# Patient Record
Sex: Male | Born: 1980 | Race: Black or African American | Hispanic: No | Marital: Single | Smoking: Current every day smoker
Health system: Southern US, Community
[De-identification: ages and names within clinical notes are randomized; demographics above are authoritative.]

## PROBLEM LIST (undated history)

## (undated) DIAGNOSIS — R569 Unspecified convulsions: Secondary | ICD-10-CM

## (undated) DIAGNOSIS — F819 Developmental disorder of scholastic skills, unspecified: Secondary | ICD-10-CM

## (undated) DIAGNOSIS — J302 Other seasonal allergic rhinitis: Secondary | ICD-10-CM

## (undated) HISTORY — PX: HAND SURGERY: SHX662

## (undated) HISTORY — DX: Other seasonal allergic rhinitis: J30.2

## (undated) HISTORY — PX: VASECTOMY: SHX75

## (undated) HISTORY — DX: Developmental disorder of scholastic skills, unspecified: F81.9

---

## 2006-01-06 ENCOUNTER — Emergency Department: Payer: Self-pay | Admitting: General Practice

## 2008-06-19 ENCOUNTER — Emergency Department: Payer: Self-pay | Admitting: Emergency Medicine

## 2008-07-09 ENCOUNTER — Emergency Department: Payer: Self-pay | Admitting: Emergency Medicine

## 2008-11-30 ENCOUNTER — Emergency Department: Payer: Self-pay | Admitting: Emergency Medicine

## 2012-01-09 ENCOUNTER — Emergency Department: Payer: Self-pay | Admitting: Emergency Medicine

## 2012-01-11 ENCOUNTER — Emergency Department: Payer: Self-pay | Admitting: Emergency Medicine

## 2012-01-14 ENCOUNTER — Emergency Department: Payer: Self-pay | Admitting: *Deleted

## 2012-08-24 ENCOUNTER — Emergency Department: Payer: Self-pay | Admitting: Internal Medicine

## 2012-08-24 LAB — COMPREHENSIVE METABOLIC PANEL
Anion Gap: 6 — ABNORMAL LOW (ref 7–16)
Calcium, Total: 9.2 mg/dL (ref 8.5–10.1)
Chloride: 108 mmol/L — ABNORMAL HIGH (ref 98–107)
Creatinine: 0.95 mg/dL (ref 0.60–1.30)
EGFR (African American): >60
EGFR (Non-African Amer.): >60
Potassium: 3.8 mmol/L (ref 3.5–5.1)
SGOT(AST): 27 U/L (ref 15–37)
Sodium: 142 mmol/L (ref 136–145)

## 2012-08-24 LAB — DRUG SCREEN, URINE
Amphetamines, Ur Screen: NEGATIVE (ref ?–1000)
Cocaine Metabolite,Ur ~~LOC~~: NEGATIVE (ref ?–300)
MDMA (Ecstasy)Ur Screen: NEGATIVE (ref ?–500)
Methadone, Ur Screen: NEGATIVE (ref ?–300)
Opiate, Ur Screen: NEGATIVE (ref ?–300)
Tricyclic, Ur Screen: NEGATIVE (ref ?–1000)

## 2012-08-24 LAB — ETHANOL
Ethanol %: <0.003 % (ref 0.000–0.080)
Ethanol: <3 mg/dL

## 2012-08-24 LAB — CBC
HCT: 43.9 % (ref 40.0–52.0)
HGB: 15.4 g/dL (ref 13.0–18.0)
MCH: 29.4 pg (ref 26.0–34.0)
MCHC: 35 g/dL (ref 32.0–36.0)
MCV: 84 fL (ref 80–100)
RDW: 12.3 % (ref 11.5–14.5)
WBC: 5.8 10*3/uL (ref 3.8–10.6)

## 2012-08-24 LAB — TROPONIN I: Troponin-I: <0.02 ng/mL

## 2013-09-11 ENCOUNTER — Ambulatory Visit: Payer: Self-pay | Admitting: Student

## 2013-09-11 ENCOUNTER — Emergency Department: Payer: Self-pay | Admitting: Emergency Medicine

## 2013-09-11 LAB — CBC
MCH: 29.5 pg (ref 26.0–34.0)
MCHC: 34.6 g/dL (ref 32.0–36.0)
MCV: 85 fL (ref 80–100)
Platelet: 250 10*3/uL (ref 150–440)
RBC: 4.69 10*6/uL (ref 4.40–5.90)
RDW: 12.5 % (ref 11.5–14.5)
WBC: 7.8 10*3/uL (ref 3.8–10.6)

## 2013-09-11 LAB — BASIC METABOLIC PANEL
Anion Gap: 4 — ABNORMAL LOW (ref 7–16)
Calcium, Total: 8.3 mg/dL — ABNORMAL LOW (ref 8.5–10.1)
Chloride: 111 mmol/L — ABNORMAL HIGH (ref 98–107)
Co2: 29 mmol/L (ref 21–32)
Creatinine: 1.24 mg/dL (ref 0.60–1.30)
EGFR (African American): >60
Osmolality: 286 (ref 275–301)
Potassium: 3.7 mmol/L (ref 3.5–5.1)
Sodium: 144 mmol/L (ref 136–145)

## 2013-10-10 ENCOUNTER — Emergency Department: Payer: Self-pay | Admitting: Emergency Medicine

## 2013-10-10 LAB — COMPREHENSIVE METABOLIC PANEL
AST: 22 U/L (ref 15–37)
Albumin: 4.3 g/dL (ref 3.4–5.0)
Alkaline Phosphatase: 104 U/L
Anion Gap: 5 — ABNORMAL LOW (ref 7–16)
BUN: 10 mg/dL (ref 7–18)
Bilirubin,Total: 0.4 mg/dL (ref 0.2–1.0)
Calcium, Total: 8.8 mg/dL (ref 8.5–10.1)
Chloride: 108 mmol/L — ABNORMAL HIGH (ref 98–107)
Co2: 26 mmol/L (ref 21–32)
Creatinine: 0.96 mg/dL (ref 0.60–1.30)
EGFR (Non-African Amer.): >60
Glucose: 82 mg/dL (ref 65–99)
OSMOLALITY: 276 (ref 275–301)
Potassium: 3.9 mmol/L (ref 3.5–5.1)
SGPT (ALT): 16 U/L (ref 12–78)
SODIUM: 139 mmol/L (ref 136–145)
Total Protein: 7.7 g/dL (ref 6.4–8.2)

## 2013-10-10 LAB — LIPASE, BLOOD: LIPASE: 122 U/L (ref 73–393)

## 2013-10-10 LAB — CBC
HCT: 40.2 % (ref 40.0–52.0)
HGB: 13.8 g/dL (ref 13.0–18.0)
MCH: 29.3 pg (ref 26.0–34.0)
MCHC: 34.4 g/dL (ref 32.0–36.0)
MCV: 85 fL (ref 80–100)
PLATELETS: 267 10*3/uL (ref 150–440)
RBC: 4.72 10*6/uL (ref 4.40–5.90)
RDW: 12.4 % (ref 11.5–14.5)
WBC: 3.5 10*3/uL — ABNORMAL LOW (ref 3.8–10.6)

## 2013-10-10 LAB — DRUG SCREEN, URINE

## 2013-10-10 LAB — TROPONIN I: Troponin-I: <0.02 ng/mL

## 2013-10-10 LAB — PHENYTOIN LEVEL, TOTAL: Dilantin: 2.6 ug/mL — ABNORMAL LOW (ref 10.0–20.0)

## 2013-12-31 ENCOUNTER — Emergency Department: Payer: Self-pay | Admitting: Internal Medicine

## 2013-12-31 LAB — CBC
HCT: 39.5 % — AB (ref 40.0–52.0)
HGB: 13.5 g/dL (ref 13.0–18.0)
MCH: 28.8 pg (ref 26.0–34.0)
MCHC: 34.1 g/dL (ref 32.0–36.0)
MCV: 85 fL (ref 80–100)
Platelet: 241 10*3/uL (ref 150–440)
RBC: 4.67 10*6/uL (ref 4.40–5.90)
RDW: 12.8 % (ref 11.5–14.5)
WBC: 5.4 10*3/uL (ref 3.8–10.6)

## 2013-12-31 LAB — COMPREHENSIVE METABOLIC PANEL
ALBUMIN: 4 g/dL (ref 3.4–5.0)
ALT: 18 U/L (ref 12–78)
Alkaline Phosphatase: 60 U/L
Anion Gap: 4 — ABNORMAL LOW (ref 7–16)
BUN: 9 mg/dL (ref 7–18)
Bilirubin,Total: 0.4 mg/dL (ref 0.2–1.0)
CHLORIDE: 107 mmol/L (ref 98–107)
CREATININE: 0.95 mg/dL (ref 0.60–1.30)
Calcium, Total: 8.6 mg/dL (ref 8.5–10.1)
Co2: 27 mmol/L (ref 21–32)
EGFR (African American): >60
EGFR (Non-African Amer.): >60
Glucose: 76 mg/dL (ref 65–99)
OSMOLALITY: 273 (ref 275–301)
Potassium: 3.8 mmol/L (ref 3.5–5.1)
SGOT(AST): 29 U/L (ref 15–37)
SODIUM: 138 mmol/L (ref 136–145)
Total Protein: 7.3 g/dL (ref 6.4–8.2)

## 2013-12-31 LAB — PHENYTOIN LEVEL, TOTAL: Dilantin: 0.6 ug/mL — ABNORMAL LOW (ref 10.0–20.0)

## 2013-12-31 LAB — ETHANOL
Ethanol %: <0.003 % (ref 0.000–0.080)
Ethanol: <3 mg/dL

## 2014-01-31 ENCOUNTER — Emergency Department: Payer: Self-pay | Admitting: Emergency Medicine

## 2014-01-31 LAB — COMPREHENSIVE METABOLIC PANEL
ALBUMIN: 4.3 g/dL (ref 3.4–5.0)
ALK PHOS: 74 U/L
Anion Gap: 3 — ABNORMAL LOW (ref 7–16)
BUN: 10 mg/dL (ref 7–18)
Bilirubin,Total: 0.2 mg/dL (ref 0.2–1.0)
CO2: 30 mmol/L (ref 21–32)
CREATININE: 1.16 mg/dL (ref 0.60–1.30)
Calcium, Total: 8.8 mg/dL (ref 8.5–10.1)
Chloride: 105 mmol/L (ref 98–107)
EGFR (African American): >60
EGFR (Non-African Amer.): >60
Glucose: 73 mg/dL (ref 65–99)
OSMOLALITY: 273 (ref 275–301)
Potassium: 3.8 mmol/L (ref 3.5–5.1)
SGOT(AST): 24 U/L (ref 15–37)
SGPT (ALT): 16 U/L (ref 12–78)
Sodium: 138 mmol/L (ref 136–145)
TOTAL PROTEIN: 7.5 g/dL (ref 6.4–8.2)

## 2014-01-31 LAB — CBC WITH DIFFERENTIAL/PLATELET
BASOS ABS: 0 10*3/uL (ref 0.0–0.1)
Basophil %: 1.1 %
Eosinophil #: 0.2 10*3/uL (ref 0.0–0.7)
Eosinophil %: 5.5 %
HCT: 39.3 % — ABNORMAL LOW (ref 40.0–52.0)
HGB: 13.2 g/dL (ref 13.0–18.0)
LYMPHS ABS: 1.2 10*3/uL (ref 1.0–3.6)
LYMPHS PCT: 35 %
MCH: 28.4 pg (ref 26.0–34.0)
MCHC: 33.5 g/dL (ref 32.0–36.0)
MCV: 85 fL (ref 80–100)
Monocyte #: 0.4 x10 3/mm (ref 0.2–1.0)
Monocyte %: 10.2 %
NEUTROS PCT: 48.2 %
Neutrophil #: 1.7 10*3/uL (ref 1.4–6.5)
PLATELETS: 215 10*3/uL (ref 150–440)
RBC: 4.64 10*6/uL (ref 4.40–5.90)
RDW: 12.8 % (ref 11.5–14.5)
WBC: 3.5 10*3/uL — ABNORMAL LOW (ref 3.8–10.6)

## 2014-01-31 LAB — PHENYTOIN LEVEL, TOTAL: Dilantin: <0.4 ug/mL — ABNORMAL LOW (ref 10.0–20.0)

## 2014-02-21 ENCOUNTER — Emergency Department: Payer: Self-pay | Admitting: Emergency Medicine

## 2014-02-21 LAB — COMPREHENSIVE METABOLIC PANEL
ALBUMIN: 3.6 g/dL (ref 3.4–5.0)
ANION GAP: 7 (ref 7–16)
Alkaline Phosphatase: 59 U/L
BUN: 10 mg/dL (ref 7–18)
Bilirubin,Total: 0.5 mg/dL (ref 0.2–1.0)
CALCIUM: 8.6 mg/dL (ref 8.5–10.1)
Chloride: 107 mmol/L (ref 98–107)
Co2: 25 mmol/L (ref 21–32)
Creatinine: 0.9 mg/dL (ref 0.60–1.30)
EGFR (African American): >60
EGFR (Non-African Amer.): >60
Glucose: 101 mg/dL — ABNORMAL HIGH (ref 65–99)
Osmolality: 277 (ref 275–301)
POTASSIUM: 3.5 mmol/L (ref 3.5–5.1)
SGOT(AST): 21 U/L (ref 15–37)
SGPT (ALT): 16 U/L (ref 12–78)
Sodium: 139 mmol/L (ref 136–145)
TOTAL PROTEIN: 6.6 g/dL (ref 6.4–8.2)

## 2014-02-21 LAB — CBC
HCT: 37.3 % — ABNORMAL LOW (ref 40.0–52.0)
HGB: 12.5 g/dL — ABNORMAL LOW (ref 13.0–18.0)
MCH: 28.6 pg (ref 26.0–34.0)
MCHC: 33.5 g/dL (ref 32.0–36.0)
MCV: 85 fL (ref 80–100)
Platelet: 216 10*3/uL (ref 150–440)
RBC: 4.37 10*6/uL — ABNORMAL LOW (ref 4.40–5.90)
RDW: 12.8 % (ref 11.5–14.5)
WBC: 2.6 10*3/uL — ABNORMAL LOW (ref 3.8–10.6)

## 2014-02-21 LAB — PHENYTOIN LEVEL, TOTAL: Dilantin: 0.9 ug/mL — ABNORMAL LOW (ref 10.0–20.0)

## 2014-10-14 ENCOUNTER — Emergency Department (HOSPITAL_COMMUNITY)
Admission: EM | Admit: 2014-10-14 | Discharge: 2014-10-14 | Disposition: A | Discharge status: Home / Self Care | Payer: Self-pay | Attending: Emergency Medicine | Admitting: Emergency Medicine

## 2014-10-14 ENCOUNTER — Encounter (HOSPITAL_COMMUNITY): Payer: Self-pay | Admitting: *Deleted

## 2014-10-14 DIAGNOSIS — R569 Unspecified convulsions: Secondary | ICD-10-CM | POA: Insufficient documentation

## 2014-10-14 DIAGNOSIS — Z87891 Personal history of nicotine dependence: Secondary | ICD-10-CM | POA: Insufficient documentation

## 2014-10-14 HISTORY — DX: Unspecified convulsions: R56.9

## 2014-10-14 LAB — BASIC METABOLIC PANEL
ANION GAP: 6 (ref 5–15)
BUN: 9 mg/dL (ref 6–23)
CO2: 28 mmol/L (ref 19–32)
CREATININE: 1.1 mg/dL (ref 0.50–1.35)
Calcium: 9.4 mg/dL (ref 8.4–10.5)
Chloride: 104 mmol/L (ref 96–112)
GFR calc Af Amer: >90 mL/min (ref >90)
GFR calc non Af Amer: 87 mL/min — ABNORMAL LOW (ref >90)
GLUCOSE: 92 mg/dL (ref 70–99)
Potassium: 3.9 mmol/L (ref 3.5–5.1)
Sodium: 138 mmol/L (ref 135–145)

## 2014-10-14 LAB — CBC WITH DIFFERENTIAL/PLATELET
Basophils Absolute: 0 10*3/uL (ref 0.0–0.1)
Basophils Relative: 1 % (ref 0–1)
Eosinophils Absolute: 0.2 10*3/uL (ref 0.0–0.7)
Eosinophils Relative: 4 % (ref 0–5)
HCT: 36.6 % — ABNORMAL LOW (ref 39.0–52.0)
Hemoglobin: 12.5 g/dL — ABNORMAL LOW (ref 13.0–17.0)
Lymphocytes Relative: 41 % (ref 12–46)
Lymphs Abs: 1.7 10*3/uL (ref 0.7–4.0)
MCH: 28.3 pg (ref 26.0–34.0)
MCHC: 34.2 g/dL (ref 30.0–36.0)
MCV: 83 fL (ref 78.0–100.0)
Monocytes Absolute: 0.4 10*3/uL (ref 0.1–1.0)
Monocytes Relative: 10 % (ref 3–12)
Neutro Abs: 1.8 10*3/uL (ref 1.7–7.7)
Neutrophils Relative %: 44 % (ref 43–77)
Platelets: 236 10*3/uL (ref 150–400)
RBC: 4.41 MIL/uL (ref 4.22–5.81)
RDW: 11.7 % (ref 11.5–15.5)
WBC: 4 10*3/uL (ref 4.0–10.5)

## 2014-10-14 MED ORDER — SODIUM CHLORIDE 0.9 % IV BOLUS (SEPSIS)
1000.0000 mL | Freq: Once | INTRAVENOUS | Status: AC
  Administered 2014-10-14: 1000 mL via INTRAVENOUS

## 2014-10-14 MED ORDER — PHENYTOIN SODIUM EXTENDED 100 MG PO CAPS: 100.0000 mg | ORAL_CAPSULE | Freq: Three times a day (TID) | ORAL | Status: DC

## 2014-10-14 MED ORDER — SODIUM CHLORIDE 0.9 % IV SOLN
1000.0000 mg | Freq: Once | INTRAVENOUS | Status: AC
  Administered 2014-10-14: 1000 mg via INTRAVENOUS
  Filled 2014-10-14: qty 20

## 2014-10-14 NOTE — ED Provider Notes (Signed)
CSN: [id number]     Arrival date & time 10/14/14  1559 History   First MD Initiated Contact with Patient 10/14/14 1603     Chief Complaint  Patient presents with  . Seizures     HPI  Patient presents after a witnessed seizure. Patient states that he has been doing generally well.  He does acknowledge occasional black stool, though none today. Today, the patient drank 3 monster energy drinks, one cup of coffee. Subsequently, the patient had a typical prodrome, then a 10 minute seizure. Patient had a slightly more prolonged postictal phase, but otherwise, the seizure was characteristically typical for him, according to the patient's fianc. Currently the patient describes mild tenderness, without confusion, disorientation, tongue pain, weakness anywhere. He acknowledges a seizure history, states that he has been unable to obtain medication for at least 6 months. He denies other medical problems.   Past Medical History  Diagnosis Date  . Seizures    Past Surgical History  Procedure Laterality Date  . Vasectomy Bilateral    History reviewed. No pertinent family history. History  Substance Use Topics  . Smoking status: Former Smoker    Quit date: 07/18/2014  . Smokeless tobacco: Not on file  . Alcohol Use: Yes     Comment: socially    Review of Systems  Constitutional:       Per HPI, otherwise negative  HENT:       Per HPI, otherwise negative  Respiratory:       Per HPI, otherwise negative  Cardiovascular:       Per HPI, otherwise negative  Gastrointestinal: Negative for vomiting.  Endocrine:       Negative aside from HPI  Genitourinary:       Neg aside from HPI   Musculoskeletal:       Per HPI, otherwise negative  Skin: Negative.   Neurological: Positive for seizures. Negative for syncope.      Allergies  Review of patient's allergies indicates no known allergies.  Home Medications   Prior to Admission medications   Not on File   BP 131/94 mmHg  Pulse  90  Resp 20  SpO2 98% Physical Exam  Constitutional: He is oriented to person, place, and time. He appears well-developed. No distress.  HENT:  Head: Normocephalic and atraumatic.  Eyes: Conjunctivae and EOM are normal.  Pulmonary/Chest: Effort normal. No stridor. No respiratory distress.  Abdominal: He exhibits no distension.  Musculoskeletal: He exhibits no edema.  Neurological: He is alert and oriented to person, place, and time. No cranial nerve deficit. He exhibits normal muscle tone. Coordination normal.  Skin: Skin is warm and dry.  Psychiatric: He has a normal mood and affect.  Nursing note and vitals reviewed.   ED Course  Procedures (including critical care time) Labs Review Labs Reviewed  BASIC METABOLIC PANEL - Abnormal; Notable for the following:    GFR calc non Af Amer 87 (*)    All other components within normal limits  CBC WITH DIFFERENTIAL/PLATELET - Abnormal; Notable for the following:    Hemoglobin 12.5 (*)    HCT 36.6 (*)    All other components within normal limits      EKG Interpretation   Date/Time:  Thursday October 14 2014 16:02:27 EST Ventricular Rate:  69 PR Interval:  157 QRS Duration: 109 QT Interval:  387 QTC Calculation: 415 R Axis:   87 Text Interpretation:  Sinus rhythm Anterior infarct, possibly acute ST  elevation, consider inferior injury Lateral leads  are also involved  Baseline wander in lead(s) V3 V4 Sinus rhythm Artifact Abnormal ekg  Confirmed by Gerhard Munch  MD [phone]) on 10/14/2014 4:08:32 PM     On repeat exam the patient is in no distress.  Labs reviewed.  Mild anemia - PCP and GI f/u provided MDM  Patient presents after a witnessed seizure.  Patient has a history of seizures, has not taken any medication recently. With the patient's acknowledgment of recent black stool, labs were performed in addition to fluid resuscitation, loading of Dilantin. Patient was provided  Prescriptions, follow-up to ensure that he can  obtain his medication, and primary care resources.   Gerhard Munch, MD 10/14/14 1810

## 2014-10-14 NOTE — Discharge Instructions (Signed)
As discussed, your evaluation today has been largely reassuring.  But, it is important that you monitor your condition carefully, and do not hesitate to return to the ED if you develop new, or concerning changes in your condition.  Please take all medication as directed.  With your physician, please discuss both your seizures, and your borderline anemia.  Otherwise, please follow-up with your physicians (primary care and gastroenterology) for appropriate ongoing care.   Seizure, Adult A seizure is abnormal electrical activity in the brain. Seizures usually last from 30 seconds to 2 minutes. There are various types of seizures. Before a seizure, you may have a warning sensation (aura) that a seizure is about to occur. An aura may include the following symptoms:   Fear or anxiety.  Nausea.  Feeling like the room is spinning (vertigo).  Vision changes, such as seeing flashing lights or spots. Common symptoms during a seizure include:  A change in attention or behavior (altered mental status).  Convulsions with rhythmic jerking movements.  Drooling.  Rapid eye movements.  Grunting.  Loss of bladder and bowel control.  Bitter taste in the mouth.  Tongue biting. After a seizure, you may feel confused and sleepy. You may also have an injury resulting from convulsions during the seizure. HOME CARE INSTRUCTIONS   If you are given medicines, take them exactly as prescribed by your health care provider.  Keep all follow-up appointments as directed by your health care provider.  Do not swim or drive or engage in risky activity during which a seizure could cause further injury to you or others until your health care provider says it is OK.  Get adequate rest.  Teach friends and family what to do if you have a seizure. They should:  Lay you on the ground to prevent a fall.  Put a cushion under your head.  Loosen any tight clothing around your neck.  Turn you on your side. If  vomiting occurs, this helps keep your airway clear.  Stay with you until you recover.  Know whether or not you need emergency care. SEEK IMMEDIATE MEDICAL CARE IF:  The seizure lasts longer than 5 minutes.  The seizure is severe or you do not wake up immediately after the seizure.  You have an altered mental status after the seizure.  You are having more frequent or worsening seizures. Someone should drive you to the emergency department or call local emergency services (911 in [place] MAKE SURE YOU:  Understand these instructions.  Will watch your condition.  Will get help right away if you are not doing well or get worse. Document Released: 08/31/2000 Document Revised: 06/24/2013 Document Reviewed: 04/15/2013 [facility] Patient Information 2015 [place]. This information is not intended to replace advice given to you by your health care provider. Make sure you discuss any questions you have with your health care provider.
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
## 2014-10-14 NOTE — Progress Notes (Signed)
  CARE MANAGEMENT ED NOTE 10/14/2014  Patient:  Idelle CrouchRICHMOND,Jatorian C   Account Number:  [id number]  Date Initiated:  10/14/2014  Documentation initiated by:  Radford PaxFERRERO,Haillee Johann  Subjective/Objective Assessment:   Patient presents to Ed post seizure.     Subjective/Objective Assessment Detail:   Patient with pmhx of seizures     Action/Plan:   Action/Plan Detail:   Anticipated DC Date:  10/14/2014     Status Recommendation to Physician:   Result of Recommendation:    Other ED Services  Consult Working Plan    DC Planning Services  Other  PCP issues    Choice offered to / List presented to:            Status of service:  Completed, signed off  ED Comments:   ED Comments Detail:  EDCM spoke to patient at bedside.  Patient reports he does not have a pcp, insurnace or a neurologist.  Patient reports he hasn't taken his dilantin in six months, "Because I haven't had the funds."  Patient has just started a job, and will be receiving insurance in 90 days. Patient's fiance' at bedside, Marcelle Smilingatasha [phone]. Patient nor his fiance' are able to tell [facility] exact dosage of last Dilantin prescription.  However, patient's fiance' reports he last [facility] [place].  EDCM received patient's permission to call [facility].  EDCM [facility] [place] [facility] at [facility] who confirms patient [facility] [place] for Dilantin 100mg  3 capsules at bedtime.  Last prescription was filled December 30, 2013.  Discussed patient with Dr. Jeraldine LootsLockwood and EDRN.  Patient [facility]able for [facility] to [facility] in attempts to make him an appointment.  EDCM also provided patient with pamphlet to [facility], list of discounted pharmacies and websites needymeds.org and Good [email or web address].[email or web address] for medication assistance.  Patient and patient's fiance' thankful for services.  No further needs at this time.

## 2014-10-14 NOTE — ED Notes (Signed)
Bed: WA10 Expected date:  Expected time:  Means of arrival:  Comments: EMS 

## 2014-10-14 NOTE — Progress Notes (Signed)
10/14/2014 A. Daryle Amis RNCM 1829pm EDCM provided patient with discount coupon [facility]  Patient agreeable to copay.  No further EDCM needs at this time.

## 2014-10-15 LAB — CBG MONITORING, ED: GLUCOSE-CAPILLARY: 90 mg/dL (ref 70–99)

## 2014-11-25 ENCOUNTER — Emergency Department: Payer: Self-pay | Admitting: Emergency Medicine

## 2014-11-27 ENCOUNTER — Emergency Department (HOSPITAL_COMMUNITY)
Admission: EM | Admit: 2014-11-27 | Discharge: 2014-11-27 | Disposition: A | Discharge status: Home / Self Care | Payer: Self-pay | Attending: Emergency Medicine | Admitting: Emergency Medicine

## 2014-11-27 ENCOUNTER — Encounter (HOSPITAL_COMMUNITY): Payer: Self-pay | Admitting: *Deleted

## 2014-11-27 DIAGNOSIS — R569 Unspecified convulsions: Secondary | ICD-10-CM

## 2014-11-27 DIAGNOSIS — G43909 Migraine, unspecified, not intractable, without status migrainosus: Secondary | ICD-10-CM | POA: Insufficient documentation

## 2014-11-27 DIAGNOSIS — Z87891 Personal history of nicotine dependence: Secondary | ICD-10-CM | POA: Insufficient documentation

## 2014-11-27 DIAGNOSIS — Z79899 Other long term (current) drug therapy: Secondary | ICD-10-CM | POA: Insufficient documentation

## 2014-11-27 LAB — BASIC METABOLIC PANEL
Anion gap: 7 (ref 5–15)
BUN: 11 mg/dL (ref 6–23)
CO2: 27 mmol/L (ref 19–32)
Calcium: 8.8 mg/dL (ref 8.4–10.5)
Chloride: 105 mmol/L (ref 96–112)
Creatinine, Ser: 1.14 mg/dL (ref 0.50–1.35)
GFR calc non Af Amer: 84 mL/min — ABNORMAL LOW (ref >90)
GLUCOSE: 90 mg/dL (ref 70–99)
Potassium: 4.4 mmol/L (ref 3.5–5.1)
Sodium: 139 mmol/L (ref 135–145)

## 2014-11-27 LAB — CBC
HCT: 37.7 % — ABNORMAL LOW (ref 39.0–52.0)
HEMOGLOBIN: 13.1 g/dL (ref 13.0–17.0)
MCH: 28.9 pg (ref 26.0–34.0)
MCHC: 34.7 g/dL (ref 30.0–36.0)
MCV: 83.2 fL (ref 78.0–100.0)
Platelets: 203 10*3/uL (ref 150–400)
RBC: 4.53 MIL/uL (ref 4.22–5.81)
RDW: 11.9 % (ref 11.5–15.5)
WBC: 3.5 10*3/uL — ABNORMAL LOW (ref 4.0–10.5)

## 2014-11-27 LAB — PHENYTOIN LEVEL, TOTAL: Phenytoin Lvl: 19.5 ug/mL (ref 10.0–20.0)

## 2014-11-27 NOTE — ED Provider Notes (Signed)
CSN: [id number]     Arrival date & time 11/27/14  1126 History   First MD Initiated Contact with Patient 11/27/14 1128     Chief Complaint  Patient presents with  . Seizures     (Consider location/radiation/quality/duration/timing/severity/associated sxs/prior Treatment) Patient is a 34 y.o. male presenting with seizures. The history is provided by the patient.  Seizures Seizure activity on arrival: no   Seizure type:  Grand mal Preceding symptoms: aura and dizziness   Initial focality:  None Episode characteristics: no apnea, no focal shaking, no generalized shaking and responsive   Postictal symptoms: no confusion   Severity:  Mild Timing:  Once Context: not alcohol withdrawal, not cerebral palsy, not fever and not previous head injury     Past Medical History  Diagnosis Date  . Seizures    Past Surgical History  Procedure Laterality Date  . Vasectomy Bilateral    No family history on file. History  Substance Use Topics  . Smoking status: Former Smoker    Quit date: 07/18/2014  . Smokeless tobacco: Not on file  . Alcohol Use: Yes     Comment: socially    Review of Systems  Constitutional: Negative for fever.  Respiratory: Negative for cough and shortness of breath.   Cardiovascular: Negative for chest pain and leg swelling.  Gastrointestinal: Negative for vomiting and abdominal pain.  Neurological: Positive for seizures.  All other systems reviewed and are negative.     Allergies  Review of patient's allergies indicates no known allergies.  Home Medications   Prior to Admission medications   Medication Sig Start Date End Date Taking? Authorizing Provider  phenytoin (DILANTIN) 100 MG ER capsule Take 1 capsule (100 mg total) by mouth 3 (three) times daily. 10/14/14  Yes Gerhard Munch, MD   BP 132/97 mmHg  Pulse 72  Temp(Src) 97.8 F (36.6 C) (Oral)  Resp 16  SpO2 100% Physical Exam  Constitutional: He is oriented to person, place, and time. He  appears well-developed and well-nourished. No distress.  HENT:  Head: Normocephalic and atraumatic.  Mouth/Throat: No oropharyngeal exudate.  Eyes: EOM are normal. Pupils are equal, round, and reactive to light.  Neck: Normal range of motion. Neck supple.  Cardiovascular: Normal rate and regular rhythm.  Exam reveals no friction rub.   No murmur heard. Pulmonary/Chest: Effort normal and breath sounds normal. No respiratory distress. He has no wheezes. He has no rales.  Abdominal: He exhibits no distension. There is no tenderness. There is no rebound.  Musculoskeletal: Normal range of motion. He exhibits no edema.  Neurological: He is alert and oriented to person, place, and time. A sensory deficit (altered light touch on R leg) is present. No cranial nerve deficit. He exhibits normal muscle tone. Coordination and gait normal. GCS eye subscore is 4. GCS verbal subscore is 5. GCS motor subscore is 6.  Skin: He is not diaphoretic.  Nursing note and vitals reviewed.   ED Course  Procedures (including critical care time) Labs Review Labs Reviewed  CBC  BASIC METABOLIC PANEL  PHENYTOIN LEVEL, TOTAL    Imaging Review No results found.   EKG Interpretation   Date/Time:  Saturday November 27 2014 11:52:47 EST Ventricular Rate:  65 PR Interval:  150 QRS Duration: 106 QT Interval:  398 QTC Calculation: 414 R Axis:   78 Text Interpretation:  Sinus rhythm ST elev, probable normal early repol  pattern Similar to prior Confirmed by [facility]  MD, Shrihan Putt ([id number]) on  11/27/2014 11:59:56 AM  MDM   Final diagnoses:  Seizure    76M here with seizures. Hx of chronic seizures. On/off dilantin intermittently. Just restarted dilantin 2 days ago after having 2 seizures. Had another 2 grand mal seizures today. No fevers, vomiting.  Seizure frequency prior to going back on dilantin was about once every 3 months. AFVSS here. Dilantin level therapeutic. Other labs ok. Given Neuro f/u. Stable for  discharge.    Elwin MochaBlair Glenda Kunst, MD 11/27/14 [phone]

## 2014-11-27 NOTE — ED Notes (Signed)
Bed: WA10 Expected date: 11/27/14 Expected time: 11:26 AM Means of arrival: Ambulance Comments: Seizure

## 2014-11-27 NOTE — ED Notes (Signed)
Per EMS pt with hx of seizures, had two prior to arrival. EMS upon their arrival pt was A+O x 4. Pt was ambulatory to department.

## 2014-11-27 NOTE — Discharge Instructions (Signed)
Nonepileptic Seizures °Nonepileptic seizures are seizures that are not caused by abnormal electrical signals in your brain. These seizures often seem like epileptic seizures, but they are not caused by epilepsy.  °There are two types of nonepileptic seizures: °· A physiologic nonepileptic seizure results from a disruption in your brain. °· A psychogenic seizure results from emotional stress. These seizures are sometimes called pseudoseizures. °CAUSES  °Causes of physiologic nonepileptic seizures include:  °· Sudden drop in blood pressure. °· Low blood sugar. °· Low levels of salt (sodium) in your blood. °· Low levels of calcium in your blood. °· Migraine. °· Heart rhythm problems. °· Sleep disorders. °· Drug and alcohol abuse. °Common causes of psychogenic nonepileptic seizures include: °· Stress. °· Emotional trauma. °· Sexual or physical abuse. °· Major life events, such as divorce or the death of a loved one. °· Mental health disorders, including panic attack and hyperactivity disorder. °SIGNS AND SYMPTOMS °A nonepileptic seizure can look like an epileptic seizure, including uncontrollable shaking (convulsions), or changes in attention, behavior, or the ability to remain awake and alert. However, there are some differences. Nonepileptic seizures usually: °· Do not cause physical injuries. °· Start slowly. °· Include crying or shrieking. °· Last longer than 2 minutes. °· Have a short recovery time without headache or exhaustion. °DIAGNOSIS  °Your health care provider can usually diagnose nonepileptic seizures after taking your medical history and giving you a physical exam. Your health care provider may want to talk to your friends or relatives who have seen you have a seizure.  °You may also need to have tests to look for causes of physiologic nonepileptic seizures. This may include an electroencephalogram (EEG), which is a test that measures electrical activity in your brain. If you have had an epileptic  seizure, the results of your EEG will be abnormal. If your health care provider thinks you have had a psychogenic nonepileptic seizure, you may need to see a mental health specialist for an evaluation. °TREATMENT  °Treatment depends on the type and cause of your seizures. °· For physiologic nonepileptic seizures, treatment is aimed at addressing the underlying condition that caused the seizures. These seizures usually stop when the underlying condition is properly treated. °· Nonepileptic seizures do not respond to the seizure medicines used to treat epilepsy. °· For psychogenic seizures, you may need to work with a mental health specialist. °HOME CARE INSTRUCTIONS °Home care will depend on the type of nonepileptic seizures you have.  °· Follow all your health care provider's instructions. °· Keep all your follow-up appointments. °SEEK MEDICAL CARE IF: °You continue to have seizures after treatment. °SEEK IMMEDIATE MEDICAL CARE IF: °· Your seizures change or become more frequent. °· You injure yourself during a seizure. °· You have one seizure after another. °· You have trouble recovering from a seizure. °· You have chest pain or trouble breathing. °MAKE SURE YOU: °· Understand these instructions. °· Will watch your condition. °· Will get help right away if you are not doing well or get worse. °Document Released: 10/19/2005 Document Revised: 01/18/2014 Document Reviewed: 06/30/2013 °ExitCare® Patient Information ©2015 ExitCare, LLC. This information is not intended to replace advice given to you by your health care provider. Make sure you discuss any questions you have with your health care provider. ° °

## 2014-12-07 ENCOUNTER — Ambulatory Visit: Payer: Self-pay | Admitting: Neurology

## 2014-12-08 ENCOUNTER — Encounter: Payer: Self-pay | Admitting: Neurology
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
## 2015-05-11 ENCOUNTER — Emergency Department (HOSPITAL_COMMUNITY)
Admission: EM | Admit: 2015-05-11 | Discharge: 2015-05-11 | Disposition: A | Discharge status: Home / Self Care | Payer: BLUE CROSS/BLUE SHIELD | Attending: Emergency Medicine | Admitting: Emergency Medicine

## 2015-05-11 ENCOUNTER — Encounter (HOSPITAL_COMMUNITY): Payer: Self-pay | Admitting: *Deleted

## 2015-05-11 DIAGNOSIS — Z79899 Other long term (current) drug therapy: Secondary | ICD-10-CM | POA: Diagnosis not present

## 2015-05-11 DIAGNOSIS — Z87891 Personal history of nicotine dependence: Secondary | ICD-10-CM | POA: Insufficient documentation

## 2015-05-11 DIAGNOSIS — K0381 Cracked tooth: Secondary | ICD-10-CM | POA: Insufficient documentation

## 2015-05-11 DIAGNOSIS — K029 Dental caries, unspecified: Secondary | ICD-10-CM | POA: Diagnosis not present

## 2015-05-11 DIAGNOSIS — K088 Other specified disorders of teeth and supporting structures: Secondary | ICD-10-CM | POA: Diagnosis present

## 2015-05-11 DIAGNOSIS — K625 Hemorrhage of anus and rectum: Secondary | ICD-10-CM | POA: Insufficient documentation

## 2015-05-11 DIAGNOSIS — G40909 Epilepsy, unspecified, not intractable, without status epilepticus: Secondary | ICD-10-CM | POA: Diagnosis not present

## 2015-05-11 DIAGNOSIS — K0889 Other specified disorders of teeth and supporting structures: Secondary | ICD-10-CM

## 2015-05-11 MED ORDER — LEVETIRACETAM 500 MG PO TABS: 500.0000 mg | ORAL_TABLET | Freq: Two times a day (BID) | ORAL | Status: DC

## 2015-05-11 MED ORDER — LORAZEPAM 1 MG PO TABS
1.0000 mg | ORAL_TABLET | Freq: Once | ORAL | Status: AC
  Administered 2015-05-11: 1 mg via ORAL
  Filled 2015-05-11: qty 1

## 2015-05-11 MED ORDER — PENICILLIN V POTASSIUM 500 MG PO TABS: 500.0000 mg | ORAL_TABLET | Freq: Four times a day (QID) | ORAL | Status: DC

## 2015-05-11 MED ORDER — LEVETIRACETAM 500 MG PO TABS
1000.0000 mg | ORAL_TABLET | Freq: Once | ORAL | Status: AC
  Administered 2015-05-11: 1000 mg via ORAL
  Filled 2015-05-11: qty 2

## 2015-05-11 NOTE — ED Notes (Signed)
Pt d/c'd on downtime.  

## 2015-05-11 NOTE — ED Notes (Signed)
Per EDPA pt describing dizziness/head pain at this time which is typical of his seizures.  No sz activity noted.  Pt denies other complaints at this time.  Will CTM.

## 2015-05-11 NOTE — ED Notes (Signed)
Pt complains of left lower toothache, lightheadedness since this morning. Pt states he noticed he was spitting blood this morning, pt is unsure if the blood is from his tooth.

## 2015-05-11 NOTE — ED Provider Notes (Signed)
CSN: [id number]     Arrival date & time 05/11/15  1218 History  This chart was scribed for non-physician practitioner, Trixie Dredge, PA-C working with Arby Barrette, MD by Placido Sou, ED scribe. This patient was seen in room WTR5/WTR5 and the patient's care was started at 2:11 PM.   Chief Complaint  Patient presents with  . Dental Pain   The history is provided by the patient and the spouse. No language interpreter was used.    HPI Comments: Keith Chung is a 34 y.o. male, with a hx of epilepsy, who presents to the Emergency Department complaining of chronic, moderate, left lower dental pain with worsening symptoms beginning this morning.   He notes 1x spitting blood out of his mouth this morning with associated increased left lower molar pain, followed by a feeling of mild lightheadedness and HA this morning which he says are typical before he has seizures. Pt notes that he has experienced seizures since he was 34 years old, had his last episode 2 months ago which involved some mild "eye fluttering" and drooling and further notes that he hasn't seen a neurologist for his symptoms. His wife also notes that sometimes his full body will convulse. He notes taking dilantin for his seizures but only takes it every third day because it makes him too sleepy to work - he works in [employer].  He denies facial swelling, fevers, chills, difficulty swallowing, CP, SOB, cough, hemoptysis, hematuria and abd pain.   Past Medical History  Diagnosis Date  . Seizures    Past Surgical History  Procedure Laterality Date  . Vasectomy Bilateral    No family history on file. Social History  Substance Use Topics  . Smoking status: Former Smoker    Quit date: 07/18/2014  . Smokeless tobacco: None  . Alcohol Use: Yes     Comment: socially    Review of Systems  Constitutional: Negative for fever and chills.  HENT: Positive for dental problem. Negative for facial swelling and trouble swallowing.    Respiratory: Negative for cough and shortness of breath.   Cardiovascular: Negative for chest pain.  Gastrointestinal: Positive for anal bleeding (occasional blood on toilet paper only). Negative for abdominal pain.  Genitourinary: Negative for hematuria.  Neurological: Positive for light-headedness and headaches.  All other systems reviewed and are negative.   Allergies  Review of patient's allergies indicates no known allergies.  Home Medications   Prior to Admission medications   Medication Sig Start Date End Date Taking? Authorizing Provider  phenytoin (DILANTIN) 100 MG ER capsule Take 1 capsule (100 mg total) by mouth 3 (three) times daily. 10/14/14   Gerhard Munch, MD   BP 142/84 mmHg  Pulse 63  Temp(Src) 98.3 F (36.8 C) (Oral)  Resp 18  SpO2 97% Physical Exam  Constitutional: He appears well-developed and well-nourished. No distress.  HENT:  Head: Normocephalic and atraumatic.  Mouth/Throat: Uvula is midline and oropharynx is clear and moist. Mucous membranes are not dry. No uvula swelling. No oropharyngeal exudate, posterior oropharyngeal edema, posterior oropharyngeal erythema or tonsillar abscesses.  Left lower 1st molar with remote fracture and decay; left lower 2nd and 3rd molar with severe decay down to the gumline; no erythema, edema or fluctuance associated with gingiva or adjacent to teeth; no facial swelling  Neck: Normal range of motion. Neck supple.  Cardiovascular: Normal rate and regular rhythm.   Pulmonary/Chest: Effort normal and breath sounds normal. No stridor. No respiratory distress. He has no wheezes. He has  no rales.  Abdominal: Soft. He exhibits no distension and no mass. There is no tenderness. There is no rebound and no guarding.  Lymphadenopathy:    He has no cervical adenopathy.  Neurological: He is alert. He exhibits normal muscle tone.  CN II-XII intact, EOMs intact, no pronator drift, grip strengths equal bilaterally; strength 5/5 in all  extremities, sensation intact in all extremities; finger to nose, heel to shin, rapid alternating movements normal; gait is normal.    Skin: He is not diaphoretic.  Nursing note and vitals reviewed.  ED Course  Procedures  DIAGNOSTIC STUDIES: Oxygen Saturation is 97% on RA, normal by my interpretation.    COORDINATION OF CARE: 2:26 PM Discussed treatment plan with pt at bedside and pt agreed to plan.  Labs Review Labs Reviewed - No data to display  Imaging Review No results found. I have personally reviewed and evaluated these images and lab results as part of my medical decision-making.   EKG Interpretation None       2:46 PM I spoke with Dr Roseanne Reno (neurology) who agrees with change from dilantin to keppra.  Will give 1000mg  PO in ED and pt to start 500mg  BID starting tonight per his recommendations.  Pt will also be given neurology follow up.   MDM   Final diagnoses:  Pain, dental  Seizure disorder    Afebrile, nontoxic patient with new dental pain.  No obvious abscess.  No concerning findings on exam. Doubt deep space head or neck infection.  Doubt Ludwig's angina.  Pt also with aura for seizure - no recent seizures.  Does not take medication as directed because it makes him sleepy and he works in [employer].  Switched to Keppra per discussion with Dr Roseanne Reno, see above.  D/C home with antibiotic, Keppra, (D/C Dilantin) and dental, neurology follow up.  Discussed findings, treatment, and follow up  with patient.  Pt given return precautions.  Pt verbalizes understanding and agrees with plan.       I personally performed the services described in this documentation, which was scribed in my presence. The recorded information has been reviewed and is accurate.    Trixie Dredge, PA-C 05/11/15 1740  Arby Barrette, MD 05/25/15 [phone]

## 2015-05-11 NOTE — Discharge Instructions (Signed)
Read the information below.  Use the prescribed medication as directed.  Please discuss all new medications with your pharmacist.  You may return to the Emergency Department at any time for worsening condition or any new symptoms that concern you.  Please call the dentist listed above within 48 hours to schedule a close follow up appointment.  If you develop fevers, swelling in your face, difficulty swallowing or breathing, return to the ER immediately for a recheck.    Dental Pain A tooth ache may be caused by cavities (tooth decay). Cavities expose the nerve of the tooth to air and hot or cold temperatures. It may come from an infection or abscess (also called a boil or furuncle) around your tooth. It is also often caused by dental caries (tooth decay). This causes the pain you are having. DIAGNOSIS  Your caregiver can diagnose this problem by exam. TREATMENT   If caused by an infection, it may be treated with medications which kill germs (antibiotics) and pain medications as prescribed by your caregiver. Take medications as directed.  Only take over-the-counter or prescription medicines for pain, discomfort, or fever as directed by your caregiver.  Whether the tooth ache today is caused by infection or dental disease, you should see your dentist as soon as possible for further care. SEEK MEDICAL CARE IF: The exam and treatment you received today has been provided on an emergency basis only. This is not a substitute for complete medical or dental care. If your problem worsens or new problems (symptoms) appear, and you are unable to meet with your dentist, call or return to this location. SEEK IMMEDIATE MEDICAL CARE IF:   You have a fever.  You develop redness and swelling of your face, jaw, or neck.  You are unable to open your mouth.  You have severe pain uncontrolled by pain medicine. MAKE SURE YOU:   Understand these instructions.  Will watch your condition.  Will get help right  away if you are not doing well or get worse. Document Released: 09/03/2005 Document Revised: 11/26/2011 Document Reviewed: 04/21/2008 [facility] Patient Information 2015 [place], [place]. This information is not intended to replace advice given to you by your health care provider. Make sure you discuss any questions you have with your health care provider.  Epilepsy Epilepsy is a disorder in which a person has repeated seizures over time. A seizure is a release of abnormal electrical activity in the brain. Seizures can cause a change in attention, behavior, or the ability to remain awake and alert (altered mental status). Seizures often involve uncontrollable shaking (convulsions).  Most people with epilepsy lead normal lives. However, people with epilepsy are at an increased risk of falls, accidents, and injuries. Therefore, it is important to begin treatment right away. CAUSES  Epilepsy has many possible causes. Anything that disturbs the normal pattern of brain cell activity can lead to seizures. This may include:   Head injury.  Birth trauma.  High fever as a child.  Stroke.  Bleeding into or around the brain.  Certain drugs.  Prolonged low oxygen, such as what occurs after CPR efforts.  Abnormal brain development.  Certain illnesses, such as meningitis, encephalitis (brain infection), malaria, and other infections.  An imbalance of nerve signaling chemicals (neurotransmitters).  SIGNS AND SYMPTOMS  The symptoms of a seizure can vary greatly from one person to another. Right before a seizure, you may have a warning (aura) that a seizure is about to occur. An aura may include the following  symptoms:  Fear or anxiety.  Nausea.  Feeling like the room is spinning (vertigo).  Vision changes, such as seeing flashing lights or spots. Common symptoms during a seizure include:  Abnormal sensations, such as an abnormal smell or a bitter taste in the mouth.   Sudden, general body  stiffness.   Convulsions that involve rhythmic jerking of the face, arm, or leg on one or both sides.   Sudden change in consciousness.   Appearing to be awake but not responding.   Appearing to be asleep but cannot be awakened.   Grimacing, chewing, lip smacking, drooling, tongue biting, or loss of bowel or bladder control. After a seizure, you may feel sleepy for a while. DIAGNOSIS  Your health care provider will ask about your symptoms and take a medical history. Descriptions from any witnesses to your seizures will be very helpful in the diagnosis. A physical exam, including a detailed neurological exam, is necessary. Various tests may be done, such as:   An electroencephalogram (EEG). This is a painless test of your brain waves. In this test, a diagram is created of your brain waves. These diagrams can be interpreted by a specialist.  An MRI of the brain.   A CT scan of the brain.   A spinal tap (lumbar puncture, LP).  Blood tests to check for signs of infection or abnormal blood chemistry. TREATMENT  There is no cure for epilepsy, but it is generally treatable. Once epilepsy is diagnosed, it is important to begin treatment as soon as possible. For most people with epilepsy, seizures can be controlled with medicines. The following may also be used:  A pacemaker for the brain (vagus nerve stimulator) can be used for people with seizures that are not well controlled by medicine.  Surgery on the brain. For some people, epilepsy eventually goes away. HOME CARE INSTRUCTIONS   Follow your health care provider's recommendations on driving and safety in normal activities.  Get enough rest. Lack of sleep can cause seizures.  Only take over-the-counter or prescription medicines as directed by your health care provider. Take any prescribed medicine exactly as directed.  Avoid any known triggers of your seizures.  Keep a seizure diary. Record what you recall about any  seizure, especially any possible trigger.   Make sure the people you live and work with know that you are prone to seizures. They should receive instructions on how to help you. In general, a witness to a seizure should:   Cushion your head and body.   Turn you on your side.   Avoid unnecessarily restraining you.   Not place anything inside your mouth.   Call for emergency medical help if there is any question about what has occurred.   Follow up with your health care provider as directed. You may need regular blood tests to monitor the levels of your medicine.  SEEK MEDICAL CARE IF:   You develop signs of infection or other illness. This might increase the risk of a seizure.   You seem to be having more frequent seizures.   Your seizure pattern is changing.  SEEK IMMEDIATE MEDICAL CARE IF:   You have a seizure that does not stop after a few moments.   You have a seizure that causes any difficulty in breathing.   You have a seizure that results in a very severe headache.   You have a seizure that leaves you with the inability to speak or use a part of your body.  Document Released: 09/03/2005 Document Revised: 06/24/2013 Document Reviewed: 04/15/2013 [facility] Patient Information 2015 [place], [place]. This information is not intended to replace advice given to you by your health care provider. Make sure you discuss any questions you have with your health care provider.   Emergency Department Resource Guide 1) Find a Doctor and Pay Out of Pocket Although you won't have to find out who is covered by your insurance plan, it is a good idea to ask around and get recommendations. You will then need to call the office and see if the doctor you have chosen will accept you as a new patient and what types of options they offer for patients who are self-pay. Some doctors offer discounts or will set up payment plans for their patients who do not have insurance, but you will  need to ask so you aren't surprised when you get to your appointment.  2) Contact Your Local Health Department Not all health departments have doctors that can see patients for sick visits, but many do, so it is worth a call to see if yours does. If you don't know where your local health department is, you can check in your phone book. The CDC also has a tool to help you locate your state's health department, and many state websites also have listings of all of their local health departments.  3) Find a Walk-in Clinic If your illness is not likely to be very severe or complicated, you may want to try a walk in clinic. These are popping up all over the country in pharmacies, drugstores, and shopping centers. They're usually staffed by nurse practitioners or physician assistants that have been trained to treat common illnesses and complaints. They're usually fairly quick and inexpensive. However, if you have serious medical issues or chronic medical problems, these are probably not your best option.  No Primary Care Doctor: - Call Health Connect at  [phone] - they can help you locate a primary care doctor that  accepts your insurance, provides certain services, etc. - Physician Referral Service- [phone]  Chronic Pain Problems: Organization         Address  Phone   Notes  [facility]  [phone] Patients need to be referred by their primary care doctor.   Medication Assistance: Organization         Address  Phone   Notes  [facility] Medication [facility] [address] [place] [place], [place] [address] [phone] --Must be a resident of [facility] -- Must have NO insurance coverage whatsoever (no Medicaid/ Medicare, etc.) -- The pt. MUST have a primary care doctor that directs their care regularly and follows them in the community   [facility]ist  [phone]   [employer]  [phone]    Agencies that provide inexpensive medical  care: Organization         Address  Phone   Notes  [facility]  [phone]   [facility]    [phone]   [facility] [address] [place], [place] [address] [phone]   [facility] [address], [place] [phone]   Planned Parenthood    [phone]   [facility]    [phone]   Community [facility] and [facility]  [address], [place] Phone:  [phone], Fax:  [phone] Hours of Operation:  9 am - 6 pm, M-F.  Also accepts Medicaid/Medicare and self-pay.  [facility]  [address], [place] Phone: [phone], Fax: [phone]. Hours of Operation:  8:30 am - 5:30 pm, M-F.  Also accepts Medicaid and self-pay.  [facility] [place] [address] [place] Phone: [phone]   [facility] [address] Natasha Bence [place], [place] [phone], Ext. [phone] Mondays & Thursdays: 7-9 AM.  First 15 patients are seen on a first come, first serve basis.    Medicaid-accepting [facility] Providers:  Organization         Address  Phone   Notes  [facility] [address], [place] [phone] Also accepts self-pay patients.  [facility] [address] Laurell Josephs [place], [place]  [phone]   [facility] [address], [place] [phone]   [facility] [address] [place] [phone]   Renaye Rakers [address], [place]   [phone] Only accepts [place] Access [place] patients after they have their name applied to their card.   Self-Pay (no insurance) in [facility]:  Organization         Address  Phone   Notes  Sickle Cell Patients, [facility] [address] [place], [place] [phone]   [facility] [address] [place],  [place] [phone]   [facility] [place]  [address], [place] [phone]   [facility]/Dr. Osei-Bonsu  [address], [place] or [address], [place] [phone] Phone number for both [place] and [place] locations is the same.  Urgent Medical and [facility] [address] [place] [phone]   [facility] [address] [place] or [address] [phone]   [facility] [address], [place] [phone], phone; [phone], fax Sees patients 1st and 3rd Saturday of every month.  Must not qualify for public or private insurance (i.e. Medicaid, Medicare, [place] Health Choice, Veterans' Benefits)  Household income should be no more than 200% of the poverty level The clinic cannot treat you if you are pregnant or think you are pregnant  Sexually transmitted diseases are not treated at the clinic.    Dental Care: Organization         Address  Phone  Notes  [facility] Department of [facility] [address] [place], [place] [phone] Accepts children up to age 100 who are enrolled in [place] or [place] Health Choice; pregnant women with a Medicaid card; and children who have applied for Medicaid or [place] Health Choice, but were declined, whose parents can pay a reduced fee at time of service.  [facility] Department of [facility]  [address], [place] [phone] Accepts children up to age 18 who are enrolled in [place] or [place] Health Choice; pregnant women with a Medicaid card; and children who have applied for Medicaid or  Health Choice, but were declined, whose parents can pay a reduced fee at time of service.  [facility]  [address] [place], [place] [phone] Patients are seen by appointment only. Walk-ins are not accepted. [facility] will see patients 28 years of age and older. Monday - Tuesday (  8am-5pm) Most Wednesdays (8:30-5pm) $30 per visit, cash only  [facility] Adult Dental Access [facility]  [address], [facility] [phone] Patients are seen by appointment only. Walk-ins are not accepted. [facility] will see patients 59 years of age and older. One Wednesday Evening (Monthly: Volunteer Based).  $30 per visit, cash only  [employer]  [phone] for adults; Children under age 87, call Graduate Pediatric Dentistry at [phone]. Children aged 51-14, please call [phone] to request a pediatric application.  Dental services are provided in all areas of dental care including fillings, crowns and bridges, complete and partial dentures, implants, gum treatment, root canals, and extractions. Preventive care is also provided. Treatment is provided to both adults and children. Patients are selected via a lottery and there is often a waiting list.   [facility] [address] [place]  [phone] [email or web address]   [facility] [address] [place], [place] [phone], Ext. [phone] Second and Fourth Thursday of each month, opens at 6:30 AM; Clinic ends at 9 AM.  Patients are seen on a first-come first-served basis, and a limited number are seen during each clinic.   [facility]  [address] Ether Griffins [place], [place] [phone]   Eligibility Requirements You must have lived in [place], [place], or [place] counties for at least the last three months.   You cannot be eligible for state or federal sponsored [place], including [employer], [place], or [employer].   You generally cannot be eligible for healthcare insurance through your employer.    How to apply: Eligibility screenings are held every Tuesday and Wednesday afternoon from 1:00 pm until 4:00 pm. You do not need an appointment for the interview!   [facility] [address] [place], [place] [phone]   [facility]  [phone]   [facility]  [phone]   [facility] Health Department  [phone]    Behavioral Health Resources in the Community: Intensive Outpatient Programs Organization         Address  Phone  Notes  [facility] [address], [place], [place] [phone]   [facility] Outpatient [address] [place], [place] [phone]   [facility] [address], [place], [place]  [phone]   [facility] Mental Health [address]  [place], [place] [phone] or [phone]   Substance Abuse Resources Organization         Address  Phone  Notes  [facility]  [phone]   [facility]  [phone]   [facility]  [phone]   Floydene Flock  [phone]   Residential & Outpatient Substance Abuse Program  [phone]   Psychological Services Organization         Address  Phone  Notes  [facility]  [phone]   [facility] Services  [phone]   [facility] [address], [place] [phone] or [phone]    Mobile Crisis Teams Organization         Address  Phone  Notes  [facility], Mobile Crisis Care Unit  [phone]   [facility]  [address] [place], [place] [phone]   Doristine Locks [address] [place] [phone]    Self-Help/Support Groups Organization         Address  Phone  Notes  [facility] [place] - variety of support groups  336- I7437963 Call for more information  Narcotics Anonymous (NA), [facility] [address], [employer] [place]  2 meetings at this location   Statistician         Address  Phone  Notes  [facility] [address],    [place]  [phone]   [facility]  [address], [place] [address], [place], [place] [phone]   [facility] Treatment Facility [address] [place], [place] [phone] Admissions: 8am-3pm M-F  [facility] [address],    [place], [place] [phone]   [facility] [address] [place], [place], [place] [phone]   [facility] [address],  [place], [place] [phone]   [facility] - Intensive Outpatient [address] Laurell Josephs [address], [place], [place] [phone]   [facility] ([facility] [address] [place]  [place], [place] [phone] or [phone]   [facility] ([facility]) [address] [place], [place] [phone] Accepts Medicaid  [facility] [address]  [place] [phone] Substance Abuse/Addiction Treatment   [facility] Organization         Address  Phone  Notes  [facility]  [phone]   Angie Fava, PhD [address] Ervin Knack [place], [place]   [phone] or [phone]   [facility] Behavioral   [address] [place], [place] [phone]   [facility] [address] [place], [place] [phone] Insurance/Medicaid/sponsorship through [facility] and Families [address]                                    [place], [place] [phone] Therapy/tele-psych/case  [facility] [address] [place], [place] [phone]    Dr. Lolly Mustache  [phone]   [facility]  [employer] [facility] Dept. 1) [address], [place] 2) [address] [place] 3)  [address], [place] [phone]  [phone]   [facility] Child Abuse Hotline [phone] or [phone] (After Hours)

## 2015-05-12 ENCOUNTER — Encounter (HOSPITAL_COMMUNITY): Payer: Self-pay

## 2015-05-12 ENCOUNTER — Emergency Department (HOSPITAL_COMMUNITY): Payer: BLUE CROSS/BLUE SHIELD

## 2015-05-12 ENCOUNTER — Emergency Department (HOSPITAL_COMMUNITY)
Admission: EM | Admit: 2015-05-12 | Discharge: 2015-05-12 | Disposition: A | Discharge status: Home / Self Care | Payer: BLUE CROSS/BLUE SHIELD | Attending: Emergency Medicine | Admitting: Emergency Medicine

## 2015-05-12 ENCOUNTER — Emergency Department (HOSPITAL_COMMUNITY)
Admission: EM | Admit: 2015-05-12 | Discharge: 2015-05-12 | Disposition: A | Discharge status: Home / Self Care | Payer: BLUE CROSS/BLUE SHIELD | Source: Home / Self Care | Attending: Emergency Medicine | Admitting: Emergency Medicine

## 2015-05-12 DIAGNOSIS — Z87891 Personal history of nicotine dependence: Secondary | ICD-10-CM

## 2015-05-12 DIAGNOSIS — R079 Chest pain, unspecified: Secondary | ICD-10-CM | POA: Insufficient documentation

## 2015-05-12 DIAGNOSIS — Y92008 Other place in unspecified non-institutional (private) residence as the place of occurrence of the external cause: Secondary | ICD-10-CM

## 2015-05-12 DIAGNOSIS — Z043 Encounter for examination and observation following other accident: Secondary | ICD-10-CM

## 2015-05-12 DIAGNOSIS — Y9389 Activity, other specified: Secondary | ICD-10-CM

## 2015-05-12 DIAGNOSIS — G40909 Epilepsy, unspecified, not intractable, without status epilepticus: Secondary | ICD-10-CM | POA: Insufficient documentation

## 2015-05-12 DIAGNOSIS — R55 Syncope and collapse: Secondary | ICD-10-CM | POA: Insufficient documentation

## 2015-05-12 DIAGNOSIS — R569 Unspecified convulsions: Secondary | ICD-10-CM | POA: Diagnosis present

## 2015-05-12 DIAGNOSIS — Y998 Other external cause status: Secondary | ICD-10-CM

## 2015-05-12 LAB — URINALYSIS, ROUTINE W REFLEX MICROSCOPIC
Bilirubin Urine: NEGATIVE
Glucose, UA: NEGATIVE mg/dL
Hgb urine dipstick: NEGATIVE
Ketones, ur: NEGATIVE mg/dL
Leukocytes, UA: NEGATIVE
Nitrite: NEGATIVE
Protein, ur: NEGATIVE mg/dL
Specific Gravity, Urine: 1.022 (ref 1.005–1.030)
Urobilinogen, UA: 2 mg/dL — ABNORMAL HIGH (ref 0.0–1.0)
pH: 6 (ref 5.0–8.0)

## 2015-05-12 LAB — RAPID URINE DRUG SCREEN, HOSP PERFORMED
Amphetamines: NOT DETECTED
Barbiturates: NOT DETECTED
Benzodiazepines: NOT DETECTED
Cocaine: NOT DETECTED
Opiates: NOT DETECTED
Tetrahydrocannabinol: POSITIVE — AB

## 2015-05-12 LAB — CBC WITH DIFFERENTIAL/PLATELET
Basophils Absolute: 0 10*3/uL (ref 0.0–0.1)
Basophils Relative: 1 % (ref 0–1)
Eosinophils Absolute: 0.3 10*3/uL (ref 0.0–0.7)
Eosinophils Relative: 5 % (ref 0–5)
HCT: 39.2 % (ref 39.0–52.0)
Hemoglobin: 13.6 g/dL (ref 13.0–17.0)
Lymphocytes Relative: 22 % (ref 12–46)
Lymphs Abs: 1.3 10*3/uL (ref 0.7–4.0)
MCH: 29 pg (ref 26.0–34.0)
MCHC: 34.7 g/dL (ref 30.0–36.0)
MCV: 83.6 fL (ref 78.0–100.0)
Monocytes Absolute: 0.4 10*3/uL (ref 0.1–1.0)
Monocytes Relative: 7 % (ref 3–12)
Neutro Abs: 3.8 10*3/uL (ref 1.7–7.7)
Neutrophils Relative %: 65 % (ref 43–77)
Platelets: 227 10*3/uL (ref 150–400)
RBC: 4.69 MIL/uL (ref 4.22–5.81)
RDW: 11.8 % (ref 11.5–15.5)
WBC: 5.8 10*3/uL (ref 4.0–10.5)

## 2015-05-12 LAB — COMPREHENSIVE METABOLIC PANEL
ALT: 13 U/L — ABNORMAL LOW (ref 17–63)
AST: 17 U/L (ref 15–41)
Albumin: 4.1 g/dL (ref 3.5–5.0)
Alkaline Phosphatase: 51 U/L (ref 38–126)
Anion gap: 8 (ref 5–15)
BUN: 8 mg/dL (ref 6–20)
CO2: 27 mmol/L (ref 22–32)
Calcium: 8.9 mg/dL (ref 8.9–10.3)
Chloride: 102 mmol/L (ref 101–111)
Creatinine, Ser: 1.18 mg/dL (ref 0.61–1.24)
GFR calc Af Amer: >60 mL/min (ref >60)
GFR calc non Af Amer: >60 mL/min (ref >60)
Glucose, Bld: 98 mg/dL (ref 65–99)
Potassium: 3.5 mmol/L (ref 3.5–5.1)
Sodium: 137 mmol/L (ref 135–145)
Total Bilirubin: 0.3 mg/dL (ref 0.3–1.2)
Total Protein: 7 g/dL (ref 6.5–8.1)

## 2015-05-12 LAB — I-STAT TROPONIN, ED: Troponin i, poc: 0.01 ng/mL (ref 0.00–0.08)

## 2015-05-12 MED ORDER — SODIUM CHLORIDE 0.9 % IV SOLN
1000.0000 mg | INTRAVENOUS | Status: AC
  Administered 2015-05-12: 1000 mg via INTRAVENOUS
  Filled 2015-05-12: qty 10

## 2015-05-12 MED ORDER — SODIUM CHLORIDE 0.9 % IV SOLN
500.0000 mg | Freq: Two times a day (BID) | INTRAVENOUS | Status: DC
  Administered 2015-05-12: 500 mg via INTRAVENOUS
  Filled 2015-05-12 (×2): qty 5

## 2015-05-12 MED ORDER — SODIUM CHLORIDE 0.9 % IV BOLUS (SEPSIS)
1000.0000 mL | Freq: Once | INTRAVENOUS | Status: AC
  Administered 2015-05-12: 1000 mL via INTRAVENOUS

## 2015-05-12 NOTE — ED Provider Notes (Signed)
CSN: [id number]     Arrival date & time 05/12/15  0865 History   First MD Initiated Contact with Patient 05/12/15 0831     Chief Complaint  Patient presents with  . Seizures     (Consider location/radiation/quality/duration/timing/severity/associated sxs/prior Treatment) HPI Comments: Pt comes in with cc of seizures. Pt has hx of seizure disorders, he was switched to keppra yday after he came in with a seizure. Pt reports not having filled his prescription yet, and per EMs reports to RN had a seizure episode witnessed by fiance. Pt is aox3. He reports no new changes since yday. No trauma. No drug use.  Also c/o headaches. Chronic h/a, off and on for few days, no neuro deficits with them. Pt does c/o seeing spots every now and then from his eye.   Patient is a 34 y.o. male presenting with seizures. The history is provided by the patient.  Seizures   Past Medical History  Diagnosis Date  . Seizures    Past Surgical History  Procedure Laterality Date  . Vasectomy Bilateral    No family history on file. Social History  Substance Use Topics  . Smoking status: Former Smoker    Quit date: 07/18/2014  . Smokeless tobacco: Not on file  . Alcohol Use: Yes     Comment: socially    Review of Systems  Constitutional: Negative for activity change and appetite change.  Respiratory: Negative for cough and shortness of breath.   Cardiovascular: Negative for chest pain.  Gastrointestinal: Negative for abdominal pain.  Genitourinary: Negative for dysuria.  Neurological: Positive for seizures.      Allergies  Review of patient's allergies indicates no known allergies.  Home Medications   Prior to Admission medications   Medication Sig Start Date End Date Taking? Authorizing Provider  acetaminophen (TYLENOL) 325 MG tablet Take 650 mg by mouth every 6 (six) hours as needed for headache.   Yes Historical Provider, MD  levETIRAcetam (KEPPRA) 500 MG tablet Take 1 tablet (500 mg  total) by mouth 2 (two) times daily. Patient not taking: Reported on 05/12/2015 05/11/15   Trixie Dredge, PA-C  penicillin v potassium (VEETID) 500 MG tablet Take 1 tablet (500 mg total) by mouth 4 (four) times daily. Patient not taking: Reported on 05/12/2015 05/11/15   Trixie Dredge, PA-C  phenytoin (DILANTIN) 100 MG ER capsule Take 1 capsule (100 mg total) by mouth 3 (three) times daily. Patient not taking: Reported on 05/11/2015 10/14/14   Gerhard Munch, MD   BP 131/84 mmHg  Pulse 68  Temp(Src) 98 F (36.7 C) (Oral)  Resp 16  SpO2 99% Physical Exam  Constitutional: He is oriented to person, place, and time. He appears well-developed.  HENT:  Head: Normocephalic and atraumatic.  Eyes: Conjunctivae and EOM are normal. Pupils are equal, round, and reactive to light.  Neck: Normal range of motion. Neck supple.  Cardiovascular: Normal rate and regular rhythm.   Pulmonary/Chest: Effort normal and breath sounds normal.  Abdominal: Soft. Bowel sounds are normal. He exhibits no distension. There is no tenderness. There is no rebound and no guarding.  Neurological: He is alert and oriented to person, place, and time. No cranial nerve deficit. Coordination normal.  Skin: Skin is warm.  Nursing note and vitals reviewed.   ED Course  Procedures (including critical care time) Labs Review Labs Reviewed - No data to display  Imaging Review No results found. I have personally reviewed and evaluated these images and lab results as part of  my medical decision-making.   EKG Interpretation None      MDM   Final diagnoses:  Seizure disorder  Seizure   Pt comes in with cc of seizure and also mentions h/a and some visual spots.  Headache doesn't appear to be coming from an emergent pathology. We will give him a keppra load. He has the prescription - and we urged him to fill up the scripts immediately. Neuro f/u given.  Derwood Kaplan, MD 05/12/15 1102

## 2015-05-12 NOTE — Discharge Instructions (Signed)
Please fill your Keppra prescription and start taking immediately. Please contact neurology schedule immediate follow-up evaluation. Please monitor for new or worsening signs or symptoms, return immediately if any present.

## 2015-05-12 NOTE — ED Provider Notes (Signed)
CSN: [id number]     Arrival date & time 05/12/15  1625 History   First MD Initiated Contact with Patient 05/12/15 1626     Chief Complaint  Patient presents with  . Chest Pain  . Seizures   HPI   History 34 year old male presents for the third time in 2 days with complaints of syncope. Patient reports that 2 days ago he was seen in the emergency room for dental pain, informed ED provider that he had an aura similar to previous seizures, but did not have a seizure. He reported that he was not taking his Dilantin due it causing drowsiness. Dr. Roseanne Reno was consulted who recommended DC Dilantin, starting on Keppra.   Patient returned yesterday with reported seizure-like activity witnessed by his fiance. He was given 1000 mg of Keppra and no acute findings that would necessitate further evaluation or management ED, and discharged home with follow-up with neurology.  Patient reports today he was unable to make it to the pharmacy to get his medication, notes that after getting home from the hospital he has been arguing this fianc. Patient reports that he was walking down the hall when he had left anterior chest pain and squeezing, headache; symptoms typical of his prodrome to seizure.  Patient reports that he fell to the ground hit his neck on a chair. He reports waking up on the floor, denies any postictal phase, retrograde amnesia, loss of bowel or bladder incontinence. There were no witnesses to this event. At the time of evaluation patient reports chest pain symptoms are improving, he described it is "squeezing".  Patient denies any fever, significant trauma from the fall, drug or alcohol use, or any signs of infection.   Past Medical History  Diagnosis Date  . Seizures    Past Surgical History  Procedure Laterality Date  . Vasectomy Bilateral   . Hand surgery     No family history on file. Social History  Substance Use Topics  . Smoking status: Former Smoker    Quit date: 07/18/2014  .  Smokeless tobacco: None  . Alcohol Use: Yes     Comment: socially    Review of Systems  All other systems reviewed and are negative.   Allergies  Review of patient's allergies indicates no known allergies.  Home Medications   Prior to Admission medications   Medication Sig Start Date End Date Taking? Authorizing Provider  acetaminophen (TYLENOL) 500 MG tablet Take 500 mg by mouth every 6 (six) hours as needed for mild pain.   Yes Historical Provider, MD  levETIRAcetam (KEPPRA) 500 MG tablet Take 1 tablet (500 mg total) by mouth 2 (two) times daily. Patient not taking: Reported on 05/12/2015 05/11/15   Trixie Dredge, PA-C  penicillin v potassium (VEETID) 500 MG tablet Take 1 tablet (500 mg total) by mouth 4 (four) times daily. Patient not taking: Reported on 05/12/2015 05/11/15   Trixie Dredge, PA-C  phenytoin (DILANTIN) 100 MG ER capsule Take 1 capsule (100 mg total) by mouth 3 (three) times daily. Patient not taking: Reported on 05/11/2015 10/14/14   Gerhard Munch, MD   BP 125/71 mmHg  Pulse 65  Temp(Src) 98.2 F (36.8 C) (Oral)  Resp 15  SpO2 100%   Physical Exam  Constitutional: He is oriented to person, place, and time. He appears well-developed and well-nourished.  HENT:  Head: Normocephalic and atraumatic.  Eyes: Conjunctivae are normal. Pupils are equal, round, and reactive to light. Right eye exhibits no discharge. Left eye exhibits no  discharge. No scleral icterus.  Neck: Normal range of motion. No JVD present. No tracheal deviation present.  Cardiovascular: Normal rate, regular rhythm, normal heart sounds and intact distal pulses.  Exam reveals no gallop and no friction rub.   No murmur heard. Pulmonary/Chest: Effort normal and breath sounds normal. No stridor. No respiratory distress. He has no wheezes. He has no rales. He exhibits no tenderness.  Abdominal: Soft. Bowel sounds are normal. He exhibits no distension and no mass. There is no tenderness. There is no rebound and  no guarding.  Musculoskeletal: Normal range of motion. He exhibits no edema or tenderness.  Neurological: He is alert and oriented to person, place, and time. He has normal strength. No cranial nerve deficit or sensory deficit. Coordination normal. GCS eye subscore is 4. GCS verbal subscore is 5. GCS motor subscore is 6.  Skin: Skin is warm and dry. No rash noted. No erythema. No pallor.  Psychiatric: He has a normal mood and affect. His behavior is normal. Judgment and thought content normal.  Nursing note and vitals reviewed.   ED Course  Procedures (including critical care time) Labs Review Labs Reviewed  COMPREHENSIVE METABOLIC PANEL - Abnormal; Notable for the following:    ALT 13 (*)    All other components within normal limits  URINALYSIS, ROUTINE W REFLEX MICROSCOPIC (NOT AT [facility]) - Abnormal; Notable for the following:    Urobilinogen, UA 2.0 (*)    All other components within normal limits  URINE RAPID DRUG SCREEN, HOSP PERFORMED - Abnormal; Notable for the following:    Tetrahydrocannabinol POSITIVE (*)    All other components within normal limits  CBC WITH DIFFERENTIAL/PLATELET  Rosezena Sensor, ED    Imaging Review Dg Chest 2 View  05/12/2015   CLINICAL DATA:  Chest pain and seizure for 2 days  EXAM: CHEST  2 VIEW  COMPARISON:  None.  FINDINGS: The heart size and mediastinal contours are within normal limits. Both lungs are clear. The visualized skeletal structures are unremarkable.  IMPRESSION: No active cardiopulmonary disease.   Electronically Signed   By: Sherian Rein M.D.   On: 05/12/2015 17:36   I have personally reviewed and evaluated these images and lab results as part of my medical decision-making.   EKG Interpretation   Date/Time:  Thursday May 12 2015 16:29:09 EDT Ventricular Rate:  67 PR Interval:  145 QRS Duration: 97 QT Interval:  379 QTC Calculation: 400 R Axis:   88 Text Interpretation:  Sinus rhythm RSR' in V1 or V2, probably normal   variant ST elevation suggests acute pericarditis No significant change  since last tracing Confirmed by ALLEN  MD, ANTHONY ([id number]) on 05/12/2015  6:49:28 PM      MDM   Final diagnoses:  Syncope, unspecified syncope type   Labs: CBC, CMP, urinalysis-   Imaging:   Consults:  Therapeutics: Keppra  Discharge Meds: Keppra  Assessment/Plan: Patient's presentation likely represents syncope as opposed to seizure today. Patient was given loading dose of Keppra yesterday, he was given a IV dose of it today. Patient had no postictal, retrograde amnesia, this was not witnessed. Patient remembers the fall and hitting the ground, followed by the. Waking up. Vital signs are reassuring, laboratory reassuring, no acute findings on my physical exam today. Uncertain etiology of the syncope. Patient will be discharged home with should return precautions, encouraged to fill his prescription immediately, follow up with neurology for further evaluation and management. Patient requests work note. He was given a work  note for 1 day, he is encouraged to talk to his primary care provider before returning to work.         Eyvonne Mechanic, PA-C 05/13/15 0032  Lorre Nick, MD 05/16/15 0800

## 2015-05-12 NOTE — ED Notes (Signed)
Pt presents via [facility] for c/o CP and seizure.  Pt began having CP approximately 245pm, then started having a HA which he normally gets before a seizure, therefore called 911.  Pt reports after calling 911 he was on the phone walking and the next thing he remember he woke up in the floor, thinks he had a seizure.  Pt was seen at [facility] this morning and had his medication changed.  Pt reports thoracic tenderness, c-collar on.  BP-126/80 P-70 reg, R-16 O2-98% RA, CBG-80.  EKG unremarkable.  Pt received 4 Zofran and 324 ASA, 1 NTG without change in pain.  Pt a x 4, NAD.

## 2015-05-12 NOTE — Discharge Instructions (Signed)
Driving and Equipment Restrictions °Some medical problems make it dangerous to drive, ride a bike, or use machines. Some of these problems are: °· A hard blow to the head (concussion). °· Passing out (fainting). °· Twitching and shaking (seizures). °· Low blood sugar. °· Taking medicine to help you relax (sedatives). °· Taking pain medicines. °· Wearing an eye patch. °· Wearing splints. This can make it hard to use parts of your body that you need to drive safely. °HOME CARE  °· Do not drive until your doctor says it is okay. °· Do not use machines until your doctor says it is okay. °You may need a form signed by your doctor (medical release) before you can drive again. You may also need this form before you do other tasks where you need to be fully alert. °MAKE SURE YOU: °· Understand these instructions. °· Will watch your condition. °· Will get help right away if you are not doing well or get worse. °Document Released: 10/11/2004 Document Revised: 11/26/2011 Document Reviewed: 01/11/2010 °ExitCare® Patient Information ©2015 [employer]. This information is not intended to replace advice given to you by your health care provider. Make sure you discuss any questions you have with your health care provider. ° °

## 2015-05-12 NOTE — ED Notes (Addendum)
Pts fiance states, "I don't know what it is but he cannot control his temper. He got Keppra yesterday & has been having a temper all day. He does not like taking Dilantin because it makes him sleepy but I don't know if this is making him act like this." pt agrees to take Keppra

## 2015-05-12 NOTE — ED Notes (Addendum)
Pt was seen here for seizure yesterday but failed to fill his RX for keppra.  Per EMS report: He is on dilantin normally which makes him drowsy so he spaces it out and takes it 3 x's/wk on average instead of Qday.  He was given Keppra here yesterday.  He had 2 seizures this am.  VS by EMS: CBG 63. GFD gave oral glucose and came up to 88.   110/84, 78, 18.  IV: 20g RH.  Pt's girlfriend is supposed to be getting Keppra RX filled this am.

## 2015-05-23 ENCOUNTER — Encounter (HOSPITAL_COMMUNITY): Payer: Self-pay | Admitting: Emergency Medicine

## 2015-05-23 ENCOUNTER — Emergency Department (HOSPITAL_COMMUNITY)
Admission: EM | Admit: 2015-05-23 | Discharge: 2015-05-23 | Disposition: A | Discharge status: Home / Self Care | Payer: BLUE CROSS/BLUE SHIELD | Attending: Emergency Medicine | Admitting: Emergency Medicine

## 2015-05-23 DIAGNOSIS — G40909 Epilepsy, unspecified, not intractable, without status epilepticus: Secondary | ICD-10-CM | POA: Diagnosis not present

## 2015-05-23 DIAGNOSIS — R5383 Other fatigue: Secondary | ICD-10-CM | POA: Insufficient documentation

## 2015-05-23 DIAGNOSIS — R51 Headache: Secondary | ICD-10-CM | POA: Insufficient documentation

## 2015-05-23 DIAGNOSIS — Z87891 Personal history of nicotine dependence: Secondary | ICD-10-CM | POA: Diagnosis not present

## 2015-05-23 DIAGNOSIS — Z9119 Patient's noncompliance with other medical treatment and regimen: Secondary | ICD-10-CM | POA: Insufficient documentation

## 2015-05-23 DIAGNOSIS — R569 Unspecified convulsions: Secondary | ICD-10-CM

## 2015-05-23 DIAGNOSIS — Z9114 Patient's other noncompliance with medication regimen: Secondary | ICD-10-CM

## 2015-05-23 LAB — I-STAT CHEM 8, ED
BUN: 16 mg/dL (ref 6–20)
CREATININE: 1.1 mg/dL (ref 0.61–1.24)
Calcium, Ion: 1.23 mmol/L (ref 1.12–1.23)
Chloride: 105 mmol/L (ref 101–111)
GLUCOSE: 99 mg/dL (ref 65–99)
HCT: 41 % (ref 39.0–52.0)
HEMOGLOBIN: 13.9 g/dL (ref 13.0–17.0)
Potassium: 4.6 mmol/L (ref 3.5–5.1)
Sodium: 139 mmol/L (ref 135–145)
TCO2: 22 mmol/L (ref 0–100)

## 2015-05-23 LAB — CBG MONITORING, ED: GLUCOSE-CAPILLARY: 96 mg/dL (ref 65–99)

## 2015-05-23 MED ORDER — ACETAMINOPHEN 325 MG PO TABS
650.0000 mg | ORAL_TABLET | Freq: Once | ORAL | Status: AC
  Administered 2015-05-23: 650 mg via ORAL
  Filled 2015-05-23: qty 2

## 2015-05-23 MED ORDER — LEVETIRACETAM 500 MG/5ML IV SOLN
1000.0000 mg | Freq: Once | INTRAVENOUS | Status: AC
  Administered 2015-05-23: 1000 mg via INTRAVENOUS
  Filled 2015-05-23: qty 10

## 2015-05-23 NOTE — ED Provider Notes (Signed)
CSN: [id number]     Arrival date & time 05/23/15  0712 History   First MD Initiated Contact with Patient 05/23/15 [phone]     Chief Complaint  Patient presents with  . Seizures     (Consider location/radiation/quality/duration/timing/severity/associated sxs/prior Treatment) HPI Comments: 34 year old male with history of seizures, past smoker, no current neurologist, history of noncompliance of medications mild alcohol use presents after witnessed seizure activity upon awakening this morning. Patient has had more frequent seizures recently however he is also not taken his medicines regularly and missed 2 days of medicines. Patient just forgot to take them. Patient denies any other new medications, fevers or alcohol abuse. No new stressors or change in sleeping habits. Significant other witnessed generalized shaking that improved and patient feels tired since. Unknown exactly how long seizure was.  Patient is a 34 y.o. male presenting with seizures. The history is provided by the patient.  Seizures   Past Medical History  Diagnosis Date  . Seizures    Past Surgical History  Procedure Laterality Date  . Vasectomy Bilateral   . Hand surgery     History reviewed. No pertinent family history. Social History  Substance Use Topics  . Smoking status: Former Smoker    Quit date: 07/18/2014  . Smokeless tobacco: None  . Alcohol Use: Yes     Comment: socially    Review of Systems  Constitutional: Positive for fatigue. Negative for fever and chills.  HENT: Negative for congestion.   Eyes: Negative for visual disturbance.  Respiratory: Negative for shortness of breath.   Cardiovascular: Negative for chest pain.  Gastrointestinal: Negative for vomiting and abdominal pain.  Genitourinary: Negative for dysuria and flank pain.  Musculoskeletal: Negative for back pain, neck pain and neck stiffness.  Skin: Negative for rash.  Neurological: Positive for seizures and headaches. Negative for  weakness and light-headedness.      Allergies  Review of patient's allergies indicates no known allergies.  Home Medications   Prior to Admission medications   Medication Sig Start Date End Date Taking? Authorizing Provider  acetaminophen (TYLENOL) 500 MG tablet Take 500 mg by mouth every 6 (six) hours as needed for mild pain.    Historical Provider, MD  levETIRAcetam (KEPPRA) 500 MG tablet Take 1 tablet (500 mg total) by mouth 2 (two) times daily. Patient not taking: Reported on 05/12/2015 05/11/15   Trixie Dredge, PA-C  penicillin v potassium (VEETID) 500 MG tablet Take 1 tablet (500 mg total) by mouth 4 (four) times daily. Patient not taking: Reported on 05/12/2015 05/11/15   Trixie Dredge, PA-C  phenytoin (DILANTIN) 100 MG ER capsule Take 1 capsule (100 mg total) by mouth 3 (three) times daily. Patient not taking: Reported on 05/11/2015 10/14/14   Gerhard Munch, MD   BP 116/70 mmHg  Pulse 57  Temp(Src) 97.6 F (36.4 C) (Oral)  Resp 13  SpO2 100% Physical Exam  Constitutional: He is oriented to person, place, and time. He appears well-developed and well-nourished.  HENT:  Head: Normocephalic and atraumatic.  Mild dry mucous membranes  Eyes: Right eye exhibits no discharge. Left eye exhibits no discharge.  Neck: Normal range of motion. Neck supple. No tracheal deviation present.  Cardiovascular: Normal rate and regular rhythm.   Pulmonary/Chest: Effort normal and breath sounds normal.  Abdominal: Soft. He exhibits no distension. There is no tenderness. There is no guarding.  Musculoskeletal: He exhibits no edema.  Neurological: He is alert and oriented to person, place, and time. GCS eye subscore is  4. GCS verbal subscore is 5. GCS motor subscore is 6.  5+ strength in UE and LE with f/e at major joints. Sensation to palpation intact in UE and LE. CNs 2-12 grossly intact.  EOMFI.  PERRL.   Finger nose and coordination intact bilateral.   Visual fields intact to finger testing. No  nystagmus   Skin: Skin is warm. No rash noted.  Psychiatric: He has a normal mood and affect.  Nursing note and vitals reviewed.   ED Course  Procedures (including critical care time) Labs Review Labs Reviewed  CBG MONITORING, ED  CBG MONITORING, ED  I-STAT CHEM 8, ED    Imaging Review No results found. I have personally reviewed and evaluated these images and lab results as part of my medical decision-making.   EKG Interpretation   Date/Time:  Monday May 23 2015 07:14:28 EDT Ventricular Rate:  75 PR Interval:  150 QRS Duration: 100 QT Interval:  388 QTC Calculation: 433 R Axis:   84 Text Interpretation:  Sinus rhythm RSR' in V1 or V2, probably normal  variant ST elev, probable normal early repol pattern Baseline wander in  lead(s) V2 No significant change since last tracing Confirmed by WARD,   DO, KRISTEN ([id number]) on 05/23/2015 7:23:04 AM      MDM   Final diagnoses:  Seizure  Non compliance w medication regimen   Patient presents after witnessed seizure, history of similar. Patient is been noncompliant with medications. Normal neurologic exam the ER mild fatigue appearing. Plan for Keppra load and outpatient follow-up. I stressed outpatient follow up with a specialist and primary doctor. No seizures in ED. Pt at baseline. Results and differential diagnosis were discussed with the patient/parent/guardian. Xrays were independently reviewed by myself.  Close follow up outpatient was discussed, comfortable with the plan.   Medications  levETIRAcetam (KEPPRA) 1,000 mg in sodium chloride 0.9 % 100 mL IVPB (0 mg Intravenous Stopped 05/23/15 0945)  acetaminophen (TYLENOL) tablet 650 mg (650 mg Oral Given 05/23/15 0928)    Filed Vitals:   05/23/15 0815 05/23/15 0845 05/23/15 0900 05/23/15 0915  BP: 126/79 105/76 113/73 116/70  Pulse: 65 62 49 57  Temp:      TempSrc:      Resp: SpO2: 100% 100% 100% 100%    Final diagnoses:  Seizure  Non compliance  w medication regimen       Blane Ohara, MD 05/23/15 1014

## 2015-05-23 NOTE — ED Notes (Addendum)
Per EMS, pt has hx of seizures, and had an unwitnessed seizure this am. Pt's wife found him on the floor and was unable to wake him, pt was having snoring respirations. No incontinence, he did bite his lip. En route pt experienced a drop in BP, was cold and clammy BP 60/40 HR 60. Given a liter of fluid, BP improved. Pt recently changed seizure medication from dilantin to keppra. Per wife, pt may have missed a few doses over the weekend.

## 2015-05-23 NOTE — Discharge Instructions (Signed)
Please take your medications as directed.  If you were given medicines take as directed.  If you are on coumadin or contraceptives realize their levels and effectiveness is altered by many different medicines.  If you have any reaction (rash, tongues swelling, other) to the medicines stop taking and see a physician.    If your blood pressure was elevated in the ER make sure you follow up for management with a primary doctor or return for chest pain, shortness of breath or stroke symptoms.  Please follow up as directed and return to the ER or see a physician for new or worsening symptoms.  Thank you. Filed Vitals:   05/23/15 0725  BP: 136/85  Pulse: 73  Temp: 97.6 F (36.4 C)  TempSrc: Oral  Resp: 18  SpO2: 100%    Epilepsy People with epilepsy have times when they shake and jerk uncontrollably (seizures). This happens when there is a sudden change in brain function. Epilepsy may have many possible causes. Anything that disturbs the normal pattern of brain cell activity can lead to seizures. HOME CARE   Follow your doctor's instructions about driving and safety during normal activities.  Get enough sleep.  Only take medicine as told by your doctor.  Avoid things that you know can cause you to have seizures (triggers).  Write down when your seizures happen and what you remember about each seizure. Write down anything you think may have caused the seizure to happen.  Tell the people you live and work with that you have seizures. Make sure they know how to help you. They should:  Cushion your head and body.  Turn you on your side.  Not restrain you.  Not place anything inside your mouth.  Call for local emergency medical help if there is any question about what has happened.  Keep all follow-up visits with your doctor. This is very important. GET HELP IF:  You get an infection or start to feel sick. You may have more seizures when you are sick.  You are having seizures  more often.  Your seizure pattern is changing. GET HELP RIGHT AWAY IF:   A seizure does not stop after a few seconds or minutes.  A seizure causes you to have trouble breathing.  A seizure gives you a very bad headache.  A seizure makes you unable to speak or use a part of your body. Document Released: 07/01/2009 Document Revised: 06/24/2013 Document Reviewed: 04/15/2013 [facility] Patient Information 2015 [place], [place]. This information is not intended to replace advice given to you by your health care provider. Make sure you discuss any questions you have with your health care provider.

## 2015-05-24 ENCOUNTER — Emergency Department (HOSPITAL_COMMUNITY)
Admission: EM | Admit: 2015-05-24 | Discharge: 2015-05-24 | Disposition: A | Discharge status: Home / Self Care | Payer: BLUE CROSS/BLUE SHIELD | Attending: Emergency Medicine | Admitting: Emergency Medicine

## 2015-05-24 ENCOUNTER — Encounter (HOSPITAL_COMMUNITY): Payer: Self-pay | Admitting: *Deleted

## 2015-05-24 ENCOUNTER — Emergency Department (HOSPITAL_COMMUNITY): Payer: BLUE CROSS/BLUE SHIELD

## 2015-05-24 DIAGNOSIS — R519 Headache, unspecified: Secondary | ICD-10-CM

## 2015-05-24 DIAGNOSIS — G40909 Epilepsy, unspecified, not intractable, without status epilepticus: Secondary | ICD-10-CM | POA: Diagnosis not present

## 2015-05-24 DIAGNOSIS — E876 Hypokalemia: Secondary | ICD-10-CM | POA: Diagnosis not present

## 2015-05-24 DIAGNOSIS — Z87891 Personal history of nicotine dependence: Secondary | ICD-10-CM | POA: Diagnosis not present

## 2015-05-24 DIAGNOSIS — R51 Headache: Secondary | ICD-10-CM | POA: Insufficient documentation

## 2015-05-24 DIAGNOSIS — R531 Weakness: Secondary | ICD-10-CM | POA: Diagnosis present

## 2015-05-24 LAB — COMPREHENSIVE METABOLIC PANEL
ALT: 9 U/L — AB (ref 17–63)
AST: 15 U/L (ref 15–41)
Albumin: 2.6 g/dL — ABNORMAL LOW (ref 3.5–5.0)
Alkaline Phosphatase: 28 U/L — ABNORMAL LOW (ref 38–126)
Anion gap: 5 (ref 5–15)
BUN: 7 mg/dL (ref 6–20)
CHLORIDE: 116 mmol/L — AB (ref 101–111)
CO2: 19 mmol/L — AB (ref 22–32)
CREATININE: 0.75 mg/dL (ref 0.61–1.24)
Calcium: 6.2 mg/dL — CL (ref 8.9–10.3)
GFR calc Af Amer: >60 mL/min (ref >60)
GFR calc non Af Amer: >60 mL/min (ref >60)
GLUCOSE: 68 mg/dL (ref 65–99)
Potassium: 2.9 mmol/L — ABNORMAL LOW (ref 3.5–5.1)
SODIUM: 140 mmol/L (ref 135–145)
Total Bilirubin: 0.3 mg/dL (ref 0.3–1.2)
Total Protein: 4.4 g/dL — ABNORMAL LOW (ref 6.5–8.1)

## 2015-05-24 LAB — CBC WITH DIFFERENTIAL/PLATELET
Basophils Absolute: 0 10*3/uL (ref 0.0–0.1)
Basophils Relative: 1 % (ref 0–1)
EOS ABS: 0.2 10*3/uL (ref 0.0–0.7)
EOS PCT: 5 % (ref 0–5)
HCT: 28.9 % — ABNORMAL LOW (ref 39.0–52.0)
HEMOGLOBIN: 10 g/dL — AB (ref 13.0–17.0)
LYMPHS ABS: 1 10*3/uL (ref 0.7–4.0)
Lymphocytes Relative: 26 % (ref 12–46)
MCH: 29.9 pg (ref 26.0–34.0)
MCHC: 34.6 g/dL (ref 30.0–36.0)
MCV: 86.3 fL (ref 78.0–100.0)
MONOS PCT: 11 % (ref 3–12)
Monocytes Absolute: 0.4 10*3/uL (ref 0.1–1.0)
NEUTROS PCT: 57 % (ref 43–77)
Neutro Abs: 2.1 10*3/uL (ref 1.7–7.7)
Platelets: 135 10*3/uL — ABNORMAL LOW (ref 150–400)
RBC: 3.35 MIL/uL — ABNORMAL LOW (ref 4.22–5.81)
RDW: 12.1 % (ref 11.5–15.5)
WBC: 3.7 10*3/uL — ABNORMAL LOW (ref 4.0–10.5)

## 2015-05-24 LAB — URINALYSIS, ROUTINE W REFLEX MICROSCOPIC
BILIRUBIN URINE: NEGATIVE
GLUCOSE, UA: NEGATIVE mg/dL
HGB URINE DIPSTICK: NEGATIVE
KETONES UR: NEGATIVE mg/dL
LEUKOCYTES UA: NEGATIVE
Nitrite: NEGATIVE
PH: 6 (ref 5.0–8.0)
PROTEIN: NEGATIVE mg/dL
Specific Gravity, Urine: 1.023 (ref 1.005–1.030)
Urobilinogen, UA: 1 mg/dL (ref 0.0–1.0)

## 2015-05-24 LAB — MAGNESIUM: Magnesium: 2 mg/dL (ref 1.7–2.4)

## 2015-05-24 LAB — POC OCCULT BLOOD, ED: Fecal Occult Bld: NEGATIVE

## 2015-05-24 MED ORDER — SODIUM CHLORIDE 0.9 % IV BOLUS (SEPSIS)
1000.0000 mL | Freq: Once | INTRAVENOUS | Status: AC
  Administered 2015-05-24: 1000 mL via INTRAVENOUS

## 2015-05-24 MED ORDER — METOCLOPRAMIDE HCL 5 MG/ML IJ SOLN
10.0000 mg | Freq: Once | INTRAMUSCULAR | Status: AC
  Administered 2015-05-24: 10 mg via INTRAVENOUS
  Filled 2015-05-24: qty 2

## 2015-05-24 MED ORDER — CALCIUM CARBONATE ANTACID 500 MG PO CHEW
800.0000 mg | CHEWABLE_TABLET | Freq: Once | ORAL | Status: AC
Start: 2015-05-24 — End: 2015-05-24
  Administered 2015-05-24: 800 mg via ORAL
  Filled 2015-05-24: qty 4

## 2015-05-24 MED ORDER — DIPHENHYDRAMINE HCL 50 MG/ML IJ SOLN
25.0000 mg | Freq: Once | INTRAMUSCULAR | Status: AC
  Administered 2015-05-24: 25 mg via INTRAVENOUS
  Filled 2015-05-24: qty 1

## 2015-05-24 MED ORDER — POTASSIUM CHLORIDE CRYS ER 20 MEQ PO TBCR: 20.0000 meq | EXTENDED_RELEASE_TABLET | Freq: Every day | ORAL | Status: DC

## 2015-05-24 MED ORDER — CALCIUM GLUCONATE 10 % IV SOLN
1.0000 g | Freq: Once | INTRAVENOUS | Status: DC
  Filled 2015-05-24: qty 10

## 2015-05-24 MED ORDER — POTASSIUM CHLORIDE CRYS ER 20 MEQ PO TBCR
40.0000 meq | EXTENDED_RELEASE_TABLET | Freq: Once | ORAL | Status: AC
  Administered 2015-05-24: 40 meq via ORAL
  Filled 2015-05-24: qty 2

## 2015-05-24 MED ORDER — LEVETIRACETAM 1000 MG PO TABS: 1000.0000 mg | ORAL_TABLET | Freq: Two times a day (BID) | ORAL | Status: DC

## 2015-05-24 NOTE — ED Notes (Signed)
Patient transported to CT 

## 2015-05-24 NOTE — ED Provider Notes (Signed)
Received care from Dr. Criss Alvine, please see his note for further details. In brief this is a patient who is complaining of dizziness headache and weakness for a couple weeks. Found to have hypokalemia hypocalcemia and decreased albumin.  Awaiting ionized calcium.  Called the laboratory as it has been 6 hours since the initial lab was drawn. They said this lab is a send out and will take at least 24 hours return. Will replenish calcium via IV and discharge the patient home.  Melene Plan, DO 05/24/15 1717

## 2015-05-24 NOTE — ED Notes (Signed)
Pt given urinal and was made aware we need a urine sample.

## 2015-05-24 NOTE — ED Notes (Signed)
Patient was seen at [facility] yesterday for seizure activity. Patient was discharged and went to work today, but felt weak and was worried about another seizure. Patient called EMS from home. He was recently taken off of dilantin and he currently does not want dilantin. Patient is currently on keppra. Patient did not have a seizure today, but wants to be evaluated.

## 2015-05-24 NOTE — ED Provider Notes (Signed)
CSN: [id number]     Arrival date & time 05/24/15  1006 History   First MD Initiated Contact with Patient 05/24/15 1023     Chief Complaint  Patient presents with  . Weakness     (Consider location/radiation/quality/duration/timing/severity/associated sxs/prior Treatment) HPI  34 year old male presents with a chief complaint of dizziness and headache. Patient has a long-standing history of seizures most recent had a seizure yesterday. He was seen at [facility] for this. He is currently on Keppra for seizures and this is a new medicine over the last 2 weeks. Patient states that when he went to bed last night he was feeling better but when he woke up this morning he developed an acute headache around 7 AM. Had associated lightheadedness and dizziness. Headache has progressively worsened. Headache is occipital in location, feels like someone is punching him. Patient states his headache feels different than headaches he gets frequently. Is concerned that he might be about to have a seizure because a lot of times the headache precedes his seizure. States he was having trouble walking because of how dizzy he was. Went to work this morning but had to leave early because of not feeling well. No vomiting. No blurry vision or neck stiffness. Has not been febrile. Denies any chest pain or shortness of breath.  Past Medical History  Diagnosis Date  . Seizures    Past Surgical History  Procedure Laterality Date  . Vasectomy Bilateral   . Hand surgery     History reviewed. No pertinent family history. Social History  Substance Use Topics  . Smoking status: Former Smoker    Quit date: 07/18/2014  . Smokeless tobacco: None  . Alcohol Use: Yes     Comment: socially    Review of Systems  Constitutional: Negative for fever.  Respiratory: Negative for shortness of breath.   Cardiovascular: Negative for chest pain.  Gastrointestinal: Negative for vomiting.  Neurological: Positive for dizziness,  light-headedness and headaches. Negative for weakness and numbness.  All other systems reviewed and are negative.     Allergies  Review of patient's allergies indicates no known allergies.  Home Medications   Prior to Admission medications   Medication Sig Start Date End Date Taking? Authorizing Provider  acetaminophen (TYLENOL) 500 MG tablet Take 500 mg by mouth every 6 (six) hours as needed for mild pain.    Historical Provider, MD  levETIRAcetam (KEPPRA) 500 MG tablet Take 1 tablet (500 mg total) by mouth 2 (two) times daily. Patient not taking: Reported on 05/12/2015 05/11/15   Trixie Dredge, PA-C  penicillin v potassium (VEETID) 500 MG tablet Take 1 tablet (500 mg total) by mouth 4 (four) times daily. Patient not taking: Reported on 05/12/2015 05/11/15   Trixie Dredge, PA-C  phenytoin (DILANTIN) 100 MG ER capsule Take 1 capsule (100 mg total) by mouth 3 (three) times daily. Patient not taking: Reported on 05/11/2015 10/14/14   Gerhard Munch, MD   BP 129/74 mmHg  Pulse 61  Temp(Src) 98.3 F (36.8 C) (Oral)  Resp 18  Ht 6\' 2"  (1.88 m)  Wt 175 lb (79.379 kg)  BMI 22.46 kg/m2  SpO2 98% Physical Exam  Constitutional: He is oriented to person, place, and time. He appears well-developed and well-nourished.  HENT:  Head: Normocephalic and atraumatic.  Right Ear: External ear normal.  Left Ear: External ear normal.  Nose: Nose normal.  Eyes: EOM are normal. Pupils are equal, round, and reactive to light. Right eye exhibits no discharge. Left eye  exhibits no discharge.  Neck: Normal range of motion. Neck supple.  No meningismus  Cardiovascular: Normal rate, regular rhythm, normal heart sounds and intact distal pulses.   Pulmonary/Chest: Effort normal and breath sounds normal.  Abdominal: Soft. There is no tenderness.  Musculoskeletal: He exhibits no edema.  Neurological: He is alert and oriented to person, place, and time.  CN 2-12 grossly intact. 5/5 strength in all 4 extremities.  Grossly normal sensation. Normal finger to nose. Normal heel to shin  Skin: Skin is warm and dry.  Nursing note and vitals reviewed.   ED Course  Procedures (including critical care time) Labs Review Labs Reviewed  COMPREHENSIVE METABOLIC PANEL - Abnormal; Notable for the following:    Potassium 2.9 (*)    Chloride 116 (*)    CO2 19 (*)    Calcium 6.2 (*)    Total Protein 4.4 (*)    Albumin 2.6 (*)    ALT 9 (*)    Alkaline Phosphatase 28 (*)    All other components within normal limits  CBC WITH DIFFERENTIAL/PLATELET - Abnormal; Notable for the following:    WBC 3.7 (*)    RBC 3.35 (*)    Hemoglobin 10.0 (*)    HCT 28.9 (*)    Platelets 135 (*)    All other components within normal limits  URINALYSIS, ROUTINE W REFLEX MICROSCOPIC (NOT AT [facility])  MAGNESIUM  CALCIUM, IONIZED  POC OCCULT BLOOD, ED    Imaging Review Ct Head Wo Contrast  05/24/2015   CLINICAL DATA:  Recent seizure.  Headache.  EXAM: CT HEAD WITHOUT CONTRAST  TECHNIQUE: Contiguous axial images were obtained from the base of the skull through the vertex without intravenous contrast.  COMPARISON:  None.  FINDINGS: The ventricles are normal in size and configuration. There is no intracranial mass, hemorrhage, extra-axial fluid collection, or midline shift. Gray-white compartments are normal. No acute infarct evident. The bony calvarium appears intact. The mastoid air cells are clear.  IMPRESSION: Study within normal limits.   Electronically Signed   By: Bretta Bang III M.D.   On: 05/24/2015 11:27   I have personally reviewed and evaluated these images and lab results as part of my medical decision-making.   EKG Interpretation   Date/Time:  Tuesday May 24 2015 11:00:43 EDT Ventricular Rate:  63 PR Interval:  147 QRS Duration: 109 QT Interval:  393 QTC Calculation: 402 R Axis:   83 Text Interpretation:  Sinus rhythm ST elev, probable normal early repol  pattern no significant change since yesterday  Confirmed by Jessia Kief  MD,  Haydin Dunn ([id number]) on 05/24/2015 11:02:47 AM      MDM   Final diagnoses:  Hypokalemia  Occipital headache    Patient with a nonspecific progressively worsening headache over the last few hours. Neuro exam is normal and no signs of meningismus. CT head is unremarkable given this was obtained within 4 hours of symptom onset have very low suspicion for missed subarachnoid hemorrhage. No signs of meningitis. Headache resolved with treatment in the ER. Lab work shows mild anemia of unclear etiology. No blood in stool. Also has mild hypokalemia as well as hypocalcemia. I believe the hypocalcemia is likely from low albumin although why he has this is unclear. Magnesium is normal. Will send ionized calcium. If not substantial then feel he is stable for discharge to f/u with PCP. Notes he's had at least 4 seizures in a week while on keppra for last 1-2 weeks. Will increase his dose, recommend he  call neuro for closer f/u. Care transferred to Dr. Adela Lank with ionized Ca pending.    Pricilla Loveless, MD 05/24/15 [id number]

## 2015-05-24 NOTE — ED Notes (Signed)
Lt arm doesn't have good veins.  RN turing off line - will go back in 30 min to stick rt arm.

## 2015-05-24 NOTE — ED Notes (Signed)
I attempted to collect patient labs and was unsuccessful.  I made the nurse aware. 

## 2015-05-25 LAB — CALCIUM, IONIZED: CALCIUM, IONIZED, SERUM: 4.7 mg/dL (ref 4.5–5.6)

## 2015-06-15 ENCOUNTER — Emergency Department (HOSPITAL_COMMUNITY)
Admission: EM | Admit: 2015-06-15 | Discharge: 2015-06-15 | Disposition: A | Discharge status: Home / Self Care | Payer: BLUE CROSS/BLUE SHIELD | Attending: Emergency Medicine | Admitting: Emergency Medicine

## 2015-06-15 DIAGNOSIS — Z792 Long term (current) use of antibiotics: Secondary | ICD-10-CM | POA: Diagnosis not present

## 2015-06-15 DIAGNOSIS — Z87891 Personal history of nicotine dependence: Secondary | ICD-10-CM | POA: Insufficient documentation

## 2015-06-15 DIAGNOSIS — G40909 Epilepsy, unspecified, not intractable, without status epilepticus: Secondary | ICD-10-CM | POA: Diagnosis not present

## 2015-06-15 DIAGNOSIS — R569 Unspecified convulsions: Secondary | ICD-10-CM

## 2015-06-15 DIAGNOSIS — Z79899 Other long term (current) drug therapy: Secondary | ICD-10-CM | POA: Diagnosis not present

## 2015-06-15 LAB — CBG MONITORING, ED: GLUCOSE-CAPILLARY: 87 mg/dL (ref 65–99)

## 2015-06-15 MED ORDER — LEVETIRACETAM 500 MG PO TABS
1000.0000 mg | ORAL_TABLET | Freq: Once | ORAL | Status: AC
  Administered 2015-06-15: 1000 mg via ORAL
  Filled 2015-06-15: qty 2

## 2015-06-15 NOTE — Discharge Instructions (Signed)

## 2015-06-15 NOTE — ED Notes (Addendum)
Per ems pt is from home, hx of seizures, pt takes kepra ( use to take dilatin, does not want to take dilatin anymore). Pt had grand mal seizure lasting approx 3 minutes before ems arrived. Alert and oriented x4 when ems arrived. No postical phase noted, no incontinence. Pt reported he "felt his aura" before he had seizure. Reports increased stress because he got laid off from job. Pt did not fall, reports he sat on floor because he knew seizure was coming.   Pt refused to let ems start IV.   Upon rn assessment, pt refusing to let nurse start IV. md made aware and just requesting cbg checked. Pt complied with cbg check. Pt alert and oriented x4. Denies tongue injury or incontinence. Denies missing any doses of kepra.

## 2015-06-15 NOTE — ED Notes (Signed)
Bed: ZO10 Expected date:  Expected time:  Means of arrival:  Comments: Ems- seizure

## 2015-06-15 NOTE — ED Provider Notes (Signed)
CSN: [id number]     Arrival date & time 06/15/15  1827 History   First MD Initiated Contact with Patient 06/15/15 1839     Chief Complaint  Patient presents with  . Seizures     (Consider location/radiation/quality/duration/timing/severity/associated sxs/prior Treatment) Patient is a 34 y.o. male presenting with seizures. The history is provided by the patient.  Seizures Seizure activity on arrival: yes   Seizure type:  Grand mal Preceding symptoms: aura   Initial focality:  None Episode characteristics: abnormal movements and focal shaking   Return to baseline: yes   Severity:  Moderate Duration:  2 seconds Timing:  Once Progression:  Resolved Recent head injury:  No recent head injuries PTA treatment:  None History of seizures: yes     34 yo M with a chief complaint of a seizure. Patient has a history of seizures states that today he was walking felt his normal aura sat down on the ground and then had a brief generalized tonic-clonic seizure. Patient was picked up by EMS and taken here. Since then patient has become completely back to baseline. Denies biting of tongue or loss of bowel or bladder.  Past Medical History  Diagnosis Date  . Seizures    Past Surgical History  Procedure Laterality Date  . Vasectomy Bilateral   . Hand surgery     No family history on file. Social History  Substance Use Topics  . Smoking status: Former Smoker    Quit date: 07/18/2014  . Smokeless tobacco: Not on file  . Alcohol Use: Yes     Comment: socially    Review of Systems  Constitutional: Negative for fever and chills.  HENT: Negative for congestion and facial swelling.   Eyes: Negative for discharge and visual disturbance.  Respiratory: Negative for shortness of breath.   Cardiovascular: Negative for chest pain and palpitations.  Gastrointestinal: Negative for vomiting, abdominal pain and diarrhea.  Musculoskeletal: Negative for myalgias and arthralgias.  Skin: Negative for  color change and rash.  Neurological: Positive for seizures. Negative for tremors, syncope and headaches.  Psychiatric/Behavioral: Negative for confusion and dysphoric mood.      Allergies  Review of patient's allergies indicates no known allergies.  Home Medications   Prior to Admission medications   Medication Sig Start Date End Date Taking? Authorizing Provider  acetaminophen (TYLENOL) 500 MG tablet Take 500 mg by mouth every 6 (six) hours as needed for mild pain.   Yes Historical Provider, MD  levETIRAcetam (KEPPRA) 1000 MG tablet Take 1 tablet (1,000 mg total) by mouth 2 (two) times daily. 05/24/15  Yes Pricilla Loveless, MD  penicillin v potassium (VEETID) 500 MG tablet Take 1 tablet (500 mg total) by mouth 4 (four) times daily. Patient taking differently: Take 500 mg by mouth 2 (two) times daily.  05/11/15  Yes Trixie Dredge, PA-C  potassium chloride SA (K-DUR,KLOR-CON) 20 MEQ tablet Take 1 tablet (20 mEq total) by mouth daily. Patient not taking: Reported on 06/15/2015 05/24/15   Pricilla Loveless, MD   There were no vitals taken for this visit. Physical Exam  Constitutional: He is oriented to person, place, and time. He appears well-developed and well-nourished.  HENT:  Head: Normocephalic and atraumatic.  Eyes: EOM are normal. Pupils are equal, round, and reactive to light.  Neck: Normal range of motion. Neck supple. No JVD present.  Cardiovascular: Normal rate and regular rhythm.  Exam reveals no gallop and no friction rub.   No murmur heard. Pulmonary/Chest: No respiratory distress. He  has no wheezes.  Abdominal: He exhibits no distension. There is no rebound and no guarding.  Musculoskeletal: Normal range of motion.  Neurological: He is alert and oriented to person, place, and time. Coordination and gait normal. GCS eye subscore is 4. GCS verbal subscore is 5. GCS motor subscore is 6.  Skin: No rash noted. No pallor.  Psychiatric: He has a normal mood and affect. His behavior is  normal.    ED Course  Procedures (including critical care time) Labs Review Labs Reviewed  CBG MONITORING, ED    Imaging Review No results found. I have personally reviewed and evaluated these images and lab results as part of my medical decision-making.   EKG Interpretation None      MDM   Final diagnoses:  Seizure    34 yo M with a history of a seizure disorder who had a seizure. Patient currently back to baseline. Refusing blood work. On Keppra off her oral Keppra load. We'll have the patient follow with his neurologist.   I have discussed the diagnosis/risks/treatment options with the patient and family and believe the pt to be eligible for discharge home to follow-up with Neuro. We also discussed returning to the ED immediately if new or worsening sx occur. We discussed the sx which are most concerning (e.g., repeat event) that necessitate immediate return. Medications administered to the patient during their visit and any new prescriptions provided to the patient are listed below.  Medications given during this visit Medications  levETIRAcetam (KEPPRA) tablet 1,000 mg (1,000 mg Oral Given 06/15/15 1854)    Discharge Medication List as of 06/15/2015  7:10 PM       The patient appears reasonably screen and/or stabilized for discharge and I doubt any other medical condition or other [facility] requiring further screening, evaluation, or treatment in the ED at this time prior to discharge.      Melene Plan, DO 06/15/15 2341

## 2015-06-15 NOTE — ED Notes (Signed)
Refusing blood draws, states, "let me go, i'm fine now"  Requesting dinner and his fiance. Made aware that when his fiance arrives she will be allow to join him in room.

## 2015-06-21 ENCOUNTER — Ambulatory Visit: Payer: BLUE CROSS/BLUE SHIELD | Admitting: Neurology

## 2015-07-10 ENCOUNTER — Emergency Department (HOSPITAL_COMMUNITY): Payer: Medicaid Other

## 2015-07-10 ENCOUNTER — Emergency Department (HOSPITAL_COMMUNITY)
Admission: EM | Admit: 2015-07-10 | Discharge: 2015-07-11 | Disposition: A | Discharge status: Home / Self Care | Payer: Medicaid Other | Attending: Emergency Medicine | Admitting: Emergency Medicine

## 2015-07-10 ENCOUNTER — Emergency Department (HOSPITAL_COMMUNITY)
Admission: EM | Admit: 2015-07-10 | Discharge: 2015-07-10 | Disposition: A | Discharge status: Home / Self Care | Payer: Medicaid Other | Attending: Emergency Medicine | Admitting: Emergency Medicine

## 2015-07-10 ENCOUNTER — Encounter (HOSPITAL_COMMUNITY): Payer: Self-pay | Admitting: General Practice

## 2015-07-10 ENCOUNTER — Encounter (HOSPITAL_COMMUNITY): Payer: Self-pay | Admitting: Emergency Medicine

## 2015-07-10 ENCOUNTER — Other Ambulatory Visit: Payer: Self-pay

## 2015-07-10 DIAGNOSIS — Y9289 Other specified places as the place of occurrence of the external cause: Secondary | ICD-10-CM | POA: Insufficient documentation

## 2015-07-10 DIAGNOSIS — Y998 Other external cause status: Secondary | ICD-10-CM | POA: Diagnosis not present

## 2015-07-10 DIAGNOSIS — Z79899 Other long term (current) drug therapy: Secondary | ICD-10-CM | POA: Insufficient documentation

## 2015-07-10 DIAGNOSIS — R531 Weakness: Secondary | ICD-10-CM | POA: Diagnosis not present

## 2015-07-10 DIAGNOSIS — Z720 Tobacco use: Secondary | ICD-10-CM | POA: Insufficient documentation

## 2015-07-10 DIAGNOSIS — Z792 Long term (current) use of antibiotics: Secondary | ICD-10-CM | POA: Diagnosis not present

## 2015-07-10 DIAGNOSIS — G40909 Epilepsy, unspecified, not intractable, without status epilepticus: Secondary | ICD-10-CM | POA: Insufficient documentation

## 2015-07-10 DIAGNOSIS — R197 Diarrhea, unspecified: Secondary | ICD-10-CM | POA: Diagnosis not present

## 2015-07-10 DIAGNOSIS — S0990XA Unspecified injury of head, initial encounter: Secondary | ICD-10-CM | POA: Diagnosis present

## 2015-07-10 DIAGNOSIS — W19XXXA Unspecified fall, initial encounter: Secondary | ICD-10-CM

## 2015-07-10 DIAGNOSIS — W01198A Fall on same level from slipping, tripping and stumbling with subsequent striking against other object, initial encounter: Secondary | ICD-10-CM | POA: Diagnosis not present

## 2015-07-10 DIAGNOSIS — R200 Anesthesia of skin: Secondary | ICD-10-CM | POA: Insufficient documentation

## 2015-07-10 DIAGNOSIS — S29009A Unspecified injury of muscle and tendon of unspecified wall of thorax, initial encounter: Secondary | ICD-10-CM | POA: Diagnosis not present

## 2015-07-10 DIAGNOSIS — R42 Dizziness and giddiness: Secondary | ICD-10-CM | POA: Insufficient documentation

## 2015-07-10 DIAGNOSIS — S199XXA Unspecified injury of neck, initial encounter: Secondary | ICD-10-CM | POA: Diagnosis not present

## 2015-07-10 DIAGNOSIS — S3992XA Unspecified injury of lower back, initial encounter: Secondary | ICD-10-CM | POA: Insufficient documentation

## 2015-07-10 DIAGNOSIS — Y9389 Activity, other specified: Secondary | ICD-10-CM | POA: Insufficient documentation

## 2015-07-10 DIAGNOSIS — R079 Chest pain, unspecified: Secondary | ICD-10-CM | POA: Insufficient documentation

## 2015-07-10 DIAGNOSIS — R569 Unspecified convulsions: Secondary | ICD-10-CM

## 2015-07-10 LAB — CBC WITH DIFFERENTIAL/PLATELET
BASOS PCT: 2 %
Basophils Absolute: 0.1 10*3/uL (ref 0.0–0.1)
EOS PCT: 11 %
Eosinophils Absolute: 0.3 10*3/uL (ref 0.0–0.7)
HEMATOCRIT: 37.9 % — AB (ref 39.0–52.0)
Hemoglobin: 13.2 g/dL (ref 13.0–17.0)
LYMPHS ABS: 1.1 10*3/uL (ref 0.7–4.0)
Lymphocytes Relative: 44 %
MCH: 28.9 pg (ref 26.0–34.0)
MCHC: 34.8 g/dL (ref 30.0–36.0)
MCV: 82.9 fL (ref 78.0–100.0)
MONO ABS: 0.3 10*3/uL (ref 0.1–1.0)
MONOS PCT: 11 %
NEUTROS PCT: 32 %
Neutro Abs: 0.8 10*3/uL — ABNORMAL LOW (ref 1.7–7.7)
PLATELETS: 237 10*3/uL (ref 150–400)
RBC: 4.57 MIL/uL (ref 4.22–5.81)
RDW: 11.8 % (ref 11.5–15.5)
WBC: 2.6 10*3/uL — ABNORMAL LOW (ref 4.0–10.5)

## 2015-07-10 LAB — BASIC METABOLIC PANEL
ANION GAP: 6 (ref 5–15)
BUN: 9 mg/dL (ref 6–20)
CO2: 27 mmol/L (ref 22–32)
Calcium: 9.2 mg/dL (ref 8.9–10.3)
Chloride: 105 mmol/L (ref 101–111)
Creatinine, Ser: 1.05 mg/dL (ref 0.61–1.24)
GFR calc Af Amer: >60 mL/min (ref >60)
GLUCOSE: 97 mg/dL (ref 65–99)
POTASSIUM: 4.4 mmol/L (ref 3.5–5.1)
Sodium: 138 mmol/L (ref 135–145)

## 2015-07-10 LAB — CBG MONITORING, ED
Glucose-Capillary: 76 mg/dL (ref 65–99)
Glucose-Capillary: 93 mg/dL (ref 65–99)

## 2015-07-10 LAB — I-STAT TROPONIN, ED: TROPONIN I, POC: 0.01 ng/mL (ref 0.00–0.08)

## 2015-07-10 MED ORDER — LEVETIRACETAM 500 MG PO TABS
1000.0000 mg | ORAL_TABLET | Freq: Two times a day (BID) | ORAL | Status: DC
Start: 2015-07-10 — End: 2015-07-10

## 2015-07-10 MED ORDER — IBUPROFEN 800 MG PO TABS
800.0000 mg | ORAL_TABLET | Freq: Once | ORAL | Status: AC
  Administered 2015-07-10: 800 mg via ORAL
  Filled 2015-07-10: qty 1

## 2015-07-10 MED ORDER — LEVETIRACETAM 500 MG PO TABS
1000.0000 mg | ORAL_TABLET | Freq: Once | ORAL | Status: AC
  Administered 2015-07-10: 1000 mg via ORAL
  Filled 2015-07-10 (×2): qty 2

## 2015-07-10 MED ORDER — HYDROMORPHONE HCL 1 MG/ML IJ SOLN
1.0000 mg | Freq: Once | INTRAMUSCULAR | Status: AC
  Administered 2015-07-10: 1 mg via INTRAVENOUS
  Filled 2015-07-10: qty 1

## 2015-07-10 MED ORDER — LEVETIRACETAM 500 MG PO TABS: 500.0000 mg | ORAL_TABLET | Freq: Two times a day (BID) | ORAL | Status: DC

## 2015-07-10 MED ORDER — LEVETIRACETAM 500 MG PO TABS
500.0000 mg | ORAL_TABLET | Freq: Once | ORAL | Status: AC
  Administered 2015-07-10: 500 mg via ORAL
  Filled 2015-07-10: qty 1

## 2015-07-10 MED ORDER — LEVETIRACETAM 1000 MG PO TABS: 1000.0000 mg | ORAL_TABLET | Freq: Two times a day (BID) | ORAL | Status: DC

## 2015-07-10 NOTE — ED Notes (Signed)
Pt brought in via [facility]. Pts significant other witnessed seizure like activity followed by a fall, and called EMS. When EMS arrived pt was not postitcal, and reporting remembering hitting his head. EMS placed pt in C-collar and on a spinal board. Pt also reporting numbness in feet bilaterally, that he noticed when he woke up. Pt is A/O. Pt also reporting some chest tightness, rating pain a 8/10 in his center chest. Pt has a history of epilepsy and takes keppra.

## 2015-07-10 NOTE — ED Notes (Signed)
Pt arrives via EMS with c/o multiple seizures today, total of 3 witnessed events. Was seen earlier in our facility for the same, states he takes keppra. Unable to report to RN what his seizures look like. Pt also c/o diarrhea ongoing for a week. Pt drowsy but alert at this time, able to answer all questions appropriately. Positive post ictal phase with EMS. Also reports chest pain, 9/10.

## 2015-07-10 NOTE — ED Provider Notes (Signed)
CSN: [id number]     Arrival date & time 07/10/15  4098 History   First MD Initiated Contact with Patient 07/10/15 [phone]     Chief Complaint  Patient presents with  . Fall     HPI   Keith Chung is a 34 y.o. male with a PMH of seizures who presents to the ED s/p fall. He states his significant other witnessed seizure activity, followed by a fall. The patient reports he remembers falling, but does not remember anything else. He reports he hit his head, and currently has pain to his head, neck, and back. He also states he has "black spots" in his vision and reports lightheadedness and dizziness. He reports chest pain and numbness in his feet, which he noticed after waking up. He denies shortness of breath, abdominal pain, N/V.   Past Medical History  Diagnosis Date  . Seizures [facility])    Past Surgical History  Procedure Laterality Date  . Vasectomy Bilateral   . Hand surgery     No family history on file. Social History  Substance Use Topics  . Smoking status: Light Tobacco Smoker -- 0.30 packs/day    Types: Cigarettes    Last Attempt to Quit: 07/18/2014  . Smokeless tobacco: None  . Alcohol Use: 3.0 oz/week    5 Cans of beer per week     Comment: socially    Review of Systems  Respiratory: Negative for shortness of breath.   Cardiovascular: Positive for chest pain.  Gastrointestinal: Negative for nausea, vomiting and abdominal pain.  Musculoskeletal: Positive for myalgias, back pain, arthralgias, neck pain and neck stiffness.  Neurological: Positive for dizziness, weakness, light-headedness, numbness and headaches.  All other systems reviewed and are negative.     Allergies  Review of patient's allergies indicates no known allergies.  Home Medications   Prior to Admission medications   Medication Sig Start Date End Date Taking? Authorizing Provider  acetaminophen (TYLENOL) 500 MG tablet Take 500 mg by mouth every 6 (six) hours as needed for mild pain.   Yes  Historical Provider, MD  levETIRAcetam (KEPPRA) 1000 MG tablet Take 1 tablet (1,000 mg total) by mouth 2 (two) times daily. 05/24/15  Yes Pricilla Loveless, MD  penicillin v potassium (VEETID) 500 MG tablet Take 1 tablet (500 mg total) by mouth 4 (four) times daily. Patient taking differently: Take 500 mg by mouth 2 (two) times daily.  05/11/15  Yes Emily West, PA-C    BP 128/81 mmHg  Pulse 47  Temp(Src) 97.8 F (36.6 C) (Oral)  Resp 16  Ht  (1.88 m)  Wt 175 lb (79.379 kg)  BMI 22.46 kg/m2  SpO2 100% Physical Exam  Constitutional: He is oriented to person, place, and time. He appears well-developed and well-nourished. No distress.  HENT:  Head: Normocephalic and atraumatic.  Right Ear: External ear normal.  Left Ear: External ear normal.  Nose: Nose normal.  Mouth/Throat: Uvula is midline, oropharynx is clear and moist and mucous membranes are normal.  Eyes: Conjunctivae, EOM and lids are normal. Pupils are equal, round, and reactive to light. Right eye exhibits no discharge. Left eye exhibits no discharge. No scleral icterus.  Neck: Normal range of motion. Neck supple. Spinous process tenderness and muscular tenderness present.  Cervical spine and paraspinal muscle tender to palpation. No step-off or deformity. Patient in cervical collar.  Cardiovascular: Normal rate, regular rhythm, normal heart sounds, intact distal pulses and normal pulses.   Pulmonary/Chest: Effort normal and breath sounds  normal. No respiratory distress. He has no wheezes. He has no rales. He exhibits tenderness and bony tenderness. He exhibits no crepitus, no edema, no deformity, no swelling and no retraction.  TTP of left anterior chest wall.  Abdominal: Soft. Normal appearance and bowel sounds are normal. He exhibits no distension and no mass. There is no tenderness. There is no rigidity, no rebound and no guarding.  Musculoskeletal: Normal range of motion. He exhibits no edema or tenderness.  TTP over thoracic  and lumbar spine and paraspinal muscles. No palpable step off or deformity.  Neurological: He is alert and oriented to person, place, and time. He has normal strength and normal reflexes. No cranial nerve deficit or sensory deficit. GCS eye subscore is 4. GCS verbal subscore is 5. GCS motor subscore is 6.  Patient ambulates without difficulty.  Skin: Skin is warm, dry and intact. No rash noted. He is not diaphoretic. No erythema. No pallor.  Psychiatric: He has a normal mood and affect. His speech is normal and behavior is normal.  Nursing note and vitals reviewed.   ED Course  Procedures (including critical care time)  Labs Review Labs Reviewed  CBC WITH DIFFERENTIAL/PLATELET - Abnormal; Notable for the following:    WBC 2.6 (*)    HCT 37.9 (*)    Neutro Abs 0.8 (*)    All other components within normal limits  BASIC METABOLIC PANEL  I-STAT TROPOININ, ED  CBG MONITORING, ED    Imaging Review Dg Chest 1 View  07/10/2015  CLINICAL DATA:  Witnessed seizure followed by a fall EXAM: CHEST 1 VIEW COMPARISON:  05/12/2015 FINDINGS: Minimally prominent cardiac silhouette likely accentuated by AP supine technique. Mediastinal contours and pulmonary vascularity normal. Lungs clear. No pleural effusion or pneumothorax. Bones unremarkable. IMPRESSION: No acute abnormalities. Electronically Signed   By: Ulyses Southward M.D.   On: 07/10/2015 10:28   Dg Thoracic Spine 2 View  07/10/2015  CLINICAL DATA:  34 year old male with acute thoracic spine pain following fall. EXAM: THORACIC SPINE 2 VIEWS COMPARISON:  05/12/2015 chest radiograph FINDINGS: There is no evidence of thoracic spine fracture. Alignment is normal. No other significant bone abnormalities are identified. IMPRESSION: Negative. Electronically Signed   By: Harmon Pier M.D.   On: 07/10/2015 10:28   Dg Lumbar Spine Complete  07/10/2015  CLINICAL DATA:  Seizure followed by fall with back pain. EXAM: LUMBAR SPINE - COMPLETE 4+ VIEW COMPARISON:   None. FINDINGS: Vertebral body alignment, heights and disc space heights are normal. There is no compression fracture or subluxation. IMPRESSION: Negative. Electronically Signed   By: Elberta Fortis M.D.   On: 07/10/2015 10:27   Ct Head Wo Contrast  07/10/2015  CLINICAL DATA:  Witnessed seizure followed by a fall, struck back of head, posterior neck pain EXAM: CT HEAD WITHOUT CONTRAST CT CERVICAL SPINE WITHOUT CONTRAST TECHNIQUE: Multidetector CT imaging of the head and cervical spine was performed following the standard protocol without intravenous contrast. Multiplanar CT image reconstructions of the cervical spine were also generated. COMPARISON:  CT head 05/24/2015 FINDINGS: CT HEAD FINDINGS Normal ventricular morphology. No midline shift or mass effect. Normal appearance of brain parenchyma. No intracranial hemorrhage, mass lesion, or acute infarction. Visualized paranasal sinuses and mastoid air cells clear. Bones unremarkable. CT CERVICAL SPINE FINDINGS Prevertebral soft tissues normal thickness. Osseous mineralization normal. Visualized skullbase intact. Vertebral body and disc space heights maintained. No acute fracture, subluxation or bone destruction. Lung apices clear. IMPRESSION: Normal CT head. Normal CT cervical spine. Electronically  Signed   By: Ulyses SouthwardMark  Boles M.D.   On: 07/10/2015 10:16   Ct Cervical Spine Wo Contrast  07/10/2015  CLINICAL DATA:  Witnessed seizure followed by a fall, struck back of head, posterior neck pain EXAM: CT HEAD WITHOUT CONTRAST CT CERVICAL SPINE WITHOUT CONTRAST TECHNIQUE: Multidetector CT imaging of the head and cervical spine was performed following the standard protocol without intravenous contrast. Multiplanar CT image reconstructions of the cervical spine were also generated. COMPARISON:  CT head 05/24/2015 FINDINGS: CT HEAD FINDINGS Normal ventricular morphology. No midline shift or mass effect. Normal appearance of brain parenchyma. No intracranial hemorrhage,  mass lesion, or acute infarction. Visualized paranasal sinuses and mastoid air cells clear. Bones unremarkable. CT CERVICAL SPINE FINDINGS Prevertebral soft tissues normal thickness. Osseous mineralization normal. Visualized skullbase intact. Vertebral body and disc space heights maintained. No acute fracture, subluxation or bone destruction. Lung apices clear. IMPRESSION: Normal CT head. Normal CT cervical spine. Electronically Signed   By: Ulyses SouthwardMark  Boles M.D.   On: 07/10/2015 10:16     I have personally reviewed and evaluated these images and lab results as part of my medical decision-making.   EKG Interpretation None      MDM   Final diagnoses:  Fall    34 year old male presents with witnessed seizure activity and fall. He reports headache, black spots in his vision, dizziness, lightheadedness, neck pain, back pain, chest pain, numbness in his feet. Patient's fiance present at bedside, who reports the patient had seizure activity and was "mostly moving his upper body" for approximately 12 minutes. She states he had seizure activity again with EMS. Patient reports he did not take his keppra on Friday or Saturday. He states his last dose was this morning, and that it was his last tablet.  Patient is afebrile. Vital signs stable. Heart regular rate and rhythm. Lungs clear to auscultation bilaterally. Mild tenderness to palpation of left anterior chest wall. Abdomen soft, nontender, nondistended. No tenderness to palpation of extremities. Patient moves all 4 extremities without difficulty. Pelvis stable. Tenderness to palpation of cervical, thoracic, lumbar spine and paraspinal muscles. No palpable step-off or deformity. Normal neuro exam with no focal deficit. Strength and sensation intact. DTRs intact. Patient ambulates without difficulty.  Will obtain imaging of head, neck, thoracic and lumbar spine, chest. CBC, BMP unremarkable. EKG no acute ischemia. Troponin negative. CT head negative for  hemorrhage, mass lesion, acute infarction. CT cervical spine negative for fracture, subluxation, or bone destruction. Imaging of thoracic and lumbar spine negative for fracture. Chest x-ray negative for pleural effusion, pneumothorax, bony abnormality.  No seizure activity in the ED. Given keppra 1000 mg.  Patient is well-appearing, feel he is stable for discharge at this time. Patient's keppra dose was increased when he was seen in the ED in September, now takes 1000 mg BID. Given prescription, as patient states he has run out. Patient to follow-up with neurology. Return precautions discussed. Patient verbalizes his understanding and is in agreement with plan.  BP 154/100 mmHg  Pulse 44  Temp(Src) 97.8 F (36.6 C) (Oral)  Resp 10  Ht 6\' 2"  (1.88 m)  Wt 175 lb (79.379 kg)  BMI 22.46 kg/m2  SpO2 92%   Mady Gemmalizabeth C Westfall, PA-C 07/10/15 1647  Lyndal Pulleyaniel Knott, MD 07/11/15 [phone]

## 2015-07-10 NOTE — ED Provider Notes (Signed)
CSN: [id number]   Arrival date & time 07/10/15 2255  History  By signing my name below, I, Keith Chung, attest that this documentation has been prepared under the direction and in the presence of Dione Booze, MD. Electronically Signed: Bethel Chung, ED Scribe. 07/10/2015. 11:33 PM.  Chief Complaint  Patient presents with  . Seizures    HPI The history is provided by the patient. No language interpreter was used.   Brought in by EMS, Keith Chung is a 34 y.o. male with history of seizures who presents to the Emergency Department complaining of seizure-activity PTA. Pt was discharged this afternoon after being dosed with Keppra but he had another seizure at home. Pt is unable to describe the seizure but notes that he bit his tongue. No incontinence of bowel or bladder.  He missed 2 days of Keppra before today. Pt notes that he used his last dose of Keppra this morning. Also complains of central chest tightness(8/10 in severity), diarrhea for 1 week, and chills. Pt denies fever, abdominal pain, and vomiting. He has no neurologist or PCP.   Past Medical History  Diagnosis Date  . Seizures [facility])     Past Surgical History  Procedure Laterality Date  . Vasectomy Bilateral   . Hand surgery      No family history on file.  Social History  Substance Use Topics  . Smoking status: Light Tobacco Smoker -- 0.30 packs/day    Types: Cigarettes    Last Attempt to Quit: 07/18/2014  . Smokeless tobacco: None  . Alcohol Use: 3.0 oz/week    5 Cans of beer per week     Comment: socially     Review of Systems  Constitutional: Positive for chills. Negative for fever.  Respiratory: Positive for chest tightness.   Gastrointestinal: Positive for diarrhea.  Neurological: Positive for seizures.  All other systems reviewed and are negative.  Home Medications   Prior to Admission medications   Medication Sig Start Date End Date Taking? Authorizing Provider  levETIRAcetam (KEPPRA) 1000  MG tablet Take 1 tablet (1,000 mg total) by mouth 2 (two) times daily. 07/10/15  Yes [name], PA-C  penicillin v potassium (VEETID) 500 MG tablet Take 1 tablet (500 mg total) by mouth 4 (four) times daily. Patient not taking: Reported on 07/10/2015 05/11/15   Trixie Dredge, PA-C    Allergies  Review of patient's allergies indicates no known allergies.  Triage Vitals: BP 128/82 mmHg  Pulse 54  Temp(Src) 98.1 F (36.7 C) (Oral)  Resp 14  Ht  (1.88 m)  Wt 175 lb (79.379 kg)  BMI 22.46 kg/m2  SpO2 98%  Physical Exam  Constitutional: He is oriented to person, place, and time. He appears well-developed and well-nourished.  HENT:  Head: Normocephalic and atraumatic.  Eyes: EOM are normal. Pupils are equal, round, and reactive to light.  Neck: Normal range of motion. Neck supple. No JVD present.  Cardiovascular: Normal rate, regular rhythm, normal heart sounds and intact distal pulses.   No murmur heard. Pulmonary/Chest: Effort normal and breath sounds normal. He has no wheezes. He has no rales. He exhibits no tenderness.  Abdominal: Soft. Bowel sounds are normal. He exhibits no distension and no mass. There is no tenderness.  Musculoskeletal: Normal range of motion. He exhibits no edema.  Lymphadenopathy:    He has no cervical adenopathy.  Neurological: He is alert and oriented to person, place, and time.  Skin: Skin is warm and dry. No rash noted.  Psychiatric: He has a normal mood and affect. His behavior is normal. Judgment and thought content normal.  Nursing note and vitals reviewed.   ED Course  Procedures   DIAGNOSTIC STUDIES: Oxygen Saturation is 98% on RA, normal by my interpretation.    COORDINATION OF CARE: 11:30 PM Discussed treatment plan which includes lab work, EKG, Keppra, and ibuprofen with pt at bedside and pt agreed to plan.  Results for orders placed or performed during the hospital encounter of 07/10/15  Troponin I  Result Value Ref Range    Troponin I <0.03 <0.031 ng/mL  CBG monitoring, ED  Result Value Ref Range   Glucose-Capillary 93 65 - 99 mg/dL   I personally reviewed and evaluated these lab results as a part of my medical decision-making.   EKG Interpretation   Date/Time:  Sunday July 10 2015 23:02:29 EDT Ventricular Rate:  57 PR Interval:  150 QRS Duration: 111 QT Interval:  418 QTC Calculation: 407 R Axis:   85 Text Interpretation:  Sinus rhythm Incomplete left bundle branch block ST  elev, probable normal early repol pattern When compared with ECG of  06/15/2015, No significant change was found Confirmed by [facility]  MD, [name]  ([id number]) on 07/10/2015 11:14:51 PM        MDM   Final diagnoses:  Seizure (HCC)  Chest pain, unspecified    Seizure in patient with history of noncompliance. He states that he had missed 2 days of his medication, but took his dose this morning. Review of prior ED record shows that he was given an additional dose of Keppra in the ED. He'll be given additional 500 mg of Keppra here. Chest pain is of uncertain cause but I do not see any red flags to suggest serious causes of chest pain. He had a negative troponin earlier. Troponin is repeated and is negative once again. He had chest x-ray done earlier and there is no need to repeat that chest x-ray. I'm concerned that he does not have appropriate follow-up. He is given resource guide to try to find primary care and is [facility]. I suspect that his major problem is noncompliance. But if he is truly having seizures in spite of maximum doses of Keppra, he may need additional anticonvulsants.   I, Nyjai Graff, personally performed the services described in this documentation. All medical record entries made by the scribe were at my direction and in my presence.  I have reviewed the chart and discharge instructions and agree that the record reflects my personal performance and is accurate and complete. [name].   07/11/2015. 12:55 AM.      [name], MD 07/11/15 [phone]

## 2015-07-10 NOTE — Discharge Instructions (Signed)
1. Medications: usual home medications 2. Treatment: rest, drink plenty of fluids 3. Follow Up: please followup with your primary doctor for discussion of your diagnoses and further evaluation after today's visit; if you do not have a primary care doctor use the resource guide provided to find one; please return to the ER for seizures, high fever, severe pain, new or worsening symptoms   Emergency Department Resource Guide 1) Find a Doctor and Pay Out of Pocket Although you won't have to find out who is covered by your insurance plan, it is a good idea to ask around and get recommendations. You will then need to call the office and see if the doctor you have chosen will accept you as a new patient and what types of options they offer for patients who are self-pay. Some doctors offer discounts or will set up payment plans for their patients who do not have insurance, but you will need to ask so you aren't surprised when you get to your appointment.  2) Contact Your Local Health Department Not all health departments have doctors that can see patients for sick visits, but many do, so it is worth a call to see if yours does. If you don't know where your local health department is, you can check in your phone book. The CDC also has a tool to help you locate your state's health department, and many state websites also have listings of all of their local health departments.  3) Find a Walk-in Clinic If your illness is not likely to be very severe or complicated, you may want to try a walk in clinic. These are popping up all over the country in pharmacies, drugstores, and shopping centers. They're usually staffed by nurse practitioners or physician assistants that have been trained to treat common illnesses and complaints. They're usually fairly quick and inexpensive. However, if you have serious medical issues or chronic medical problems, these are probably not your best option.  No Primary Care  Doctor: - Call Health Connect at  [phone] - they can help you locate a primary care doctor that  accepts your insurance, provides certain services, etc. - Physician Referral Service- [phone]  Chronic Pain Problems: Organization         Address  Phone   Notes  [facility]  [phone] Patients need to be referred by their primary care doctor.   Medication Assistance: Organization         Address  Phone   Notes  [facility] [address] [place], [place] [id number] [phone] --Must be a resident of [facility] [place] -- Must have NO insurance coverage whatsoever (no Medicaid/ Medicare, etc.) -- The pt. MUST have a primary care doctor that directs their care regularly and follows them in the community   MedAssist  [phone]   [employer]  [phone]    Agencies that provide inexpensive medical care: Organization         Address  Phone   Notes  [facility]  [phone]   [facility]    [phone]   [facility] [address] [place], [place] [address] [phone]   [facility] [address], [place] [phone]   Planned Parenthood    [phone]   [facility]    [phone]   [facility]  [address],  [place] Phone:  [phone], Fax:  [phone] Hours of Operation:  9 am - 6 pm, M-F.  Also accepts Medicaid/Medicare and self-pay.  [facility] for [place], [address], [place] Phone: [phone], Fax: [phone]. Hours of Operation:  8:30 am - 5:30 pm, M-F.  Also accepts Medicaid and self-pay.  [facility] [place] [address] [place] Phone: [phone]   [place], [place], [place] [phone], Ext. [phone] Mondays & Thursdays: 7-9 AM.  First 15 patients are seen on a first  come, first serve basis.    [place] Providers:  Organization         Address  Phone   Notes  [facility] [address], [place] [phone] Also accepts self-pay patients.  [facility] [address], [place]  [phone]   [place], [address], [place] [phone]   [facility] [address] [place] [phone]   Lucianne Lei [address], [place]   [phone] Only accepts [place] Access [place] patients after they have their name applied to their card.   Self-Pay (no insurance) in [facility]:  Organization         Address  Phone   Notes  Sickle Cell Patients, [facility] [place] [phone]   [facility] [place] [phone]   [facility] [place]  [place], [place], [place] [phone]   [facility]/Dr. Osei-Bonsu  [address], [place] or [address], [place] [phone] Phone number for both [place] and [facility] locations is the same.  [facility] [address], [place] [phone]   [facility] [address], [place] or [address] [phone]   [facility] [address], [place] [phone], phone; [phone], fax Sees patients 1st and 3rd Saturday of every month.  Must not qualify for public or private insurance (i.e. Medicaid, Medicare, [place] Health Choice, Veterans' Benefits)  Household income should be no more than 200% of the poverty level The clinic cannot treat you if you are pregnant or think you are pregnant  Sexually transmitted diseases are not treated at the clinic.    Dental Care: Organization          Address  Phone  Notes  [facility] Department of [facility] [place] [phone] Accepts children up to age 78 who are enrolled in [place] or [place]; pregnant women with a Medicaid card; and children who have applied for Medicaid or [place] Health Choice, but were declined, whose parents can pay a reduced fee at time of service.  [facility]  [address], [place] [phone] Accepts children up to age 24 who are enrolled in [place] or [place]; pregnant women with a Medicaid card; and children who have applied for Medicaid or [place] Health Choice, but were declined, whose parents can pay a reduced fee at time of service.  [place] Adult [facility] Access PROGRAM  [place] [phone] Patients are seen by  appointment only. Walk-ins are not accepted. [place] will see patients 46 years of age and older. Monday - Tuesday (8am-5pm) Most Wednesdays (8:30-5pm) $30 per visit, cash only  [facility] Access PROGRAM  [address], [facility] [phone] Patients are seen by appointment only. Walk-ins are not accepted. [place] will see patients 56 years of age and older. One Wednesday Evening (Monthly: Volunteer Based).  $30 per visit, cash only  [place]  [phone] for adults; Children under age 81, call Graduate Pediatric Dentistry at [phone]. Children aged 36-14, please call [phone] to request a pediatric application.  Dental services are provided in all areas of dental care including fillings, crowns and bridges, complete and partial dentures, implants, gum treatment, root canals, and extractions. Preventive care is also provided. Treatment is provided to both adults and children. Patients are selected via a lottery and there is often a waiting list.   [facility] [address], [place]  [phone] [email or web address]   [facility] [address] [place], [place] [phone], Ext. [phone] Second and Fourth Thursday of each month, opens at 6:30 AM; Clinic ends at 9 AM.  Patients are seen on a first-come first-served basis, and a limited number are seen during each clinic.   [facility]  [address] [place]ard [place], [place] [phone]   Eligibility Requirements You must have lived in [place], [place], or [place] counties for at least the last three months.   You cannot be eligible for state or federal sponsored [employer], including [employer], [place], or [employer].   You generally cannot be eligible for healthcare insurance through your employer.    How to apply: Eligibility screenings are held every Tuesday and Wednesday afternoon from 1:00 pm until 4:00 pm. You do not need an appointment for the interview!  [facility] [address], [address], [place]   [place]  [place] Department  [place]  [phone]    Behavioral Health Resources in the Community: Intensive Outpatient Programs Organization         Address  Phone  Notes  [place] Thayer. [address] [place], [place] [phone]   [facility] [address] [place], [place]   ADS: Alcohol & Drug Svcs [address], [place], [place]   [place] [address]  [place], [place] or [phone]   Substance Abuse Resources Organization         Address  Phone  Notes  Al[facility] Drug Services  [phone]   [place]  [phone]   [facility] [place]   Chinita Pester  [phone]   Residential & Outpatient Substance Abuse Program  [phone]   Psychological  Services Organization         Address  Phone  Notes  [facility] [place]  [place]  [phone]   [place] [address], [place] [phone] or [phone]    Mobile Crisis Teams Organization         Address  Phone  Notes  [facility], Mobile Crisis Care Unit  [phone]   [facility]  [address] [place], [place]   [facility] [address], [place] [phone]  Self-Help/Support Groups Organization         Address  Phone             Notes  [facility] [place] - variety of support groups  [place] Call for more information  Narcotics Anonymous (NA), [facility] [address], [employer] [place]  2 meetings at this location   Special educational needs teacher         Address  Phone  Notes  ASAP Residential Treatment [place],    [place]  [phone]   [facility]  [address], [place] [id number], [place], [place]   [place], [place] [phone] Admissions: 8am-3pm M-F  Incentives Substance [place] [address],    [place], [place] [phone]   [facility] [address] [place], [place], [place]   [facility] [address]  [place], [place]   [facility]ight Programs - Intensive Outpatient [address], [place], [place]   [facility] [address]  [place], [place] [phone] or [phone]   [facility] (RTS) [address], [place], [place] Accepts Medicaid  [facility] [address],  [place] [phone] Substance Abuse/Addiction Treatment   [facility] Organization         Address  Phone  Notes  [facility]  [phone]   Domenic Schwab, PhD [address] Arlis Porta [place], [place]   [phone] or [phone]   [place], [place] [phone]   [facility] [address], [place], [place] [phone] Insurance/Medicaid/sponsorship through [facility] and Families [address]                                    [place], [place] [phone] [place] [address], [place] [phone]    Dr. Adele Schilder  [phone]   [facility] Dept. 1) [address], [place] 2) [place] 3)  [place] [address], [place] [phone]  [phone]   [place] [phone] or [phone] (After Hours)

## 2015-07-11 LAB — PATHOLOGIST SMEAR REVIEW

## 2015-07-11 LAB — TROPONIN I: Troponin I: <0.03 ng/mL (ref <0.031)

## 2015-07-11 NOTE — Discharge Instructions (Signed)
Make sure to take your Keppra every day. Do not miss any doses. You need to get established with a physician to write ongoing prescriptions for your Keppra. Please follow-up with the neurologist. If you have seizures in spite of taking your Keppra appropriately, you may need to be on additional medication.  Epilepsy Epilepsy is a disorder in which a person has repeated seizures over time. A seizure is a release of abnormal electrical activity in the brain. Seizures can cause a change in attention, behavior, or the ability to remain awake and alert (altered mental status). Seizures often involve uncontrollable shaking (convulsions).  Most people with epilepsy lead normal lives. However, people with epilepsy are at an increased risk of falls, accidents, and injuries. Therefore, it is important to begin treatment right away. CAUSES  Epilepsy has many possible causes. Anything that disturbs the normal pattern of brain cell activity can lead to seizures. This may include:   Head injury.  Birth trauma.  High fever as a child.  Stroke.  Bleeding into or around the brain.  Certain drugs.  Prolonged low oxygen, such as what occurs after CPR efforts.  Abnormal brain development.  Certain illnesses, such as meningitis, encephalitis (brain infection), malaria, and other infections.  An imbalance of nerve signaling chemicals (neurotransmitters).  SIGNS AND SYMPTOMS  The symptoms of a seizure can vary greatly from one person to another. Right before a seizure, you may have a warning (aura) that a seizure is about to occur. An aura may include the following symptoms:  Fear or anxiety.  Nausea.  Feeling like the room is spinning (vertigo).  Vision changes, such as seeing flashing lights or spots. Common symptoms during a seizure include:  Abnormal sensations, such as an abnormal smell or a bitter taste in the mouth.   Sudden, general body stiffness.   Convulsions that involve rhythmic  jerking of the face, arm, or leg on one or both sides.   Sudden change in consciousness.   Appearing to be awake but not responding.   Appearing to be asleep but cannot be awakened.   Grimacing, chewing, lip smacking, drooling, tongue biting, or loss of bowel or bladder control. After a seizure, you may feel sleepy for a while. DIAGNOSIS  Your health care provider will ask about your symptoms and take a medical history. Descriptions from any witnesses to your seizures will be very helpful in the diagnosis. A physical exam, including a detailed neurological exam, is necessary. Various tests may be done, such as:   An electroencephalogram (EEG). This is a painless test of your brain waves. In this test, a diagram is created of your brain waves. These diagrams can be interpreted by a specialist.  An MRI of the brain.   A CT scan of the brain.   A spinal tap (lumbar puncture, LP).  Blood tests to check for signs of infection or abnormal blood chemistry. TREATMENT  There is no cure for epilepsy, but it is generally treatable. Once epilepsy is diagnosed, it is important to begin treatment as soon as possible. For most people with epilepsy, seizures can be controlled with medicines. The following may also be used:  A pacemaker for the brain (vagus nerve stimulator) can be used for people with seizures that are not well controlled by medicine.  Surgery on the brain. For some people, epilepsy eventually goes away. HOME CARE INSTRUCTIONS   Follow your health care provider's recommendations on driving and safety in normal activities.  Get enough rest. Lack  of sleep can cause seizures.  Only take over-the-counter or prescription medicines as directed by your health care provider. Take any prescribed medicine exactly as directed.  Avoid any known triggers of your seizures.  Keep a seizure diary. Record what you recall about any seizure, especially any possible trigger.   Make sure  the people you live and work with know that you are prone to seizures. They should receive instructions on how to help you. In general, a witness to a seizure should:   Cushion your head and body.   Turn you on your side.   Avoid unnecessarily restraining you.   Not place anything inside your mouth.   Call for emergency medical help if there is any question about what has occurred.   Follow up with your health care provider as directed. You may need regular blood tests to monitor the levels of your medicine.  SEEK MEDICAL CARE IF:   You develop signs of infection or other illness. This might increase the risk of a seizure.   You seem to be having more frequent seizures.   Your seizure pattern is changing.  SEEK IMMEDIATE MEDICAL CARE IF:   You have a seizure that does not stop after a few moments.   You have a seizure that causes any difficulty in breathing.   You have a seizure that results in a very severe headache.   You have a seizure that leaves you with the inability to speak or use a part of your body.    This information is not intended to replace advice given to you by your health care provider. Make sure you discuss any questions you have with your health care provider.   Document Released: 09/03/2005 Document Revised: 06/24/2013 Document Reviewed: 04/15/2013 Elsevier Interactive Patient Education 2016 Elsevier Inc.  Nonspecific Chest Pain  Chest pain can be caused by many different conditions. There is always a chance that your pain could be related to something serious, such as a heart attack or a blood clot in your lungs. Chest pain can also be caused by conditions that are not life-threatening. If you have chest pain, it is very important to follow up with your health care provider. CAUSES  Chest pain can be caused by:  Heartburn.  Pneumonia or bronchitis.  Anxiety or stress.  Inflammation around your heart (pericarditis) or lung (pleuritis  or pleurisy).  A blood clot in your lung.  A collapsed lung (pneumothorax). It can develop suddenly on its own (spontaneous pneumothorax) or from trauma to the chest.  Shingles infection (varicella-zoster virus).  Heart attack.  Damage to the bones, muscles, and cartilage that make up your chest wall. This can include:  Bruised bones due to injury.  Strained muscles or cartilage due to frequent or repeated coughing or overwork.  Fracture to one or more ribs.  Sore cartilage due to inflammation (costochondritis). RISK FACTORS  Risk factors for chest pain may include:  Activities that increase your risk for trauma or injury to your chest.  Respiratory infections or conditions that cause frequent coughing.  Medical conditions or overeating that can cause heartburn.  Heart disease or family history of heart disease.  Conditions or health behaviors that increase your risk of developing a blood clot.  Having had chicken pox (varicella zoster). SIGNS AND SYMPTOMS Chest pain can feel like:  Burning or tingling on the surface of your chest or deep in your chest.  Crushing, pressure, aching, or squeezing pain.  Dull or sharp pain  that is worse when you move, cough, or take a deep breath.  Pain that is also felt in your back, neck, shoulder, or arm, or pain that spreads to any of these areas. Your chest pain may come and go, or it may stay constant. DIAGNOSIS Lab tests or other studies may be needed to find the cause of your pain. Your health care provider may have you take a test called an ambulatory ECG (electrocardiogram). An ECG records your heartbeat patterns at the time the test is performed. You may also have other tests, such as:  Transthoracic echocardiogram (TTE). During echocardiography, sound waves are used to create a picture of all of the heart structures and to look at how blood flows through your heart.  Transesophageal echocardiogram (TEE).This is a more  advanced imaging test that obtains images from inside your body. It allows your health care provider to see your heart in finer detail.  Cardiac monitoring. This allows your health care provider to monitor your heart rate and rhythm in real time.  Holter monitor. This is a portable device that records your heartbeat and can help to diagnose abnormal heartbeats. It allows your health care provider to track your heart activity for several days, if needed.  Stress tests. These can be done through exercise or by taking medicine that makes your heart beat more quickly.  Blood tests.  Imaging tests. TREATMENT  Your treatment depends on what is causing your chest pain. Treatment may include:  Medicines. These may include:  Acid blockers for heartburn.  Anti-inflammatory medicine.  Pain medicine for inflammatory conditions.  Antibiotic medicine, if an infection is present.  Medicines to dissolve blood clots.  Medicines to treat coronary artery disease.  Supportive care for conditions that do not require medicines. This may include:  Resting.  Applying heat or cold packs to injured areas.  Limiting activities until pain decreases. HOME CARE INSTRUCTIONS  If you were prescribed an antibiotic medicine, finish it all even if you start to feel better.  Avoid any activities that bring on chest pain.  Do not use any tobacco products, including cigarettes, chewing tobacco, or electronic cigarettes. If you need help quitting, ask your health care provider.  Do not drink alcohol.  Take medicines only as directed by your health care provider.  Keep all follow-up visits as directed by your health care provider. This is important. This includes any further testing if your chest pain does not go away.  If heartburn is the cause for your chest pain, you may be told to keep your head raised (elevated) while sleeping. This reduces the chance that acid will go from your stomach into your  esophagus.  Make lifestyle changes as directed by your health care provider. These may include:  Getting regular exercise. Ask your health care provider to suggest some activities that are safe for you.  Eating a heart-healthy diet. A registered dietitian can help you to learn healthy eating options.  Maintaining a healthy weight.  Managing diabetes, if necessary.  Reducing stress. SEEK MEDICAL CARE IF:  Your chest pain does not go away after treatment.  You have a rash with blisters on your chest.  You have a fever. SEEK IMMEDIATE MEDICAL CARE IF:   Your chest pain is worse.  You have an increasing cough, or you cough up blood.  You have severe abdominal pain.  You have severe weakness.  You faint.  You have chills.  You have sudden, unexplained chest discomfort.  You have  sudden, unexplained discomfort in your arms, back, neck, or jaw.  You have shortness of breath at any time.  You suddenly start to sweat, or your skin gets clammy.  You feel nauseous or you vomit.  You suddenly feel light-headed or dizzy.  Your heart begins to beat quickly, or it feels like it is skipping beats. These symptoms may represent a serious problem that is an emergency. Do not wait to see if the symptoms will go away. Get medical help right away. Call your local emergency services (911 in the [place] Do not drive yourself to the hospital.   This information is not intended to replace advice given to you by your health care provider. Make sure you discuss any questions you have with your health care provider.   Document Released: 06/13/2005 Document Revised: 09/24/2014 Document Reviewed: 04/09/2014 Elsevier Interactive Patient Education 2016 [employer].   Emergency Department Resource Guide 1) Find a Doctor and Pay Out of Pocket Although you won't have to find out who is covered by your insurance plan, it is a good idea to ask around and get recommendations. You will then need to  call the office and see if the doctor you have chosen will accept you as a new patient and what types of options they offer for patients who are self-pay. Some doctors offer discounts or will set up payment plans for their patients who do not have insurance, but you will need to ask so you aren't surprised when you get to your appointment.  2) Contact Your Local Health Department Not all health departments have doctors that can see patients for sick visits, but many do, so it is worth a call to see if yours does. If you don't know where your local health department is, you can check in your phone book. The CDC also has a tool to help you locate your state's health department, and many state websites also have listings of all of their local health departments.  3) Find a Walk-in Clinic If your illness is not likely to be very severe or complicated, you may want to try a walk in clinic. These are popping up all over the country in pharmacies, drugstores, and shopping centers. They're usually staffed by nurse practitioners or physician assistants that have been trained to treat common illnesses and complaints. They're usually fairly quick and inexpensive. However, if you have serious medical issues or chronic medical problems, these are probably not your best option.  No Primary Care Doctor: - Call Health Connect at  [phone] - they can help you locate a primary care doctor that  accepts your insurance, provides certain services, etc. - Physician Referral Service- [phone]  Chronic Pain Problems: Organization         Address  Phone   Notes  [facility]  [phone] Patients need to be referred by their primary care doctor.   Medication Assistance: Organization         Address  Phone   Notes  [facility] Medication [facility] [address] [place], [place] [address] [phone] --Must be a resident of [facility] -- Must have NO insurance  coverage whatsoever (no Medicaid/ Medicare, etc.) -- The pt. MUST have a primary care doctor that directs their care regularly and follows them in the community   [facility]ist  [phone]   [employer]  [phone]    Agencies that provide inexpensive medical care: Organization  Address  Phone   Notes  [facility]  [phone]   [facility]    [phone]   [facility] [address] [place], [place] [address] [phone]   [facility] [address] [place] [phone]   Planned Parenthood    [phone]   [facility]    [phone]   [facility]  [address], [place] Phone:  [phone], Fax:  [phone] Hours of Operation:  9 am - 6 pm, M-F.  Also accepts Medicaid/Medicare and self-pay.  [facility]  [address], [place] Phone: [phone], Fax: [phone]. Hours of Operation:  8:30 am - 5:30 pm, M-F.  Also accepts Medicaid and self-pay.  [facility] [place] [address], [place] Phone: [phone]   [facility] [address] Natasha Bence [place], [place] [phone], Ext. [phone] Mondays & Thursdays: 7-9 AM.  First 15 patients are seen on a first come, first serve basis.    Medicaid-accepting [facility] Providers:  Organization         Address  Phone   Notes  [facility] [address], [place] [phone] Also accepts self-pay patients.  [facility] [address] Laurell Josephs [place], [place]  [phone]   [facility] [address], [place] [phone]   [facility] [address] [place] [phone]   Renaye Rakers [address], [place]   [phone] Only accepts [place] Access [place] patients after they have their  name applied to their card.   Self-Pay (no insurance) in [facility]:  Organization         Address  Phone   Notes  Sickle Cell Patients, [facility] [address] [place], [place] [phone]   [facility] [address] [place], [place] [phone]   [facility]  [address], [place] [phone]   [facility]/Dr. Osei-Bonsu  [address], [place] or [address], [place] [phone] Phone number for both [place] and [place] locations is the same.  [facility] [address] [place] [phone]   [facility] [address] [place] or [address] [phone]   [facility] [address] [place] [phone], phone; [phone], fax Sees patients 1st and 3rd Saturday of every month.  Must not qualify for public or private insurance (i.e. Medicaid, Medicare, [place] Health Choice, Veterans' Benefits)  Household income should be no more than 200% of the poverty level The clinic cannot treat you if you are pregnant or think you are pregnant  Sexually transmitted diseases are not treated at the clinic.    Dental Care: Organization         Address  Phone  Notes  [facility] [address] [place], [place] [phone] Accepts children up to age 49 who are enrolled in [place] or [place] Health Choice; pregnant women with a Medicaid card; and children who have applied for Medicaid or  Health Choice, but were declined, whose parents can pay a reduced fee at time of  service.  [facility] [place]  [address], [place] [phone] Accepts children up to age 421 who are enrolled in [place] or [place] Health Choice; pregnant women with a Medicaid card; and children who have applied for  Medicaid or [place] Health Choice, but were declined, whose parents can pay a reduced fee at time of service.  [facility]  [address], [place] [phone] Patients are seen by appointment only. Walk-ins are not accepted. [facility] will see patients 33 years of age and older. Monday - Tuesday (8am-5pm) Most Wednesdays (8:30-5pm) $30 per visit, cash only  [facility]  [address], [facility] [phone] Patients are seen by appointment only. Walk-ins are not accepted. [facility] will see patients 918 years of age and older. One Wednesday Evening (Monthly: Volunteer Based).  $30 per visit, cash only  [facility]  [phone] for adults; Children under age 24, call Graduate Pediatric Dentistry at [phone]) [phone]. Children aged 294-14, please call [phone] to request a pediatric application.  Dental services are provided in all areas of dental care including fillings, crowns and bridges, complete and partial dentures, implants, gum treatment, root canals, and extractions. Preventive care is also provided. Treatment is provided to both adults and children. Patients are selected via a lottery and there is often a waiting list.   [facility] [address], [place]  [phone] [email or web address]   [facility] [address], [place], [place] [phone], Ext. [phone] Second and Fourth Thursday of each month, opens at 6:30 AM; Clinic ends at 9 AM.  Patients are seen on a first-come first-served basis, and a limited number are seen during each clinic.   [facility]  [address], [place], [place] [phone]   Eligibility Requirements You must have lived in [place], [place], or [place] for at least the last three months.   You cannot be eligible for state or federal sponsored [place]healthcare insurance, including CIGNAVeterans Administration, [place]icaid,  or [employer]icare.   You generally cannot be eligible for healthcare insurance through your employer.    How to apply: Eligibility screenings are held every Tuesday and Wednesday afternoon from 1:00 pm until 4:00 pm. You do not need an appointment for the interview!  [facility] [address], [place], [place] [phone]   [facility]  [phone]   [facility]  [phone]   [facility]  [phone]    Behavioral Health Resources in the Community: Intensive Outpatient Programs Organization         Address  Phone  Notes  [facility] [address], [place], [place] [phone]   [facility] [address], [place], [place] [phone]   [facility] [address], [place], [place]  [phone]   [facility] [address],  [place], [place] [phone] or [phone]   Substance Abuse Resources Organization         Address  Phone  Notes  [facility]  [phone]   [facility]  [phone]   [facility]  [phone]   [facility]  [phone]   Residential & Outpatient Substance Abuse Program  [phone]   Psychological Services Organization         Address  Phone  Notes  [facility]  [phone]   [facility]  [phone]   [facility] [address] [place] [phone] or [phone]    Mobile Crisis Teams Organization         Address  Phone  Notes  [facility], Mobile Crisis Care Unit  [phone]   [facility]  [address] [place], [place] [phone]   Doristine Locks [address] [place] [phone]    Self-Help/Support Groups Organization         Address  Phone             Notes  [facility] [place] - variety of support groups   [phone]- [phone] Call for more information  Narcotics Anonymous (NA), [facility] [address], [employer] [place]  2 meetings at this location   Statistician         Address  Phone  Notes  [facility] [address],    [place]  [phone]   [facility]  [address] [place] [address], [place], [place] [phone]   [facility] [address] [place], [place] [phone] Admissions: 8am-3pm M-F  [facility] [address],    [place], [place] [phone]   [facility] [address] [place], [place], [place] [phone]   [facility] [address]  [place], [place] [phone]   [facility] - Intensive Outpatient [address] Laurell Josephs [address], [place], [place] [phone]   [facility] ([facility] [address]  [place], [place] [phone] or [phone]   [facility] ([facility]) [address], [place], [place] [phone] Accepts Medicaid  [facility] [address],  [place] [phone] Substance Abuse/Addiction Treatment   [facility]         Address  Phone  Notes  [facility]  [phone]   Angie Fava, PhD [address] [place]vin Knack [place], [place]   [phone] or [phone]   [facility]   [address] [place], [place] [phone]   [facility] [address] [place], [place] [phone] Insurance/Medicaid/sponsorship through [facility] and Families [address]                                    [place], [place] [phone] Therapy/tele-psych/case  [facility] [address], [place] [phone]    Dr. Lolly Mustache  [phone]   [facility]  [employer] [facility] Dept. 1) [address], [place] 2) [address] [place] 3)  [address], [place] [phone]  [phone]   [facility] Child Abuse Hotline [phone] or [phone] (After Hours)

## 2015-07-11 NOTE — ED Notes (Signed)
Patient left at this time with all belongings. 

## 2015-07-14 ENCOUNTER — Telehealth: Payer: Self-pay | Admitting: Surgery

## 2015-07-14 ENCOUNTER — Telehealth: Payer: Self-pay | Admitting: *Deleted

## 2015-07-14 NOTE — Telephone Encounter (Signed)
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 Discussed the [facility] and the guidelines including $3 per prescription co-pay, and one time per calendar year patient agreeable. ED CM enrolled patient, printed MATCH letter and given to patient with information of the participating pharmacies. ED CM also discussed the importance and benefits of primary care follow up, Patient verbalized appreciation for the assistance. Offered to assist with scheduling follow-up appt.Patient amendable. ED follow up appt scheduled appt.11/7 at 9:30 SCC. Stressed the importance of keeping this appt to establish care, patient verbalized understanding teach back done. No further ED CM needs identified

## 2015-07-14 NOTE — Telephone Encounter (Signed)
Pt significant other Marcelle Smiling(Natasha) called to inquire about MATCH letter.  Marcelle Smilingatasha states she was told that [facility], but when she arrived at pharmacy, [facility].  NCM researched PDMI system to find that [facility]  [facility] [address]

## 2015-07-15 ENCOUNTER — Emergency Department (HOSPITAL_COMMUNITY)
Admission: EM | Admit: 2015-07-15 | Discharge: 2015-07-15 | Disposition: A | Discharge status: Home / Self Care | Payer: Medicaid Other | Attending: Physician Assistant | Admitting: Physician Assistant

## 2015-07-15 ENCOUNTER — Encounter (HOSPITAL_COMMUNITY): Payer: Self-pay | Admitting: Emergency Medicine

## 2015-07-15 DIAGNOSIS — R569 Unspecified convulsions: Secondary | ICD-10-CM | POA: Diagnosis present

## 2015-07-15 DIAGNOSIS — Z720 Tobacco use: Secondary | ICD-10-CM | POA: Insufficient documentation

## 2015-07-15 DIAGNOSIS — Z79899 Other long term (current) drug therapy: Secondary | ICD-10-CM | POA: Diagnosis not present

## 2015-07-15 DIAGNOSIS — G40909 Epilepsy, unspecified, not intractable, without status epilepticus: Secondary | ICD-10-CM | POA: Insufficient documentation

## 2015-07-15 DIAGNOSIS — R079 Chest pain, unspecified: Secondary | ICD-10-CM | POA: Diagnosis not present

## 2015-07-15 LAB — CBC WITH DIFFERENTIAL/PLATELET
BASOS ABS: 0 10*3/uL (ref 0.0–0.1)
BASOS PCT: 1 %
Eosinophils Absolute: 0.2 10*3/uL (ref 0.0–0.7)
Eosinophils Relative: 8 %
HEMATOCRIT: 40 % (ref 39.0–52.0)
Hemoglobin: 13.8 g/dL (ref 13.0–17.0)
LYMPHS PCT: 40 %
Lymphs Abs: 1.2 10*3/uL (ref 0.7–4.0)
MCH: 29.1 pg (ref 26.0–34.0)
MCHC: 34.5 g/dL (ref 30.0–36.0)
MCV: 84.4 fL (ref 78.0–100.0)
Monocytes Absolute: 0.3 10*3/uL (ref 0.1–1.0)
Monocytes Relative: 11 %
NEUTROS ABS: 1.1 10*3/uL — AB (ref 1.7–7.7)
Neutrophils Relative %: 40 %
PLATELETS: 258 10*3/uL (ref 150–400)
RBC: 4.74 MIL/uL (ref 4.22–5.81)
RDW: 12 % (ref 11.5–15.5)
WBC: 2.8 10*3/uL — AB (ref 4.0–10.5)

## 2015-07-15 LAB — COMPREHENSIVE METABOLIC PANEL
ALBUMIN: 4.7 g/dL (ref 3.5–5.0)
ALT: 12 U/L — AB (ref 17–63)
AST: 18 U/L (ref 15–41)
Alkaline Phosphatase: 53 U/L (ref 38–126)
Anion gap: 6 (ref 5–15)
BILIRUBIN TOTAL: 0.6 mg/dL (ref 0.3–1.2)
BUN: 10 mg/dL (ref 6–20)
CHLORIDE: 106 mmol/L (ref 101–111)
CO2: 29 mmol/L (ref 22–32)
CREATININE: 1.06 mg/dL (ref 0.61–1.24)
Calcium: 9.5 mg/dL (ref 8.9–10.3)
GFR calc Af Amer: >60 mL/min (ref >60)
GLUCOSE: 90 mg/dL (ref 65–99)
POTASSIUM: 4.1 mmol/L (ref 3.5–5.1)
Sodium: 141 mmol/L (ref 135–145)
Total Protein: 7.7 g/dL (ref 6.5–8.1)

## 2015-07-15 MED ORDER — SODIUM CHLORIDE 0.9 % IV SOLN
1000.0000 mg | Freq: Once | INTRAVENOUS | Status: AC
  Administered 2015-07-15: 1000 mg via INTRAVENOUS
  Filled 2015-07-15: qty 10

## 2015-07-15 NOTE — Discharge Instructions (Signed)
Please follow up with neurology. You need to take your Keppra as prescribed.   Seizure, Adult A seizure is abnormal electrical activity in the brain. Seizures usually last from 30 seconds to 2 minutes. There are various types of seizures. Before a seizure, you may have a warning sensation (aura) that a seizure is about to occur. An aura may include the following symptoms:   Fear or anxiety.  Nausea.  Feeling like the room is spinning (vertigo).  Vision changes, such as seeing flashing lights or spots. Common symptoms during a seizure include:  A change in attention or behavior (altered mental status).  Convulsions with rhythmic jerking movements.  Drooling.  Rapid eye movements.  Grunting.  Loss of bladder and bowel control.  Bitter taste in the mouth.  Tongue biting. After a seizure, you may feel confused and sleepy. You may also have an injury resulting from convulsions during the seizure. HOME CARE INSTRUCTIONS   If you are given medicines, take them exactly as prescribed by your health care provider.  Keep all follow-up appointments as directed by your health care provider.  Do not swim or drive or engage in risky activity during which a seizure could cause further injury to you or others until your health care provider says it is OK.  Get adequate rest.  Teach friends and family what to do if you have a seizure. They should:  Lay you on the ground to prevent a fall.  Put a cushion under your head.  Loosen any tight clothing around your neck.  Turn you on your side. If vomiting occurs, this helps keep your airway clear.  Stay with you until you recover.  Know whether or not you need emergency care. SEEK IMMEDIATE MEDICAL CARE IF:  The seizure lasts longer than 5 minutes.  The seizure is severe or you do not wake up immediately after the seizure.  You have an altered mental status after the seizure.  You are having more frequent or worsening  seizures. Someone should drive you to the emergency department or call local emergency services (911 in [place] MAKE SURE YOU:  Understand these instructions.  Will watch your condition.  Will get help right away if you are not doing well or get worse.   This information is not intended to replace advice given to you by your health care provider. Make sure you discuss any questions you have with your health care provider.   Document Released: 08/31/2000 Document Revised: 09/24/2014 Document Reviewed: 04/15/2013 Elsevier Interactive Patient Education [employer]2016 Elsevier Inc.

## 2015-07-15 NOTE — ED Notes (Signed)
Per EMS pt had two witnessed seizures at home tonight  Pt finacee witnessed them  EMS states pt has not had any seizure activity with them  EMS states upon arrival pt was postictal and responded to sternal rub  Oxygen was applied via NRBM and pt aroused  Pt was not incontinent of urine, no tongue trama noted  CBG 113  Pt's only complaint is of headache but states he has had that all week  Pt recently had a change in his seizure medication and was started on potassium yesterday  Pt is alert and oriented upon arrival

## 2015-07-15 NOTE — ED Provider Notes (Signed)
CSN: [id number]     Arrival date & time 07/15/15  [id number] History   First MD Initiated Contact with Patient 07/15/15 0526     Chief Complaint  Patient presents with  . Seizures     (Consider location/radiation/quality/duration/timing/severity/associated sxs/prior Treatment) HPI   Patient is a 34 year old male with history of seizure presenting to the ED with seizure. Patient has been intermittently taking his Keppra. Patient had witnessed seizure at home by girlfriend. He sleeps on the floor so he did not hit his head. No injuries noted.  Patient notes mild headache and chest pain on the left part of his chest after the seizure.  Past Medical History  Diagnosis Date  . Seizures [facility](HCC)    Past Surgical History  Procedure Laterality Date  . Vasectomy Bilateral   . Hand surgery     History reviewed. No pertinent family history. Social History  Substance Use Topics  . Smoking status: Light Tobacco Smoker -- 0.30 packs/day    Types: Cigarettes    Last Attempt to Quit: 07/18/2014  . Smokeless tobacco: None  . Alcohol Use: 3.0 oz/week    5 Cans of beer per week     Comment: socially    Review of Systems  Constitutional: Negative for fever and activity change.  HENT: Negative for congestion and drooling.   Eyes: Negative for discharge and redness.  Respiratory: Negative for cough and shortness of breath.   Cardiovascular: Positive for chest pain.  Gastrointestinal: Negative for abdominal pain.  Genitourinary: Negative for dysuria.  Musculoskeletal: Negative for arthralgias.  Allergic/Immunologic: Negative for immunocompromised state.  Neurological: Positive for seizures. Negative for speech difficulty.  Psychiatric/Behavioral: Negative for behavioral problems and agitation.  All other systems reviewed and are negative.     Allergies  Review of patient's allergies indicates no known allergies.  Home Medications   Prior to Admission medications   Medication Sig Start  Date End Date Taking? Authorizing Provider  levETIRAcetam (KEPPRA) 1000 MG tablet Take 1 tablet (1,000 mg total) by mouth 2 (two) times daily. 07/10/15  Yes Mady GemmaElizabeth C Westfall, PA-C  potassium chloride SA (K-DUR,KLOR-CON) 20 MEQ tablet Take 20 mEq by mouth daily.   Yes Historical Provider, MD  penicillin v potassium (VEETID) 500 MG tablet Take 1 tablet (500 mg total) by mouth 4 (four) times daily. Patient not taking: Reported on 07/10/2015 05/11/15   Trixie DredgeEmily West, PA-C   BP 137/91 mmHg  Pulse 52  Temp(Src) 97.7 F (36.5 C) (Oral)  Resp 14  Ht 6\' 2"  (1.88 m)  Wt 165 lb (74.844 kg)  BMI 21.18 kg/m2  SpO2 100% Physical Exam  Constitutional: He is oriented to person, place, and time. He appears well-nourished.  HENT:  Head: Normocephalic.  Mouth/Throat: Oropharynx is clear and moist.  Eyes: Conjunctivae are normal.  Neck: No tracheal deviation present.  Cardiovascular: Normal rate.   Pulmonary/Chest: Effort normal. No stridor. No respiratory distress.  Abdominal: Soft. There is no tenderness. There is no guarding.  Musculoskeletal: Normal range of motion. He exhibits no edema.  Neurological: He is oriented to person, place, and time. No cranial nerve deficit.  Skin: Skin is warm and dry. No rash noted. He is not diaphoretic.  Psychiatric: He has a normal mood and affect. His behavior is normal.  Nursing note and vitals reviewed.   ED Course  Procedures (including critical care time) Labs Review Labs Reviewed  CBC WITH DIFFERENTIAL/PLATELET  COMPREHENSIVE METABOLIC PANEL  LEVETIRACETAM LEVEL    Imaging Review No results found.  I have personally reviewed and evaluated these images and lab results as part of my medical decision-making.   EKG Interpretation None      MDM   Final diagnoses:  None    Patient was seen very recently for exactly same complaint. He is here for seizure. Patient's been noncompliant with his Keppra. He's had seizures. It is possible that the  medication Keppra is not enough for him however I suspect that the seizures could be due to noncompliance. We will test his level today to better guide therapy.  The last time he was here, 5 days ago patient also complained of chest pain. Chest x-ray and troponin were negative. Patient complansof the same pain today. I think this probably most likely musculo skeletal due to the seizure. He has no physical signs of trauma therefore do not think that any imaging is necessary at this time.  We will again encourage patient to follow up with our neurology. Again encourage strict regimen of Keppra. We're testing his level today so that we can better guide therapy.   Kaden Dunkel Randall An, MD 07/15/15 [id number]

## 2015-07-18 ENCOUNTER — Emergency Department (HOSPITAL_COMMUNITY)
Admission: EM | Admit: 2015-07-18 | Discharge: 2015-07-18 | Discharge status: Against Medical Advice | Payer: Medicaid Other | Attending: Emergency Medicine | Admitting: Emergency Medicine

## 2015-07-18 ENCOUNTER — Encounter (HOSPITAL_COMMUNITY): Payer: Self-pay | Admitting: Emergency Medicine

## 2015-07-18 ENCOUNTER — Emergency Department (HOSPITAL_COMMUNITY): Payer: Medicaid Other

## 2015-07-18 DIAGNOSIS — Z720 Tobacco use: Secondary | ICD-10-CM | POA: Diagnosis not present

## 2015-07-18 DIAGNOSIS — R079 Chest pain, unspecified: Secondary | ICD-10-CM

## 2015-07-18 DIAGNOSIS — R0602 Shortness of breath: Secondary | ICD-10-CM | POA: Diagnosis not present

## 2015-07-18 DIAGNOSIS — R197 Diarrhea, unspecified: Secondary | ICD-10-CM | POA: Insufficient documentation

## 2015-07-18 DIAGNOSIS — R0789 Other chest pain: Secondary | ICD-10-CM | POA: Diagnosis not present

## 2015-07-18 DIAGNOSIS — Z79899 Other long term (current) drug therapy: Secondary | ICD-10-CM | POA: Insufficient documentation

## 2015-07-18 DIAGNOSIS — R1110 Vomiting, unspecified: Secondary | ICD-10-CM | POA: Insufficient documentation

## 2015-07-18 DIAGNOSIS — G40909 Epilepsy, unspecified, not intractable, without status epilepticus: Secondary | ICD-10-CM | POA: Diagnosis not present

## 2015-07-18 DIAGNOSIS — Z792 Long term (current) use of antibiotics: Secondary | ICD-10-CM | POA: Diagnosis not present

## 2015-07-18 DIAGNOSIS — R002 Palpitations: Secondary | ICD-10-CM | POA: Diagnosis not present

## 2015-07-18 LAB — I-STAT TROPONIN, ED: TROPONIN I, POC: 0 ng/mL (ref 0.00–0.08)

## 2015-07-18 LAB — CBC WITH DIFFERENTIAL/PLATELET
Basophils Absolute: 0 10*3/uL (ref 0.0–0.1)
Basophils Relative: 1 %
Eosinophils Absolute: 0.2 10*3/uL (ref 0.0–0.7)
Eosinophils Relative: 5 %
HCT: 37.5 % — ABNORMAL LOW (ref 39.0–52.0)
Hemoglobin: 12.8 g/dL — ABNORMAL LOW (ref 13.0–17.0)
Lymphocytes Relative: 33 %
Lymphs Abs: 1.1 10*3/uL (ref 0.7–4.0)
MCH: 28.3 pg (ref 26.0–34.0)
MCHC: 34.1 g/dL (ref 30.0–36.0)
MCV: 82.8 fL (ref 78.0–100.0)
Monocytes Absolute: 0.3 10*3/uL (ref 0.1–1.0)
Monocytes Relative: 10 %
Neutro Abs: 1.7 10*3/uL (ref 1.7–7.7)
Neutrophils Relative %: 51 %
Platelets: 223 10*3/uL (ref 150–400)
RBC: 4.53 MIL/uL (ref 4.22–5.81)
RDW: 11.8 % (ref 11.5–15.5)
WBC: 3.3 10*3/uL — ABNORMAL LOW (ref 4.0–10.5)

## 2015-07-18 LAB — BASIC METABOLIC PANEL
ANION GAP: 5 (ref 5–15)
BUN: 10 mg/dL (ref 6–20)
CALCIUM: 9.1 mg/dL (ref 8.9–10.3)
CO2: 28 mmol/L (ref 22–32)
CREATININE: 1 mg/dL (ref 0.61–1.24)
Chloride: 106 mmol/L (ref 101–111)
GFR calc non Af Amer: >60 mL/min (ref >60)
Glucose, Bld: 94 mg/dL (ref 65–99)
Potassium: 3.9 mmol/L (ref 3.5–5.1)
SODIUM: 139 mmol/L (ref 135–145)

## 2015-07-18 LAB — LEVETIRACETAM LEVEL: Levetiracetam Lvl: 21.9 ug/mL (ref 10.0–40.0)

## 2015-07-18 LAB — TROPONIN I: Troponin I: <0.03 ng/mL (ref <0.031)

## 2015-07-18 NOTE — ED Notes (Signed)
Per EMS pt was on his way to work this morning, reports he usually takes [place]epra for seizures 1000mg  twice a day, however today he took 2000 mg (so that he doesn't have to think about it later), and immediately afterwards had a sudden, sharp pain in his central chest. Per EMS pt reports similar thing happening last week.

## 2015-07-18 NOTE — ED Provider Notes (Signed)
CSN: [id number]     Arrival date & time 07/18/15  0719 History   First MD Initiated Contact with Patient 07/18/15 0802     Chief Complaint  Patient presents with  . Chest Pain   Keith Chung is a 34 y.o. male with a history of seizures on Keppra who presents to the emergency department complaining of chest pain starting around 6:30 this morning. Patient reports that he took 2000 mg of his Keppra today rather than his thousand milligram dose because he didn't want to take his dose tonight. He reports after taking the Keppra he immediately had sudden onset of left-sided chest pain that is nonradiating. He reports his chest pain is resolving as he has been in the emergency department currently as of 8 out of 10 pain. He reports his associated shortness of breath and palpitations. He reports he had some vomiting and diarrhea today. He vomited once after taking Keppra but denies vomiting up the Keppra. He reports 2 episodes of diarrhea today. He denies abdominal pain or nausea. He denies personal or close family history of MI. The patient reports he's been compliant with Keppra and has been taking it as prescribed. The patient denies fevers, abdominal pain, nausea, hematemesis, hematochezia, urinary symptoms, cough, wheezing, headache, double vision or seizure today.  (Consider location/radiation/quality/duration/timing/severity/associated sxs/prior Treatment) HPI  Past Medical History  Diagnosis Date  . Seizures [facility](HCC)    Past Surgical History  Procedure Laterality Date  . Vasectomy Bilateral   . Hand surgery     No family history on file. Social History  Substance Use Topics  . Smoking status: Light Tobacco Smoker -- 0.30 packs/day    Types: Cigarettes    Last Attempt to Quit: 07/18/2014  . Smokeless tobacco: None  . Alcohol Use: 3.0 oz/week    5 Cans of beer per week     Comment: socially    Review of Systems  Constitutional: Positive for chills. Negative for fever.  HENT:  Negative for congestion and sore throat.   Eyes: Negative for visual disturbance.  Respiratory: Positive for shortness of breath. Negative for cough and wheezing.   Cardiovascular: Positive for chest pain and palpitations. Negative for leg swelling.  Gastrointestinal: Positive for vomiting and diarrhea. Negative for nausea, abdominal pain and blood in stool.  Genitourinary: Negative for dysuria and difficulty urinating.  Musculoskeletal: Negative for back pain and neck pain.  Skin: Negative for rash.  Neurological: Negative for seizures, syncope, weakness, light-headedness, numbness and headaches.      Allergies  Pollen extract  Home Medications   Prior to Admission medications   Medication Sig Start Date End Date Taking? Authorizing Provider  levETIRAcetam (KEPPRA) 1000 MG tablet Take 1 tablet (1,000 mg total) by mouth 2 (two) times daily. 07/10/15  Yes Mady GemmaElizabeth C Westfall, PA-C  penicillin v potassium (VEETID) 500 MG tablet Take 1 tablet (500 mg total) by mouth 4 (four) times daily. 05/11/15  Yes Trixie DredgeEmily West, PA-C  potassium chloride SA (K-DUR,KLOR-CON) 20 MEQ tablet Take 20 mEq by mouth daily.   Yes Historical Provider, MD   BP 135/87 mmHg  Pulse 64  Temp(Src) 98 F (36.7 C) (Oral)  Resp 17  SpO2 100% Physical Exam  Constitutional: He is oriented to person, place, and time. He appears well-developed and well-nourished. No distress.  Nontoxic appearing.  HENT:  Head: Normocephalic and atraumatic.  Mouth/Throat: Oropharynx is clear and moist.  Eyes: Conjunctivae are normal. Pupils are equal, round, and reactive to light. Right eye exhibits  no discharge. Left eye exhibits no discharge.  Neck: Normal range of motion. Neck supple. No JVD present. No tracheal deviation present.  Cardiovascular: Normal rate, regular rhythm, normal heart sounds and intact distal pulses.  Exam reveals no gallop and no friction rub.   No murmur heard. Bilateral radial and posterior tibialis pulses are  intact.  Pulmonary/Chest: Effort normal and breath sounds normal. No respiratory distress. He has no wheezes. He has no rales. He exhibits tenderness.  Lungs are clear to auscultation bilaterally. Left chest wall is tender to palpation and reproduces his chest pain.  Abdominal: Soft. There is no tenderness. There is no guarding.  Musculoskeletal: He exhibits no edema or tenderness.  No lower extremity edema or tenderness.  Lymphadenopathy:    He has no cervical adenopathy.  Neurological: He is alert and oriented to person, place, and time. Coordination normal.  Skin: Skin is warm and dry. No rash noted. He is not diaphoretic. No erythema. No pallor.  Psychiatric: He has a normal mood and affect. His behavior is normal.  Nursing note and vitals reviewed.   ED Course  Procedures (including critical care time) Labs Review Labs Reviewed  CBC WITH DIFFERENTIAL/PLATELET - Abnormal; Notable for the following:    WBC 3.3 (*)    Hemoglobin 12.8 (*)    HCT 37.5 (*)    All other components within normal limits  BASIC METABOLIC PANEL  TROPONIN I  I-STAT TROPOININ, ED    Imaging Review Dg Chest 2 View  07/18/2015  CLINICAL DATA:  Mid chest pain, onset this morning, possible medication related. EXAM: CHEST  2 VIEW COMPARISON:  Portable chest x-ray dated July 10, 2015 FINDINGS: The lungs are well-expanded and clear. There is no pneumothorax, pneumomediastinum, or pleural effusion. The heart and pulmonary vascularity are normal. The retrosternal soft tissues are normal. The bony thorax is unremarkable. IMPRESSION: There is no active cardiopulmonary disease. Electronically Signed   By: David  [place] M.D.   On: 07/18/2015 09:12   I have personally reviewed and evaluated these images and lab results as part of my medical decision-making.   EKG Interpretation   Date/Time:  Monday July 18 2015 08:46:29 EDT Ventricular Rate:  59 PR Interval:  163 QRS Duration: 106 QT Interval:  401 QTC  Calculation: 397 R Axis:   89 Text Interpretation:  Sinus rhythm RSR' in V1 or V2, probably normal  variant ST elevation suggests acute pericarditis no significant change  since Jul 15 2015 Confirmed by Criss Alvine  MD, SCOTT [phone]) on 07/18/2015  8:48:59 AM      Filed Vitals:   07/18/15 1000 07/18/15 1015 07/18/15 1018 07/18/15 1100  BP:   144/89 135/87  Pulse: 62 78 67 64  Temp:      TempSrc:      Resp: SpO2: 100% 100% 100% 100%     MDM   Final diagnoses:  Chest pain, unspecified chest pain type   This is a 34 y.o. male with a history of seizures on Keppra who presents to the emergency department complaining of chest pain starting around 6:30 this morning. Patient reports that he took 2000 mg of his Keppra today rather than his thousand milligram dose because he didn't want to take his dose tonight. He reports after taking the Keppra he immediately had sudden onset of left-sided chest pain that is nonradiating. He reports his chest pain is resolving as he has been in the emergency department currently as of 8 out of  10 pain. He reports his associated shortness of breath and palpitations. He reports he had some vomiting and diarrhea today. He vomited once after taking Keppra but denies vomiting up the Keppra. He reports 2 episodes of diarrhea today. He denies abdominal pain or nausea. I advised the patient that he cannot take twice his dose to make up for not taking it twice a day. I explained he must take his medication thousand milligrams twice a day once in the morning and once in the evening. Patient's chest x-ray is unremarkable. CBC is unremarkable. Troponin is negative. BMP is within normal limits. EKG shows no significant changes. I advised patient to plan to check a delta troponin.  Later was performed the patient wanted to leave AMA. I went to bedside and discussed this with the patient. Patient reports he is chest pain-free and does not want to stay for a delta  troponin. I splinted him the importance of this patient still discussed that he wanted to sign out AMA. I advised strict return precautions and advised to be signing out against my advice. He states that he understands and I have encouraged him to follow-up with his neurologist and primary care provider.     Everlene Farrier, PA-C 07/18/15 1658  Pricilla Loveless, MD 07/21/15 [phone]

## 2015-07-18 NOTE — ED Notes (Addendum)
EMS gave 324 mg aspirin en route

## 2015-07-18 NOTE — ED Notes (Signed)
PA at bedside.

## 2015-07-18 NOTE — ED Notes (Signed)
Bed: [id number] Expected date: 07/18/15 Expected time: 7:03 AM Means of arrival: Ambulance Comments: 34 yo M  Chest pain

## 2015-07-18 NOTE — ED Notes (Signed)
Patient transported to X-ray 

## 2015-07-25 ENCOUNTER — Ambulatory Visit: Payer: BLUE CROSS/BLUE SHIELD | Admitting: Family Medicine

## 2015-08-02 ENCOUNTER — Emergency Department
Admission: EM | Admit: 2015-08-02 | Discharge: 2015-08-02 | Disposition: A | Discharge status: Home / Self Care | Payer: Medicaid Other | Attending: Emergency Medicine | Admitting: Emergency Medicine

## 2015-08-02 ENCOUNTER — Emergency Department: Payer: Medicaid Other

## 2015-08-02 ENCOUNTER — Encounter: Payer: Self-pay | Admitting: *Deleted

## 2015-08-02 DIAGNOSIS — Z79899 Other long term (current) drug therapy: Secondary | ICD-10-CM | POA: Insufficient documentation

## 2015-08-02 DIAGNOSIS — Y9289 Other specified places as the place of occurrence of the external cause: Secondary | ICD-10-CM | POA: Diagnosis not present

## 2015-08-02 DIAGNOSIS — Y998 Other external cause status: Secondary | ICD-10-CM | POA: Diagnosis not present

## 2015-08-02 DIAGNOSIS — Y9389 Activity, other specified: Secondary | ICD-10-CM | POA: Insufficient documentation

## 2015-08-02 DIAGNOSIS — S0181XA Laceration without foreign body of other part of head, initial encounter: Secondary | ICD-10-CM

## 2015-08-02 DIAGNOSIS — G40909 Epilepsy, unspecified, not intractable, without status epilepticus: Secondary | ICD-10-CM | POA: Insufficient documentation

## 2015-08-02 DIAGNOSIS — F17200 Nicotine dependence, unspecified, uncomplicated: Secondary | ICD-10-CM | POA: Diagnosis not present

## 2015-08-02 DIAGNOSIS — W19XXXA Unspecified fall, initial encounter: Secondary | ICD-10-CM | POA: Diagnosis not present

## 2015-08-02 DIAGNOSIS — S0990XA Unspecified injury of head, initial encounter: Secondary | ICD-10-CM | POA: Diagnosis not present

## 2015-08-02 DIAGNOSIS — Z792 Long term (current) use of antibiotics: Secondary | ICD-10-CM | POA: Diagnosis not present

## 2015-08-02 DIAGNOSIS — T07XXXA Unspecified multiple injuries, initial encounter: Secondary | ICD-10-CM

## 2015-08-02 DIAGNOSIS — R569 Unspecified convulsions: Secondary | ICD-10-CM

## 2015-08-02 LAB — COMPREHENSIVE METABOLIC PANEL
ALBUMIN: 4.7 g/dL (ref 3.5–5.0)
ALT: 14 U/L — AB (ref 17–63)
AST: 24 U/L (ref 15–41)
Alkaline Phosphatase: 58 U/L (ref 38–126)
Anion gap: 10 (ref 5–15)
BUN: 10 mg/dL (ref 6–20)
CHLORIDE: 107 mmol/L (ref 101–111)
CO2: 23 mmol/L (ref 22–32)
CREATININE: 1.1 mg/dL (ref 0.61–1.24)
Calcium: 9.2 mg/dL (ref 8.9–10.3)
GFR calc Af Amer: >60 mL/min (ref >60)
GFR calc non Af Amer: >60 mL/min (ref >60)
GLUCOSE: 100 mg/dL — AB (ref 65–99)
POTASSIUM: 4.5 mmol/L (ref 3.5–5.1)
Sodium: 140 mmol/L (ref 135–145)
Total Bilirubin: 0.6 mg/dL (ref 0.3–1.2)
Total Protein: 7.6 g/dL (ref 6.5–8.1)

## 2015-08-02 LAB — URINALYSIS COMPLETE WITH MICROSCOPIC (ARMC ONLY)
BACTERIA UA: NONE SEEN
Bilirubin Urine: NEGATIVE
Glucose, UA: NEGATIVE mg/dL
Hgb urine dipstick: NEGATIVE
KETONES UR: NEGATIVE mg/dL
Leukocytes, UA: NEGATIVE
Nitrite: NEGATIVE
PH: 7 (ref 5.0–8.0)
PROTEIN: 30 mg/dL — AB
Specific Gravity, Urine: 1.021 (ref 1.005–1.030)

## 2015-08-02 LAB — CBC
HCT: 41.2 % (ref 40.0–52.0)
Hemoglobin: 13.7 g/dL (ref 13.0–18.0)
MCH: 28.1 pg (ref 26.0–34.0)
MCHC: 33.3 g/dL (ref 32.0–36.0)
MCV: 84.3 fL (ref 80.0–100.0)
PLATELETS: 284 10*3/uL (ref 150–440)
RBC: 4.89 MIL/uL (ref 4.40–5.90)
RDW: 12.4 % (ref 11.5–14.5)
WBC: 6.3 10*3/uL (ref 3.8–10.6)

## 2015-08-02 MED ORDER — HYDROCODONE-ACETAMINOPHEN 5-325 MG PO TABS: 1.0000 | ORAL_TABLET | ORAL | Status: DC | PRN

## 2015-08-02 MED ORDER — LEVETIRACETAM 500 MG PO TABS
1000.0000 mg | ORAL_TABLET | Freq: Once | ORAL | Status: AC
  Administered 2015-08-02: 1000 mg via ORAL
  Filled 2015-08-02: qty 2

## 2015-08-02 MED ORDER — HYDROCODONE-ACETAMINOPHEN 5-325 MG PO TABS
1.0000 | ORAL_TABLET | Freq: Once | ORAL | Status: AC
  Administered 2015-08-02: 1 via ORAL
  Filled 2015-08-02: qty 1

## 2015-08-02 NOTE — ED Notes (Signed)
Patient transported to CT 

## 2015-08-02 NOTE — ED Provider Notes (Signed)
[facility] Emergency Department Provider Note  Time seen: 9:56 AM  I have reviewed the triage vital signs and the nursing notes.   HISTORY  Chief Complaint Seizures    HPI Idelle Crouchrnest C Kauk is a 34 y.o. male with a past medical history of epilepsy on Keppra, presents to the emergency department after a seizure.According to the patient he does not recall having the seizure, but awoke on the ground. Abrasions to his face, mild headache. Denies missing any doses of his Keppra. Denies any recent alcohol use. Denies drug use. Describes his headache as mild, facial pain as moderate.     Past Medical History  Diagnosis Date  . Seizures (HCC)     There are no active problems to display for this patient.   Past Surgical History  Procedure Laterality Date  . Vasectomy Bilateral   . Hand surgery      Current Outpatient Rx  Name  Route  Sig  Dispense  Refill  . levETIRAcetam (KEPPRA) 1000 MG tablet   Oral   Take 1 tablet (1,000 mg total) by mouth 2 (two) times daily.   60 tablet   0   . penicillin v potassium (VEETID) 500 MG tablet   Oral   Take 1 tablet (500 mg total) by mouth 4 (four) times daily.   40 tablet   0   . potassium chloride SA (K-DUR,KLOR-CON) 20 MEQ tablet   Oral   Take 20 mEq by mouth daily.           Allergies Pollen extract  No family history on file.  Social History Social History  Substance Use Topics  . Smoking status: Light Tobacco Smoker -- 0.30 packs/day    Types: Cigarettes    Last Attempt to Quit: 07/18/2014  . Smokeless tobacco: None  . Alcohol Use: 3.0 oz/week    5 Cans of beer per week     Comment: socially    Review of Systems Constitutional: Negative for fever. Cardiovascular: Negative for chest pain. Respiratory: Negative for shortness of breath. Gastrointestinal: Negative for abdominal pain Musculoskeletal: Negative for back pain. Skin: Abrasions to face. Neurological: Mild headache. Denies  focal weakness or numbness. 10-point ROS otherwise negative.  ____________________________________________   PHYSICAL EXAM:  VITAL SIGNS: ED Triage Vitals  Enc Vitals Group     BP 08/02/15 0947 127/77 mmHg     Pulse Rate 08/02/15 0947 83     Resp 08/02/15 0947 20     Temp 08/02/15 0947 97.2 F (36.2 C)     Temp Source 08/02/15 0947 Oral     SpO2 08/02/15 0947 97 %     Weight 08/02/15 0947 165 lb (74.844 kg)     Height 08/02/15 0947 6\' 2"  (1.88 m)     Head Cir --      Peak Flow --      Pain Score 08/02/15 0949 8     Pain Loc --      Pain Edu? --      Excl. in GC? --    Constitutional: Alert and oriented. Abrasions to face, but no distress. Denies any complaints besides mild headache and facial pain. Eyes: Normal exam, 2 mm PERRL, EOMI ENT   Head: Abrasions to left face.   Nose: No congestion/rhinnorhea. No hematoma.   Mouth/Throat: Mucous membranes are moist. No oral injuries identified. 1.5 cm laceration to left chin. Cardiovascular: Normal rate, regular rhythm. No murmur Respiratory: Normal respiratory effort without tachypnea nor retractions. Breath  sounds are clear Gastrointestinal: Soft and nontender. No distention.   Musculoskeletal: Nontender with normal range of motion in all extremities.  Neurologic:  Normal speech and language. No gross focal neurologic deficits Skin:  Skin is warm, dry and intact.  Psychiatric: Mood and affect are normal. Speech and behavior are normal.   ____________________________________________   RADIOLOGY  CT showed no acute abnormality  ____________________________________________    INITIAL IMPRESSION / ASSESSMENT AND PLAN / ED COURSE  Pertinent labs & imaging results that were available during my care of the patient were reviewed by me and considered in my medical decision making (see chart for details).  Patient with likely seizure. Currently awake, alert, and oriented. No complaints besides mild headache and  facial pain. Patient has moderate abrasions to the left face along with a 1.5 cm laceration to the left chin. Otherwise normal atraumatic exam. No C-spine tenderness or pain with range of motion. Cleared C-spine clinically. We will obtain imaging of the patient's head and face, repaired chin laceration with sutures, and closely monitor in the emergency department. I will dose 1000 mg of Keppra.  CT head/face are negative. Labs are largely within normal limits, urinalysis pending. Patient states he is feeling better still with facial pain. We'll dose Norco in the emergency department and likely discharge on a short course of the same. Patient is to use bacitracin to his abrasions. Absorbable sutures use for facial laceration, patient follow up with his primary care doctor.  Patient states his last tetanus shot was within one year.   LACERATION REPAIR Performed by: Minna Antis Authorized by: Minna Antis Consent: Verbal consent obtained. Risks and benefits: risks, benefits and alternatives were discussed Consent given by: patient Patient identity confirmed: provided demographic data Prepped and Draped in normal sterile fashion Wound explored  Laceration Location: Chin  Laceration Length: 1.5 cm  No Foreign Bodies seen or palpated  Anesthesia: local infiltration  Local anesthetic: lidocaine 1 % with epinephrine  Anesthetic total: 5 ml  Irrigation method: syringe Amount of cleaning: standard  Skin closure: Sutures, 5-0 rapid Vicryl   Number of sutures: 3   Technique: Simple interrupted   Patient tolerance: Patient tolerated the procedure well with no immediate complications.   ____________________________________________   FINAL CLINICAL IMPRESSION(S) / ED DIAGNOSES  Seizure Abrasions Laceration   Minna Antis, MD 08/02/15 1309

## 2015-08-02 NOTE — ED Notes (Signed)
Found in someones driveway, has left facial swelling, lac to chin

## 2015-08-02 NOTE — ED Notes (Signed)
Exam per md, pt removed from back board and ccollar, pt did not remember how he got to [place], pt states he lives in [place]

## 2015-08-02 NOTE — Discharge Instructions (Signed)
You have been seen in the emergency department after a likely seizure. He suffered multiple abrasions of the face as well as a small laceration of the chin. This was repaired using absorbable sutures should fall out in the next 3-5 days. Please use Neosporin or other antiseptic for your abrasions twice daily. Please take pain medication as needed, as prescribed. Please take your Keppra every day as prescribed. Please follow-up with neurology as soon as possible.   Epilepsy Epilepsy is a disorder in which a person has repeated seizures over time. A seizure is a release of abnormal electrical activity in the brain. Seizures can cause a change in attention, behavior, or the ability to remain awake and alert (altered mental status). Seizures often involve uncontrollable shaking (convulsions).  Most people with epilepsy lead normal lives. However, people with epilepsy are at an increased risk of falls, accidents, and injuries. Therefore, it is important to begin treatment right away. CAUSES  Epilepsy has many possible causes. Anything that disturbs the normal pattern of brain cell activity can lead to seizures. This may include:   Head injury.  Birth trauma.  High fever as a child.  Stroke.  Bleeding into or around the brain.  Certain drugs.  Prolonged low oxygen, such as what occurs after CPR efforts.  Abnormal brain development.  Certain illnesses, such as meningitis, encephalitis (brain infection), malaria, and other infections.  An imbalance of nerve signaling chemicals (neurotransmitters).  SIGNS AND SYMPTOMS  The symptoms of a seizure can vary greatly from one person to another. Right before a seizure, you may have a warning (aura) that a seizure is about to occur. An aura may include the following symptoms:  Fear or anxiety.  Nausea.  Feeling like the room is spinning (vertigo).  Vision changes, such as seeing flashing lights or spots. Common symptoms during a seizure  include:  Abnormal sensations, such as an abnormal smell or a bitter taste in the mouth.   Sudden, general body stiffness.   Convulsions that involve rhythmic jerking of the face, arm, or leg on one or both sides.   Sudden change in consciousness.   Appearing to be awake but not responding.   Appearing to be asleep but cannot be awakened.   Grimacing, chewing, lip smacking, drooling, tongue biting, or loss of bowel or bladder control. After a seizure, you may feel sleepy for a while. DIAGNOSIS  Your health care provider will ask about your symptoms and take a medical history. Descriptions from any witnesses to your seizures will be very helpful in the diagnosis. A physical exam, including a detailed neurological exam, is necessary. Various tests may be done, such as:   An electroencephalogram (EEG). This is a painless test of your brain waves. In this test, a diagram is created of your brain waves. These diagrams can be interpreted by a specialist.  An MRI of the brain.   A CT scan of the brain.   A spinal tap (lumbar puncture, LP).  Blood tests to check for signs of infection or abnormal blood chemistry. TREATMENT  There is no cure for epilepsy, but it is generally treatable. Once epilepsy is diagnosed, it is important to begin treatment as soon as possible. For most people with epilepsy, seizures can be controlled with medicines. The following may also be used:  A pacemaker for the brain (vagus nerve stimulator) can be used for people with seizures that are not well controlled by medicine.  Surgery on the brain. For some people, epilepsy  eventually goes away. HOME CARE INSTRUCTIONS   Follow your health care provider's recommendations on driving and safety in normal activities.  Get enough rest. Lack of sleep can cause seizures.  Only take over-the-counter or prescription medicines as directed by your health care provider. Take any prescribed medicine exactly as  directed.  Avoid any known triggers of your seizures.  Keep a seizure diary. Record what you recall about any seizure, especially any possible trigger.   Make sure the people you live and work with know that you are prone to seizures. They should receive instructions on how to help you. In general, a witness to a seizure should:   Cushion your head and body.   Turn you on your side.   Avoid unnecessarily restraining you.   Not place anything inside your mouth.   Call for emergency medical help if there is any question about what has occurred.   Follow up with your health care provider as directed. You may need regular blood tests to monitor the levels of your medicine.  SEEK MEDICAL CARE IF:   You develop signs of infection or other illness. This might increase the risk of a seizure.   You seem to be having more frequent seizures.   Your seizure pattern is changing.  SEEK IMMEDIATE MEDICAL CARE IF:   You have a seizure that does not stop after a few moments.   You have a seizure that causes any difficulty in breathing.   You have a seizure that results in a very severe headache.   You have a seizure that leaves you with the inability to speak or use a part of your body.    This information is not intended to replace advice given to you by your health care provider. Make sure you discuss any questions you have with your health care provider.   Document Released: 09/03/2005 Document Revised: 06/24/2013 Document Reviewed: 04/15/2013 Elsevier Interactive Patient Education 2016 [employer]  Facial Laceration A facial laceration is a cut on the face. These injuries can be painful and cause bleeding. Some cuts may need to be closed with stitches (sutures), skin adhesive strips, or wound glue. Cuts usually heal quickly but can leave a scar. It can take 1-2 years for the scar to go away completely. HOME CARE   Only take medicines as told by your doctor.  Follow  your doctor's instructions for wound care. For Stitches:  Keep the cut clean and dry.  If you have a bandage (dressing), change it at least once a day. Change the bandage if it gets wet or dirty, or as told by your doctor.  Wash the cut with soap and water 2 times a day. Rinse the cut with water. Pat it dry with a clean towel.  Put a thin layer of medicated cream on the cut as told by your doctor.  You may shower after the first 24 hours. Do not soak the cut in water until the stitches are removed.  Have your stitches removed as told by your doctor.  Do not wear any makeup until a few days after your stitches are removed. For Skin Adhesive Strips:  Keep the cut clean and dry.  Do not get the strips wet. You may take a bath, but be careful to keep the cut dry.  If the cut gets wet, pat it dry with a clean towel.  The strips will fall off on their own. Do not remove the strips that are still stuck  to the cut. For Wound Glue:  You may shower or take baths. Do not soak or scrub the cut. Do not swim. Avoid heavy sweating until the glue falls off on its own. After a shower or bath, pat the cut dry with a clean towel.  Do not put medicine or makeup on your cut until the glue falls off.  If you have a bandage, do not put tape over the glue.  Avoid lots of sunlight or tanning lamps until the glue falls off.  The glue will fall off on its own in 5-10 days. Do not pick at the glue. After Healing:  Put sunscreen on the cut for the first year to reduce your scar. GET HELP IF:  You have a fever. GET HELP RIGHT AWAY IF:   Your cut area gets red, painful, or puffy (swollen).  You see a yellowish-white fluid (pus) coming from the cut.   This information is not intended to replace advice given to you by your health care provider. Make sure you discuss any questions you have with your health care provider.   Document Released: 02/20/2008 Document Revised: 09/24/2014 Document Reviewed:  04/16/2013 Elsevier Interactive Patient Education [employer]

## 2015-08-29 ENCOUNTER — Emergency Department
Admission: EM | Admit: 2015-08-29 | Discharge: 2015-08-29 | Disposition: A | Discharge status: Home / Self Care | Payer: Medicaid Other | Attending: Emergency Medicine | Admitting: Emergency Medicine

## 2015-08-29 DIAGNOSIS — F17210 Nicotine dependence, cigarettes, uncomplicated: Secondary | ICD-10-CM | POA: Diagnosis not present

## 2015-08-29 DIAGNOSIS — R569 Unspecified convulsions: Secondary | ICD-10-CM

## 2015-08-29 DIAGNOSIS — Z792 Long term (current) use of antibiotics: Secondary | ICD-10-CM | POA: Insufficient documentation

## 2015-08-29 DIAGNOSIS — Z79899 Other long term (current) drug therapy: Secondary | ICD-10-CM | POA: Insufficient documentation

## 2015-08-29 MED ORDER — LEVETIRACETAM 500 MG PO TABS
1000.0000 mg | ORAL_TABLET | Freq: Once | ORAL | Status: AC
  Administered 2015-08-29: 1000 mg via ORAL
  Filled 2015-08-29: qty 2

## 2015-08-29 NOTE — Discharge Instructions (Signed)

## 2015-08-29 NOTE — ED Notes (Signed)
Pt arrives to ED via [facility] d/t reports of a witnessed seizure by the pt's mother. EMS reports that the pt denies having a seizure, but reports it was related to his "epilepsy", because his mother "keeps the house so dark". Pt is A&O, in NAD, with respirations even regular and unlabored.

## 2015-08-29 NOTE — ED Provider Notes (Signed)
[facility] Emergency Department Provider Note  ____________________________________________  Time seen: Approximately 6:11 AM  I have reviewed the triage vital signs and the nursing notes.   HISTORY  Chief Complaint Seizures    HPI Idelle Crouchrnest C Canepa is a 34 y.o. male with a history of seizure disorder who was brought in for a possible seizure. The patient reports that he was at home doing well and does not think that he had a seizure. The patient reports that he was asleep at home and his mom felt something was wrong. The patient reports that he got up to use the restroom and EMS was there. He reports that he takes his Keppra 2 times daily and he does not have seizures that often. He reports that he had been a while since he had his last seizure. The patient reports that typically when he has a seizure he had a bite his lip where he feels very fatigued and weighted down but he does not feel that way at this time. The patient reports the EMS checked him out initially but he does not feel like he needs to be checked out further. The patient denies any lack of sleep, cold symptoms, nausea, vomiting or diarrhea.   Past Medical History  Diagnosis Date  . Seizures (HCC)     There are no active problems to display for this patient.   Past Surgical History  Procedure Laterality Date  . Vasectomy Bilateral   . Hand surgery      Current Outpatient Rx  Name  Route  Sig  Dispense  Refill  . HYDROcodone-acetaminophen (NORCO/VICODIN) 5-325 MG tablet   Oral   Take 1 tablet by mouth every 4 (four) hours as needed for moderate pain.   12 tablet   0   . levETIRAcetam (KEPPRA) 1000 MG tablet   Oral   Take 1 tablet (1,000 mg total) by mouth 2 (two) times daily.   60 tablet   0   . penicillin v potassium (VEETID) 500 MG tablet   Oral   Take 1 tablet (500 mg total) by mouth 4 (four) times daily. Patient not taking: Reported on 08/02/2015   40 tablet   0   .  potassium chloride SA (K-DUR,KLOR-CON) 20 MEQ tablet   Oral   Take 20 mEq by mouth daily.           Allergies Pollen extract  No family history on file.  Social History Social History  Substance Use Topics  . Smoking status: Light Tobacco Smoker -- 0.30 packs/day    Types: Cigarettes    Last Attempt to Quit: 07/18/2014  . Smokeless tobacco: None  . Alcohol Use: 3.0 oz/week    5 Cans of beer per week     Comment: socially    Review of Systems Constitutional: No fever/chills Eyes: No visual changes. ENT: No sore throat. Cardiovascular: Denies chest pain. Respiratory: Denies shortness of breath. Gastrointestinal: No abdominal pain.  No nausea, no vomiting.  No diarrhea.  No constipation. Genitourinary: Negative for dysuria. Musculoskeletal: Negative for back pain. Skin: Negative for rash. Neurological: Seizure  10-point ROS otherwise negative.  ____________________________________________   PHYSICAL EXAM:  VITAL SIGNS: ED Triage Vitals  Enc Vitals Group     BP --      Pulse --      Resp --      Temp --      Temp src --      SpO2 --  Weight --      Height --      Head Cir --      Peak Flow --      Pain Score --      Pain Loc --      Pain Edu? --      Excl. in GC? --     Constitutional: Alert and oriented. Well appearing and in no acute distress. Eyes: Conjunctivae are normal. PERRL. EOMI. Head: Atraumatic. Nose: No congestion/rhinnorhea. Mouth/Throat: Mucous membranes are moist.  Oropharynx non-erythematous. Cardiovascular: Normal rate, regular rhythm. Grossly normal heart sounds.  Good peripheral circulation. Respiratory: Normal respiratory effort.  No retractions. Lungs CTAB. Gastrointestinal: Soft and nontender. No distention. Positive bowel sounds Musculoskeletal: No lower extremity tenderness nor edema.   Neurologic:  Normal speech and language. No gross focal neurologic deficits are appreciated.  Skin:  Skin is warm, dry and intact.   Psychiatric: Mood and affect are normal.   ____________________________________________   LABS (all labs ordered are listed, but only abnormal results are displayed)  Labs Reviewed - No data to display ____________________________________________  EKG  None ____________________________________________  RADIOLOGY  None ____________________________________________   PROCEDURES  Procedure(s) performed: None  Critical Care performed: No  ____________________________________________   INITIAL IMPRESSION / ASSESSMENT AND PLAN / ED COURSE  Pertinent labs & imaging results that were available during my care of the patient were reviewed by me and considered in my medical decision making (see chart for details).  This is a 34 year old male who comes in today with a possible seizure. The patient is very insistent that he did not have a seizure as he is not having the typical symptoms after a seizure. The patient does not feel that he needs to be evaluated at this time because again he doesn't feel like he has a seizure. I informed him that we are in a difficult position as he is in the hospital but I did offer to give him a dose of his Keppra here. The patient reports that he is willing to take the Keppra but he does not want any blood work done or any imaging done. I will have the patient follow-up with the urologist for further evaluation. The patient was seen here one month ago with seizures and was discharged without any difficulty. The patient is appropriate this time and his vital signs and neurologic exam are unremarkable. The patient will be discharged home. ____________________________________________   FINAL CLINICAL IMPRESSION(S) / ED DIAGNOSES  Final diagnoses:  Seizure (HCC)      Rebecka Apley, MD 08/29/15 [phone]

## 2015-08-31 ENCOUNTER — Ambulatory Visit: Payer: Self-pay | Admitting: Ophthalmology

## 2015-08-31 ENCOUNTER — Ambulatory Visit: Payer: Self-pay | Admitting: Internal Medicine

## 2015-09-08 ENCOUNTER — Ambulatory Visit: Payer: Self-pay

## 2015-09-15 ENCOUNTER — Ambulatory Visit: Payer: Self-pay

## 2015-09-23 ENCOUNTER — Encounter: Payer: Self-pay | Admitting: Emergency Medicine

## 2015-09-23 ENCOUNTER — Emergency Department: Payer: Medicaid Other

## 2015-09-23 ENCOUNTER — Emergency Department
Admission: EM | Admit: 2015-09-23 | Discharge: 2015-09-23 | Disposition: A | Discharge status: Home / Self Care | Payer: Medicaid Other | Attending: Emergency Medicine | Admitting: Emergency Medicine

## 2015-09-23 DIAGNOSIS — F17210 Nicotine dependence, cigarettes, uncomplicated: Secondary | ICD-10-CM | POA: Insufficient documentation

## 2015-09-23 DIAGNOSIS — Z792 Long term (current) use of antibiotics: Secondary | ICD-10-CM | POA: Insufficient documentation

## 2015-09-23 DIAGNOSIS — R569 Unspecified convulsions: Secondary | ICD-10-CM

## 2015-09-23 DIAGNOSIS — R109 Unspecified abdominal pain: Secondary | ICD-10-CM | POA: Insufficient documentation

## 2015-09-23 DIAGNOSIS — G40909 Epilepsy, unspecified, not intractable, without status epilepticus: Secondary | ICD-10-CM | POA: Diagnosis not present

## 2015-09-23 DIAGNOSIS — Z79899 Other long term (current) drug therapy: Secondary | ICD-10-CM | POA: Diagnosis not present

## 2015-09-23 LAB — URINALYSIS COMPLETE WITH MICROSCOPIC (ARMC ONLY)
BACTERIA UA: NONE SEEN
BILIRUBIN URINE: NEGATIVE
Glucose, UA: NEGATIVE mg/dL
HGB URINE DIPSTICK: NEGATIVE
KETONES UR: NEGATIVE mg/dL
LEUKOCYTES UA: NEGATIVE
NITRITE: NEGATIVE
PH: 8 (ref 5.0–8.0)
Protein, ur: NEGATIVE mg/dL
RBC / HPF: NONE SEEN RBC/hpf (ref 0–5)
SPECIFIC GRAVITY, URINE: 1.015 (ref 1.005–1.030)
Squamous Epithelial / LPF: NONE SEEN
WBC, UA: NONE SEEN WBC/hpf (ref 0–5)

## 2015-09-23 LAB — COMPREHENSIVE METABOLIC PANEL
ALT: 8 U/L — ABNORMAL LOW (ref 17–63)
ANION GAP: 6 (ref 5–15)
AST: 14 U/L — ABNORMAL LOW (ref 15–41)
Albumin: 4.4 g/dL (ref 3.5–5.0)
Alkaline Phosphatase: 47 U/L (ref 38–126)
BILIRUBIN TOTAL: 0.6 mg/dL (ref 0.3–1.2)
BUN: 10 mg/dL (ref 6–20)
CO2: 31 mmol/L (ref 22–32)
Calcium: 9.4 mg/dL (ref 8.9–10.3)
Chloride: 105 mmol/L (ref 101–111)
Creatinine, Ser: 0.93 mg/dL (ref 0.61–1.24)
GFR calc Af Amer: >60 mL/min (ref >60)
Glucose, Bld: 102 mg/dL — ABNORMAL HIGH (ref 65–99)
POTASSIUM: 4.4 mmol/L (ref 3.5–5.1)
Sodium: 142 mmol/L (ref 135–145)
TOTAL PROTEIN: 7.4 g/dL (ref 6.5–8.1)

## 2015-09-23 LAB — CBC WITH DIFFERENTIAL/PLATELET
BASOS ABS: 0 10*3/uL (ref 0–0.1)
BASOS PCT: 1 %
Eosinophils Absolute: 0.2 10*3/uL (ref 0–0.7)
Eosinophils Relative: 7 %
HEMATOCRIT: 41.4 % (ref 40.0–52.0)
Hemoglobin: 13.9 g/dL (ref 13.0–18.0)
Lymphocytes Relative: 40 %
Lymphs Abs: 1.1 10*3/uL (ref 1.0–3.6)
MCH: 28.5 pg (ref 26.0–34.0)
MCHC: 33.5 g/dL (ref 32.0–36.0)
MCV: 84.8 fL (ref 80.0–100.0)
MONO ABS: 0.3 10*3/uL (ref 0.2–1.0)
Monocytes Relative: 12 %
NEUTROS ABS: 1.1 10*3/uL — AB (ref 1.4–6.5)
NEUTROS PCT: 40 %
PLATELETS: 233 10*3/uL (ref 150–440)
RBC: 4.88 MIL/uL (ref 4.40–5.90)
RDW: 12.9 % (ref 11.5–14.5)
WBC: 2.7 10*3/uL — ABNORMAL LOW (ref 3.8–10.6)

## 2015-09-23 LAB — LIPASE, BLOOD: LIPASE: 33 U/L (ref 11–51)

## 2015-09-23 MED ORDER — SODIUM CHLORIDE 0.9 % IV BOLUS (SEPSIS)
500.0000 mL | Freq: Once | INTRAVENOUS | Status: AC
  Administered 2015-09-23: 500 mL via INTRAVENOUS

## 2015-09-23 MED ORDER — IOHEXOL 240 MG/ML SOLN
25.0000 mL | Freq: Once | INTRAMUSCULAR | Status: AC | PRN
  Administered 2015-09-23: 25 mL via ORAL

## 2015-09-23 MED ORDER — PANTOPRAZOLE SODIUM 20 MG PO TBEC
20.0000 mg | DELAYED_RELEASE_TABLET | Freq: Once | ORAL | Status: DC
  Filled 2015-09-23: qty 1

## 2015-09-23 MED ORDER — PANTOPRAZOLE SODIUM 20 MG PO TBEC: 20.0000 mg | DELAYED_RELEASE_TABLET | Freq: Every day | ORAL | Status: DC

## 2015-09-23 MED ORDER — IOHEXOL 300 MG/ML  SOLN
100.0000 mL | Freq: Once | INTRAMUSCULAR | Status: AC | PRN
  Administered 2015-09-23: 100 mL via INTRAVENOUS

## 2015-09-23 MED ORDER — ONDANSETRON HCL 4 MG/2ML IJ SOLN
4.0000 mg | Freq: Once | INTRAMUSCULAR | Status: AC
  Administered 2015-09-23: 4 mg via INTRAVENOUS
  Filled 2015-09-23: qty 2

## 2015-09-23 MED ORDER — PANTOPRAZOLE SODIUM 40 MG IV SOLR
40.0000 mg | Freq: Once | INTRAVENOUS | Status: AC
  Administered 2015-09-23: 40 mg via INTRAVENOUS
  Filled 2015-09-23: qty 40

## 2015-09-23 MED ORDER — PANTOPRAZOLE SODIUM 40 MG PO TBEC: 40.0000 mg | DELAYED_RELEASE_TABLET | Freq: Once | ORAL | Status: DC

## 2015-09-23 MED ORDER — LEVETIRACETAM 750 MG PO TABS: 750.0000 mg | ORAL_TABLET | Freq: Two times a day (BID) | ORAL | Status: DC

## 2015-09-23 NOTE — ED Provider Notes (Signed)
[facility] Emergency Department Provider Note  ____________________________________________  Time seen: Approximately 1:39 PM  I have reviewed the triage vital signs and the nursing notes.   HISTORY  Chief Complaint Abdominal Pain    HPI Keith Chung is a 35 y.o. male patient reports he's been having sharp stabbing abdominal pain and blood in his stool both right and red and dark blood blood is mixed with his stool and when he wipes his bottom afterwards. He's also vomited blood at least once. The pain blood in the stool and the vomiting is been going on for about 3 days. Patient reports he's been weak and lightheaded as well. Patient denies having a fever. Patient reports he had a seizure today. He takes his Keppra thousand milligrams a day 500 twice a day actually. [facility] He was November. He has seizures as often as weekly. But again his last seizure was November. He has not missed any doses. Patient reports the pain comes and goes and does not appear to be associated with anything in particular. It is made worse when he takes a deep breath. The pain is diffuse all over his abdomen.   Past Medical History  Diagnosis Date  . Seizures (HCC)     There are no active problems to display for this patient.   Past Surgical History  Procedure Laterality Date  . Vasectomy Bilateral   . Hand surgery      Current Outpatient Rx  Name  Route  Sig  Dispense  Refill  . levETIRAcetam (KEPPRA) 1000 MG tablet   Oral   Take 1 tablet (1,000 mg total) by mouth 2 (two) times daily.   60 tablet   0   . HYDROcodone-acetaminophen (NORCO/VICODIN) 5-325 MG tablet   Oral   Take 1 tablet by mouth every 4 (four) hours as needed for moderate pain. Patient not taking: Reported on 09/23/2015   12 tablet   0   . levETIRAcetam (KEPPRA) 750 MG tablet   Oral   Take 1 tablet (750 mg total) by mouth 2 (two) times daily.   60 tablet   1   .  pantoprazole (PROTONIX) 20 MG tablet   Oral   Take 1 tablet (20 mg total) by mouth daily.   30 tablet   1   . penicillin v potassium (VEETID) 500 MG tablet   Oral   Take 1 tablet (500 mg total) by mouth 4 (four) times daily. Patient not taking: Reported on 08/02/2015   40 tablet   0     Allergies Pollen extract  History reviewed. No pertinent family history.  Social History Social History  Substance Use Topics  . Smoking status: Light Tobacco Smoker -- 0.30 packs/day    Types: Cigarettes    Last Attempt to Quit: 07/18/2014  . Smokeless tobacco: None  . Alcohol Use: 3.0 oz/week    5 Cans of beer per week     Comment: socially    Review of Systems Constitutional: No fever/chills Eyes: No visual changes. ENT: No sore throat. Cardiovascular: Denies chest pain. Respiratory: Denies shortness of breath. Gastrointestinal: See history of present illness Genitourinary: Negative for dysuria. Musculoskeletal: Negative for back pain. Skin: Negative for rash. Neurological: Negative for headaches, focal weakness or numbness.  10-point ROS otherwise negative.  ____________________________________________   PHYSICAL EXAM:  VITAL SIGNS: ED Triage Vitals  Enc Vitals Group     BP 09/23/15 1032 137/86 mmHg  Pulse Rate 09/23/15 1032 59     Resp 09/23/15 1032 16     Temp 09/23/15 1032 98 F (36.7 C)     Temp Source 09/23/15 1032 Oral     SpO2 09/23/15 1024 100 %     Weight 09/23/15 1032 165 lb (74.844 kg)     Height 09/23/15 1032 6\' 2"  (1.88 m)     Head Cir --      Peak Flow --      Pain Score 09/23/15 1032 10     Pain Loc --      Pain Edu? --      Excl. in GC? --     Constitutional: Alert and oriented. Well appearing and in no acute distress. Eyes: Conjunctivae are normal. PERRL. EOMI. Head: Atraumatic. Nose: No congestion/rhinnorhea. Mouth/Throat: Mucous membranes are moist.  Oropharynx non-erythematous. Neck: No stridor.   Cardiovascular: Normal rate,  regular rhythm. Grossly normal heart sounds.  Good peripheral circulation. Respiratory: Normal respiratory effort.  No retractions. Lungs CTAB. Gastrointestinal: Soft and diffusely tender to palpation percussion.. No distention. No abdominal bruits. No CVA tenderness. Rectal: Normal anus rectal exam is Hemoccult-negative no masses are felt prostate normal to palpation Musculoskeletal: No lower extremity tenderness nor edema.  No joint effusions. Neurologic:  Normal speech and language. No gross focal neurologic deficits are appreciated. No gait instability. Skin:  Skin is warm, dry and intact. No rash noted. Psychiatric: Mood and affect are normal. Speech and behavior are normal.  ____________________________________________   LABS (all labs ordered are listed, but only abnormal results are displayed)  Labs Reviewed  COMPREHENSIVE METABOLIC PANEL - Abnormal; Notable for the following:    Glucose, Bld 102 (*)    AST 14 (*)    ALT 8 (*)    All other components within normal limits  CBC WITH DIFFERENTIAL/PLATELET - Abnormal; Notable for the following:    WBC 2.7 (*)    Neutro Abs 1.1 (*)    All other components within normal limits  URINALYSIS COMPLETEWITH MICROSCOPIC ([facility] ONLY) - Abnormal; Notable for the following:    Color, Urine STRAW (*)    APPearance CLEAR (*)    All other components within normal limits  LIPASE, BLOOD  LEVETIRACETAM LEVEL   ____________________________________________  EKG   ____________________________________________  RADIOLOGY  CT of the abdomen read as normal by radiology ____________________________________________   PROCEDURES   ____________________________________________   INITIAL IMPRESSION / ASSESSMENT AND PLAN / ED COURSE  Pertinent labs & imaging results that were available during my care of the patient were reviewed by me and considered in my medical decision making (see chart for details).  Discussed patient with neurology  she recommends increasing the Keppra to 750 twice a day. Dr. Thad Rangereynolds at his ____________________________________________   FINAL CLINICAL IMPRESSION(S) / ED DIAGNOSES  Final diagnoses:  Abdominal pain, unspecified abdominal location  Seizures [facility](HCC)      Arnaldo NatalPaul F Mckenna Gamm, MD 09/23/15 2131

## 2015-09-23 NOTE — Discharge Instructions (Signed)
Abdominal Pain, Adult Many things can cause abdominal pain. Usually, abdominal pain is not caused by a disease and will improve without treatment. It can often be observed and treated at home. Your health care provider will do a physical exam and possibly order blood tests and X-rays to help determine the seriousness of your pain. However, in many cases, more time must pass before a clear cause of the pain can be found. Before that point, your health care provider may not know if you need more testing or further treatment. HOME CARE INSTRUCTIONS Monitor your abdominal pain for any changes. The following actions may help to alleviate any discomfort you are experiencing:  Only take over-the-counter or prescription medicines as directed by your health care provider.  Do not take laxatives unless directed to do so by your health care provider.  Try a clear liquid diet (broth, tea, or water) as directed by your health care provider. Slowly move to a bland diet as tolerated. SEEK MEDICAL CARE IF:  You have unexplained abdominal pain.  You have abdominal pain associated with nausea or diarrhea.  You have pain when you urinate or have a bowel movement.  You experience abdominal pain that wakes you in the night.  You have abdominal pain that is worsened or improved by eating food.  You have abdominal pain that is worsened with eating fatty foods.  You have a fever. SEEK IMMEDIATE MEDICAL CARE IF:  Your pain does not go away within 2 hours.  You keep throwing up (vomiting).  Your pain is felt only in portions of the abdomen, such as the right side or the left lower portion of the abdomen.  You pass bloody or black tarry stools. MAKE SURE YOU:  Understand these instructions.  Will watch your condition.  Will get help right away if you are not doing well or get worse.   This information is not intended to replace advice given to you by your health care provider. Make sure you discuss  any questions you have with your health care provider.   Document Released: 06/13/2005 Document Revised: 05/25/2015 Document Reviewed: 05/13/2013 Elsevier Interactive Patient Education [employer]2016 Elsevier Inc.  I spoke to Dr. Thad Rangereynolds a neurologist. She recommends increasing her Keppra to 750 mg twice a day. I will give you some Protonix for the blood that she had vomited up. Please call Dr. wall the gastroenterologist he should be able to see you some time relatively soon. Return here for any pain fever more blood in the stool or vomit she get weak or lightheaded. Right now everything looks okay except for it seems like you have a viral infection which might make you do not feel so good.

## 2015-09-23 NOTE — ED Notes (Signed)
Pt arrived by EMS with complaints of abdominal pain. Pt states he has had n/v and diarrhea for 3 days. Pt states that he also noticed dark blood and then bright blood in his stool 2 days ago. Also states that he feels as if he has had a seizure today but has been taking his kepra and took it this morning. Pt states he is having difficulty urinating and weakness when attempting to walk.

## 2015-09-25 ENCOUNTER — Encounter: Payer: Self-pay | Admitting: Emergency Medicine

## 2015-09-25 ENCOUNTER — Emergency Department
Admission: EM | Admit: 2015-09-25 | Discharge: 2015-09-25 | Disposition: A | Discharge status: Home / Self Care | Payer: Medicaid Other | Attending: Emergency Medicine | Admitting: Emergency Medicine

## 2015-09-25 ENCOUNTER — Emergency Department: Payer: Medicaid Other

## 2015-09-25 DIAGNOSIS — Y998 Other external cause status: Secondary | ICD-10-CM | POA: Diagnosis not present

## 2015-09-25 DIAGNOSIS — G40909 Epilepsy, unspecified, not intractable, without status epilepticus: Secondary | ICD-10-CM | POA: Diagnosis not present

## 2015-09-25 DIAGNOSIS — Z79899 Other long term (current) drug therapy: Secondary | ICD-10-CM | POA: Insufficient documentation

## 2015-09-25 DIAGNOSIS — F17210 Nicotine dependence, cigarettes, uncomplicated: Secondary | ICD-10-CM | POA: Diagnosis not present

## 2015-09-25 DIAGNOSIS — Y9389 Activity, other specified: Secondary | ICD-10-CM | POA: Diagnosis not present

## 2015-09-25 DIAGNOSIS — R569 Unspecified convulsions: Secondary | ICD-10-CM

## 2015-09-25 DIAGNOSIS — X58XXXA Exposure to other specified factors, initial encounter: Secondary | ICD-10-CM | POA: Insufficient documentation

## 2015-09-25 DIAGNOSIS — Z792 Long term (current) use of antibiotics: Secondary | ICD-10-CM | POA: Diagnosis not present

## 2015-09-25 DIAGNOSIS — Y9289 Other specified places as the place of occurrence of the external cause: Secondary | ICD-10-CM | POA: Insufficient documentation

## 2015-09-25 DIAGNOSIS — F1210 Cannabis abuse, uncomplicated: Secondary | ICD-10-CM | POA: Insufficient documentation

## 2015-09-25 DIAGNOSIS — S0993XA Unspecified injury of face, initial encounter: Secondary | ICD-10-CM | POA: Insufficient documentation

## 2015-09-25 LAB — URINE DRUG SCREEN, QUALITATIVE (ARMC ONLY)
AMPHETAMINES, UR SCREEN: NOT DETECTED
Barbiturates, Ur Screen: NOT DETECTED
Benzodiazepine, Ur Scrn: NOT DETECTED
COCAINE METABOLITE, UR ~~LOC~~: NOT DETECTED
Cannabinoid 50 Ng, Ur ~~LOC~~: POSITIVE — AB
MDMA (ECSTASY) UR SCREEN: NOT DETECTED
Methadone Scn, Ur: NOT DETECTED
OPIATE, UR SCREEN: NOT DETECTED
PHENCYCLIDINE (PCP) UR S: NOT DETECTED
Tricyclic, Ur Screen: NOT DETECTED

## 2015-09-25 LAB — CBC WITH DIFFERENTIAL/PLATELET
BASOS ABS: 0.1 10*3/uL (ref 0–0.1)
Basophils Relative: 1 %
Eosinophils Absolute: 0.1 10*3/uL (ref 0–0.7)
Eosinophils Relative: 3 %
HEMATOCRIT: 43 % (ref 40.0–52.0)
Hemoglobin: 14.7 g/dL (ref 13.0–18.0)
LYMPHS ABS: 1.1 10*3/uL (ref 1.0–3.6)
LYMPHS PCT: 24 %
MCH: 28.8 pg (ref 26.0–34.0)
MCHC: 34.1 g/dL (ref 32.0–36.0)
MCV: 84.4 fL (ref 80.0–100.0)
Monocytes Absolute: 0.4 10*3/uL (ref 0.2–1.0)
Monocytes Relative: 8 %
NEUTROS ABS: 2.9 10*3/uL (ref 1.4–6.5)
Neutrophils Relative %: 64 %
Platelets: 268 10*3/uL (ref 150–440)
RBC: 5.09 MIL/uL (ref 4.40–5.90)
RDW: 12.8 % (ref 11.5–14.5)
WBC: 4.5 10*3/uL (ref 3.8–10.6)

## 2015-09-25 LAB — BASIC METABOLIC PANEL
Anion gap: 6 (ref 5–15)
BUN: 13 mg/dL (ref 6–20)
CHLORIDE: 103 mmol/L (ref 101–111)
CO2: 30 mmol/L (ref 22–32)
CREATININE: 1.06 mg/dL (ref 0.61–1.24)
Calcium: 9.7 mg/dL (ref 8.9–10.3)
GFR calc Af Amer: >60 mL/min (ref >60)
GLUCOSE: 102 mg/dL — AB (ref 65–99)
Potassium: 3.7 mmol/L (ref 3.5–5.1)
SODIUM: 139 mmol/L (ref 135–145)

## 2015-09-25 LAB — LEVETIRACETAM LEVEL: LEVETIRACETAM: 63.8 ug/mL — AB (ref 10.0–40.0)

## 2015-09-25 MED ORDER — KETOROLAC TROMETHAMINE 30 MG/ML IJ SOLN
30.0000 mg | Freq: Once | INTRAMUSCULAR | Status: AC
  Administered 2015-09-25: 30 mg via INTRAVENOUS
  Filled 2015-09-25: qty 1

## 2015-09-25 MED ORDER — LEVETIRACETAM 500 MG PO TABS
500.0000 mg | ORAL_TABLET | Freq: Once | ORAL | Status: AC
  Administered 2015-09-25: 500 mg via ORAL
  Filled 2015-09-25: qty 1

## 2015-09-25 NOTE — Discharge Instructions (Signed)
Seizure, Adult A seizure is abnormal electrical activity in the brain. Seizures usually last from 30 seconds to 2 minutes. There are various types of seizures. Before a seizure, you may have a warning sensation (aura) that a seizure is about to occur. An aura may include the following symptoms:   Fear or anxiety.  Nausea.  Feeling like the room is spinning (vertigo).  Vision changes, such as seeing flashing lights or spots. Common symptoms during a seizure include:  A change in attention or behavior (altered mental status).  Convulsions with rhythmic jerking movements.  Drooling.  Rapid eye movements.  Grunting.  Loss of bladder and bowel control.  Bitter taste in the mouth.  Tongue biting. After a seizure, you may feel confused and sleepy. You may also have an injury resulting from convulsions during the seizure. HOME CARE INSTRUCTIONS   If you are given medicines, take them exactly as prescribed by your health care provider.  Keep all follow-up appointments as directed by your health care provider.  Do not swim or drive or engage in risky activity during which a seizure could cause further injury to you or others until your health care provider says it is OK.  Get adequate rest.  Teach friends and family what to do if you have a seizure. They should:  Lay you on the ground to prevent a fall.  Put a cushion under your head.  Loosen any tight clothing around your neck.  Turn you on your side. If vomiting occurs, this helps keep your airway clear.  Stay with you until you recover.  Know whether or not you need emergency care. SEEK IMMEDIATE MEDICAL CARE IF:  The seizure lasts longer than 5 minutes.  The seizure is severe or you do not wake up immediately after the seizure.  You have an altered mental status after the seizure.  You are having more frequent or worsening seizures. Someone should drive you to the emergency department or call local emergency  services (911 in [place] MAKE SURE YOU:  Understand these instructions.  Will watch your condition.  Will get help right away if you are not doing well or get worse.   This information is not intended to replace advice given to you by your health care provider. Make sure you discuss any questions you have with your health care provider.   Document Released: 08/31/2000 Document Revised: 09/24/2014 Document Reviewed: 04/15/2013 Elsevier Interactive Patient Education [employer] Elsevier Inc.  Please return immediately if condition worsens. Please contact her primary physician or the physician you were given for referral. If you have any specialist physicians involved in her treatment and plan please also contact them. Thank you for using [facility]. Continue with your current dosage of your Keppra. Please call the neurologist for further evaluation and possible addition of a new medication if necessary.

## 2015-09-25 NOTE — ED Notes (Signed)
Patient off the floor in CT.

## 2015-09-25 NOTE — ED Notes (Addendum)
Patient brought in by EMS for possible seizure. Patient was seen in ED today or yesterday for abdominal pain and had a seizure while here. Keppra dose was increased but patient is unsure if he received a dose while he was here in the ED.  Per EMS BP 150/80, HR 90, O2 sat 98% on room air

## 2015-09-25 NOTE — ED Provider Notes (Signed)
Time Seen: Approximately 1652  I have reviewed the triage notes  Chief Complaint: Seizures   History of Present Illness: Keith Chung is a 35 y.o. male who describes feelings that he was going to have a seizure at home. He somewhat vague on the description of his symptoms but states that he has a long history of seizures can can often feels when a seizure will occur. Patient was found by EMS on the floor. States he did bite his lip. He denies any tongue trauma. He did not urinate or defecate on himself. Patient's been seen for seizures here before in the past and was recently evaluated for what sounds like some gastroenteritis. Patient had described blood in his stool but was guaiac negative and otherwise hemodynamically stable. He denies any complaints along these lines at this time and denies any fever at home. He states his nausea vomiting and diarrhea have improved and he has maintained ingestion of his Keppra. He was recently increased 750 mg twice a day just 2 days ago. The patient states he has no local neurologist. He was tried on Dilantin but he states it made him feel very groggy. The patient describes a headache and is not sure whether he fell and hit his head. He is not aware of any other trauma. He denies any chest or shoulder pain   Past Medical History  Diagnosis Date  . Seizures (HCC)     There are no active problems to display for this patient.   Past Surgical History  Procedure Laterality Date  . Vasectomy Bilateral   . Hand surgery      Past Surgical History  Procedure Laterality Date  . Vasectomy Bilateral   . Hand surgery      Current Outpatient Rx  Name  Route  Sig  Dispense  Refill  . HYDROcodone-acetaminophen (NORCO/VICODIN) 5-325 MG tablet   Oral   Take 1 tablet by mouth every 4 (four) hours as needed for moderate pain. Patient not taking: Reported on 09/23/2015   12 tablet   0   . levETIRAcetam (KEPPRA) 1000 MG tablet   Oral   Take 1 tablet  (1,000 mg total) by mouth 2 (two) times daily.   60 tablet   0   . levETIRAcetam (KEPPRA) 750 MG tablet   Oral   Take 1 tablet (750 mg total) by mouth 2 (two) times daily.   60 tablet   1   . pantoprazole (PROTONIX) 20 MG tablet   Oral   Take 1 tablet (20 mg total) by mouth daily.   30 tablet   1   . penicillin v potassium (VEETID) 500 MG tablet   Oral   Take 1 tablet (500 mg total) by mouth 4 (four) times daily. Patient not taking: Reported on 08/02/2015   40 tablet   0     Allergies:  Pollen extract  Family History: No family history on file.  Social History: Social History  Substance Use Topics  . Smoking status: Light Tobacco Smoker -- 0.30 packs/day    Types: Cigarettes    Last Attempt to Quit: 07/18/2014  . Smokeless tobacco: None  . Alcohol Use: 3.0 oz/week    5 Cans of beer per week     Comment: socially     Review of Systems:   10 point review of systems was performed and was otherwise negative:  Constitutional: No fever Eyes: No visual disturbances ENT: No sore throat, ear pain Cardiac: No chest pain  Respiratory: No shortness of breath, wheezing, or stridor Abdomen: No abdominal pain, no vomiting, No diarrhea Endocrine: No weight loss, No night sweats Extremities: No peripheral edema, cyanosis Skin: No rashes, easy bruising Neurologic: No focal weakness, trouble with speech or swollowing Urologic: No dysuria, Hematuria, or urinary frequency   Physical Exam:  ED Triage Vitals  Enc Vitals Group     BP 09/25/15 1700 139/100 mmHg     Pulse Rate 09/25/15 1700 73     Resp 09/25/15 1703 10     Temp 09/25/15 1703 98.1 F (36.7 C)     Temp Source 09/25/15 1703 Oral     SpO2 09/25/15 1700 82 %     Weight 09/25/15 1703 161 lb 3.2 oz (73.12 kg)     Height 09/25/15 1703 6\' 2"  (1.88 m)     Head Cir --      Peak Flow --      Pain Score 09/25/15 1704 10     Pain Loc --      Pain Edu? --      Excl. in GC? --     General: Awake , Alert , and  Oriented times 3; GCS 15 Head: Normal cephalic , atraumatic Eyes: Pupils equal , round, reactive to light Nose/Throat: No nasal drainage, patent upper airway without erythema or exudate. Very slight trauma to the lower lip Neck: Supple, Full range of motion, No anterior adenopathy or palpable thyroid masses Lungs: Clear to ascultation without wheezes , rhonchi, or rales Heart: Regular rate, regular rhythm without murmurs , gallops , or rubs Abdomen: Soft, non tender without rebound, guarding , or rigidity; bowel sounds positive and symmetric in all 4 quadrants. No organomegaly .        Extremities: 2 plus symmetric pulses. No edema, clubbing or cyanosis Neurologic: normal ambulation, Motor symmetric without deficits, sensory intact Skin: warm, dry, no rashes   Labs:   All laboratory work was reviewed including any pertinent negatives or positives listed below:  Labs Reviewed  BASIC METABOLIC PANEL - Abnormal; Notable for the following:    Glucose, Bld 102 (*)    All other components within normal limits  URINE DRUG SCREEN, QUALITATIVE ([facility] ONLY) - Abnormal; Notable for the following:    Cannabinoid 50 Ng, Ur [facility] POSITIVE (*)    All other components within normal limits  CBC WITH DIFFERENTIAL/PLATELET     Radiology:    EXAM: CT HEAD WITHOUT CONTRAST  TECHNIQUE: Contiguous axial images were obtained from the base of the skull through the vertex without intravenous contrast.  COMPARISON: 08/02/2015  FINDINGS: No mass lesion. No midline shift. No acute hemorrhage or hematoma. No extra-axial fluid collections. No evidence of acute infarction. No skull fracture.  IMPRESSION: Stable normal head CT        I personally reviewed the radiologic studies     ED Course: * Patient's stay here was uneventful. He does admit to marijuana usage but none today. The patient states improvement of his headache. I felt like we could not increase his Keppra anymore and is only been  on the new dosage now for the last 48 hours maximum. He was given some oral Keppra here but also advised with his mom present that he needs the input of a neurologist for further possible addition of other medications. He may be maxed out on his Keppra at this time. As far as his seizure history or his not appear to be any indications of status epilepticus. There does not  appear to be any significant trauma or correctable source for his seizures at this time. Review of the records shows that there has been a acceleration of his seizures.  Assessment:  Acute exacerbation of chronic seizure disorder    Final Clinical Impression:  Final diagnoses:  Seizure [facility](HCC)     Plan: * Outpatient Patient was advised to return immediately if condition worsens. Patient was advised to follow up with their primary care physician or other specialized physicians involved in their outpatient care             Jennye MoccasinBrian S Quigley, MD 09/25/15 2032

## 2015-10-01 ENCOUNTER — Emergency Department
Admission: EM | Admit: 2015-10-01 | Discharge: 2015-10-01 | Disposition: A | Discharge status: Home / Self Care | Payer: Medicaid Other | Attending: Emergency Medicine | Admitting: Emergency Medicine

## 2015-10-01 DIAGNOSIS — R569 Unspecified convulsions: Secondary | ICD-10-CM | POA: Insufficient documentation

## 2015-10-01 DIAGNOSIS — F17210 Nicotine dependence, cigarettes, uncomplicated: Secondary | ICD-10-CM | POA: Insufficient documentation

## 2015-10-01 DIAGNOSIS — R51 Headache: Secondary | ICD-10-CM | POA: Insufficient documentation

## 2015-10-01 LAB — CBC WITH DIFFERENTIAL/PLATELET
BASOS PCT: 2 %
Basophils Absolute: 0.1 10*3/uL (ref 0–0.1)
EOS ABS: 0.1 10*3/uL (ref 0–0.7)
EOS PCT: 5 %
HCT: 42.8 % (ref 40.0–52.0)
Hemoglobin: 14.4 g/dL (ref 13.0–18.0)
LYMPHS ABS: 0.9 10*3/uL — AB (ref 1.0–3.6)
Lymphocytes Relative: 30 %
MCH: 29 pg (ref 26.0–34.0)
MCHC: 33.7 g/dL (ref 32.0–36.0)
MCV: 85.9 fL (ref 80.0–100.0)
MONOS PCT: 9 %
Monocytes Absolute: 0.3 10*3/uL (ref 0.2–1.0)
Neutro Abs: 1.7 10*3/uL (ref 1.4–6.5)
Neutrophils Relative %: 54 %
PLATELETS: 234 10*3/uL (ref 150–440)
RBC: 4.98 MIL/uL (ref 4.40–5.90)
RDW: 12.9 % (ref 11.5–14.5)
WBC: 3.1 10*3/uL — AB (ref 3.8–10.6)

## 2015-10-01 LAB — COMPREHENSIVE METABOLIC PANEL
ALBUMIN: 4.4 g/dL (ref 3.5–5.0)
ALT: 10 U/L — ABNORMAL LOW (ref 17–63)
ANION GAP: 5 (ref 5–15)
AST: 15 U/L (ref 15–41)
Alkaline Phosphatase: 45 U/L (ref 38–126)
BUN: 10 mg/dL (ref 6–20)
CALCIUM: 9.4 mg/dL (ref 8.9–10.3)
CHLORIDE: 107 mmol/L (ref 101–111)
CO2: 28 mmol/L (ref 22–32)
Creatinine, Ser: 1 mg/dL (ref 0.61–1.24)
GFR calc non Af Amer: >60 mL/min (ref >60)
GLUCOSE: 99 mg/dL (ref 65–99)
POTASSIUM: 4.7 mmol/L (ref 3.5–5.1)
SODIUM: 140 mmol/L (ref 135–145)
Total Bilirubin: 0.9 mg/dL (ref 0.3–1.2)
Total Protein: 7.4 g/dL (ref 6.5–8.1)

## 2015-10-01 LAB — URINE DRUG SCREEN, QUALITATIVE (ARMC ONLY)
AMPHETAMINES, UR SCREEN: NOT DETECTED
BENZODIAZEPINE, UR SCRN: NOT DETECTED
Barbiturates, Ur Screen: NOT DETECTED
COCAINE METABOLITE, UR ~~LOC~~: NOT DETECTED
Cannabinoid 50 Ng, Ur ~~LOC~~: NOT DETECTED
MDMA (Ecstasy)Ur Screen: NOT DETECTED
Methadone Scn, Ur: NOT DETECTED
OPIATE, UR SCREEN: NOT DETECTED
PHENCYCLIDINE (PCP) UR S: NOT DETECTED
Tricyclic, Ur Screen: NOT DETECTED

## 2015-10-01 LAB — ETHANOL: Alcohol, Ethyl (B): <5 mg/dL (ref <5)

## 2015-10-01 MED ORDER — LEVETIRACETAM 500 MG PO TABS: 1500.0000 mg | ORAL_TABLET | Freq: Two times a day (BID) | ORAL | Status: DC

## 2015-10-01 NOTE — ED Provider Notes (Signed)
[facility] Emergency Department Provider Note     Time seen: ----------------------------------------- 10:41 AM on 10/01/2015 -----------------------------------------  Level V caveat: Review of systems and history is limited by post ictal state.  I have reviewed the triage vital signs and the nursing notes.   HISTORY  Chief Complaint Seizures    HPI Keith Chung is a 35 y.o. male who presents ER after a seizure-like event. Patient states on arrival he doesn't remember anything, EMS reports that witnesses seizure lasting 2-3 minutes and when they got to the scene he was incontinent of urine. He currently takes Keppra, unsure if he's been compliant or not. Patient is complaining of headache at this time.   Past Medical History  Diagnosis Date  . Seizures (HCC)     There are no active problems to display for this patient.   Past Surgical History  Procedure Laterality Date  . Vasectomy Bilateral   . Hand surgery      Allergies Pollen extract  Social History Social History  Substance Use Topics  . Smoking status: Light Tobacco Smoker -- 0.30 packs/day    Types: Cigarettes    Last Attempt to Quit: 07/18/2014  . Smokeless tobacco: Not on file  . Alcohol Use: 3.0 oz/week    5 Cans of beer per week     Comment: socially    Review of Systems Patient denies other complaints Neurological: Positive for headache   ____________________________________________   PHYSICAL EXAM:  VITAL SIGNS: ED Triage Vitals  Enc Vitals Group     BP 10/01/15 1024 141/100 mmHg     Pulse Rate 10/01/15 1024 51     Resp --      Temp 10/01/15 1024 97.9 F (36.6 C)     Temp Source 10/01/15 1024 Oral     SpO2 10/01/15 1024 100 %     Weight 10/01/15 1024 158 lb 3.2 oz (71.759 kg)     Height 10/01/15 1024 6' (1.829 m)     Head Cir --      Peak Flow --      Pain Score 10/01/15 1025 10     Pain Loc --      Pain Edu? --      Excl. in GC? --      Constitutional: Alert but disoriented. No distress Eyes: Conjunctivae are normal. PERRL. Normal extraocular movements. ENT   Head: Normocephalic and atraumatic.   Nose: No congestion/rhinnorhea.   Mouth/Throat: Mucous membranes are moist. Patient did not bite his tongue   Neck: No stridor. Cardiovascular: Normal rate, regular rhythm. Normal and symmetric distal pulses are present in all extremities. No murmurs, rubs, or gallops. Respiratory: Normal respiratory effort without tachypnea nor retractions. Breath sounds are clear and equal bilaterally. No wheezes/rales/rhonchi. Gastrointestinal: Soft and nontender. No distention. No abdominal bruits.  Musculoskeletal: Nontender with normal range of motion in all extremities. No joint effusions.  No lower extremity tenderness nor edema. Neurologic:  Normal speech and language. No gross focal neurologic deficits are appreciated. Speech is normal. No gait instability. Skin:  Skin is warm, dry and intact. No rash noted. Psychiatric: Mood and affect are normal. Speech and behavior are normal.  ____________________________________________  ED COURSE:  Pertinent labs & imaging results that were available during my care of the patient were reviewed by me and considered in my medical decision making (see chart for details). Patient is post ictal state. We'll check basic labs and reevaluate. ____________________________________________    LABS (pertinent positives/negatives)  Labs Reviewed  CBC WITH DIFFERENTIAL/PLATELET - Abnormal; Notable for the following:    WBC 3.1 (*)    Lymphs Abs 0.9 (*)    All other components within normal limits  COMPREHENSIVE METABOLIC PANEL - Abnormal; Notable for the following:    ALT 10 (*)    All other components within normal limits  URINE DRUG SCREEN, QUALITATIVE ([facility] ONLY)  ETHANOL   ____________________________________________  FINAL ASSESSMENT AND PLAN  Seizure  Plan: Patient with  labs and imaging as dictated above. Patient with breakthrough seizures despite recent increase in his medication. Patient is awake alert and oriented, I will increase his Keppra and advise close follow-up with neurology.   Emily Filbert, MD   Emily Filbert, MD 10/01/15 [phone]

## 2015-10-01 NOTE — Discharge Instructions (Signed)

## 2015-10-01 NOTE — ED Notes (Signed)
Pt states that he"don't remember nothing" pt appears postictal at this time, ems reports that they witnessed a seizure lasting aprox 2-3 min when they got on scene, pt was incontinent of urine. Pt takes keppra for his seizures

## 2015-10-01 NOTE — ED Notes (Signed)
Patients mother called for update on patient; patient gave verbal OK to speak with this mother. Patient mother informed that patient was brought in by EMS for a seizure and that we were currently waiting on lab results to come back and at this time we did not have any further updated. Mother stated understanding.

## 2015-10-05 ENCOUNTER — Ambulatory Visit: Payer: Self-pay | Admitting: Internal Medicine

## 2015-10-07 ENCOUNTER — Encounter: Payer: Self-pay | Admitting: Emergency Medicine

## 2015-10-07 ENCOUNTER — Emergency Department
Admission: EM | Admit: 2015-10-07 | Discharge: 2015-10-07 | Disposition: A | Discharge status: Home / Self Care | Payer: Medicaid Other | Attending: Emergency Medicine | Admitting: Emergency Medicine

## 2015-10-07 DIAGNOSIS — R569 Unspecified convulsions: Secondary | ICD-10-CM | POA: Insufficient documentation

## 2015-10-07 DIAGNOSIS — Z79899 Other long term (current) drug therapy: Secondary | ICD-10-CM | POA: Diagnosis not present

## 2015-10-07 DIAGNOSIS — F17210 Nicotine dependence, cigarettes, uncomplicated: Secondary | ICD-10-CM | POA: Insufficient documentation

## 2015-10-07 MED ORDER — VALPROATE SODIUM 500 MG/5ML IV SOLN
1000.0000 mg | Freq: Once | INTRAVENOUS | Status: AC
  Administered 2015-10-07: 1000 mg via INTRAVENOUS
  Filled 2015-10-07: qty 10

## 2015-10-07 MED ORDER — DIVALPROEX SODIUM 500 MG PO DR TAB: 500.0000 mg | DELAYED_RELEASE_TABLET | Freq: Two times a day (BID) | ORAL | Status: DC

## 2015-10-07 NOTE — ED Notes (Signed)
Brought in via ems  S/p sz   Per ems he report having 2-3 times today  Alert up on ems arrival and able to walk to stretcher

## 2015-10-07 NOTE — ED Provider Notes (Signed)
[facility] Emergency Department Provider Note   ____________________________________________  Time seen:  I have reviewed the triage vital signs and the triage nursing note.  HISTORY  Chief Complaint Seizures   Historian Patient  HPI Keith Chung is a 35 y.o. male with a history of frequent seizures. Patient states mom witnessed seizure today. I spoke with mom on the phone and she stated that the patient was staring and then when she came back and he was twitching all over. Unclear how long the episode lasted. Patient is back to baseline now. No injuries. Patient reports compliance with medication. He takes Keppra currently 1500 mg twice daily, since last ER visit on 10/01/15.  No PCP, [facility]. States he appears a bit on Dilantin and did not tolerated. Has not seen a neurologist.  On my chart reviewpatient was seen at least once in October, November, and December. He has been seen on jittery 6, Gen. rate, January 14 and again today with seizure. From what I can discern he had been previously thousand milligrams Keppra twice a day in the fall. Unclear why, but on January 6 he reported being on 500 mg twice a day and so he was increased to 750 g twice a day on each occasion in January he was increased including the last dose increase was on 10/01/2015 2000 mg twice a day.  Patient is denying illicit drug use or alcohol use.    Past Medical History  Diagnosis Date  . Seizures (HCC)     There are no active problems to display for this patient.   Past Surgical History  Procedure Laterality Date  . Vasectomy Bilateral   . Hand surgery      Current Outpatient Rx  Name  Route  Sig  Dispense  Refill  . divalproex (DEPAKOTE) 500 MG DR tablet   Oral   Take 1 tablet (500 mg total) by mouth 2 (two) times daily.   60 tablet   1   . HYDROcodone-acetaminophen (NORCO/VICODIN) 5-325 MG tablet   Oral   Take 1 tablet by mouth every 4  (four) hours as needed for moderate pain. Patient not taking: Reported on 09/23/2015   12 tablet   0   . levETIRAcetam (KEPPRA) 500 MG tablet   Oral   Take 3 tablets (1,500 mg total) by mouth 2 (two) times daily.   180 tablet   2   . pantoprazole (PROTONIX) 20 MG tablet   Oral   Take 1 tablet (20 mg total) by mouth daily.   30 tablet   1   . penicillin v potassium (VEETID) 500 MG tablet   Oral   Take 1 tablet (500 mg total) by mouth 4 (four) times daily. Patient not taking: Reported on 08/02/2015   40 tablet   0     Allergies Pollen extract  No family history on file.  Social History Social History  Substance Use Topics  . Smoking status: Light Tobacco Smoker -- 0.30 packs/day    Types: Cigarettes    Last Attempt to Quit: 07/18/2014  . Smokeless tobacco: None  . Alcohol Use: No     Comment: socially    Review of Systems  Constitutional: Negative for fever. Eyes: Negative for visual changes. ENT: Negative for sore throat. Cardiovascular: Negative for chest pain. Respiratory: Negative for shortness of breath. Gastrointestinal: Negative for abdominal pain, vomiting and diarrhea. Genitourinary: Negative for dysuria. Musculoskeletal: Negative for back pain. Skin: Negative for rash.  Neurological: Negative for headache. 10 point Review of Systems otherwise negative ____________________________________________   PHYSICAL EXAM:  VITAL SIGNS: ED Triage Vitals  Enc Vitals Group     BP 10/07/15 1849 132/84 mmHg     Pulse Rate 10/07/15 1849 66     Resp 10/07/15 1849 18     Temp 10/07/15 1849 97.8 F (36.6 C)     Temp Source 10/07/15 1849 Oral     SpO2 10/07/15 1849 99 %     Weight 10/07/15 1849 158 lb (71.668 kg)     Height 10/07/15 1849  (1.88 m)     Head Cir --      Peak Flow --      Pain Score 10/07/15 1849 9     Pain Loc --      Pain Edu? --      Excl. in GC? --      Constitutional: Alert and oriented. Well appearing and in no  distress. Eyes: Conjunctivae are normal. PERRL. Normal extraocular movements. ENT   Head: Normocephalic and atraumatic.   Nose: No congestion/rhinnorhea.   Mouth/Throat: Mucous membranes are moist.   Neck: No stridor. Cardiovascular/Chest: Normal rate, regular rhythm.  No murmurs, rubs, or gallops. Respiratory: Normal respiratory effort without tachypnea nor retractions. Breath sounds are clear and equal bilaterally. No wheezes/rales/rhonchi. Gastrointestinal: Soft. No distention, no guarding, no rebound. Nontender.    Genitourinary/rectal:Deferred Musculoskeletal: Nontender with normal range of motion in all extremities. No joint effusions.  No lower extremity tenderness.  No edema. Neurologic:  Normal speech and language. No gross or focal neurologic deficits are appreciated. Skin:  Skin is warm, dry and intact. No rash noted. Psychiatric: Mood and affect are normal. Speech and behavior are normal. Patient exhibits appropriate insight and judgment.  ____________________________________________   EKG I, Governor Rooks, MD, the attending physician have personally viewed and interpreted all ECGs.  None ____________________________________________  LABS (pertinent positives/negatives)  None  ____________________________________________  RADIOLOGY All Xrays were viewed by me. Imaging interpreted by Radiologist.  None __________________________________________  PROCEDURES  Procedure(s) performed: None  Critical Care performed: None  ____________________________________________   ED COURSE / ASSESSMENT AND PLAN  Pertinent labs & imaging results that were available during my care of the patient were reviewed by me and considered in my medical decision making (see chart for details).    Patient is back to baseline. I discussed this case with the neurologist, Dr. Thad Ranger, and given his multiple seizures despite Keppra, she has recommended IV loading dose of  Depakote 1000 mg. Discharged with Depakote immediate release 500 mg twice daily starting tomorrow. Continue Keppra right now, although the ultimate goal would likely be to decrease off of the Keppra and continue Depakote. He will need [facility] and hopefully a neurologist as well.  CONSULTATIONS:   Dr. Thad Ranger, neurology.   Patient / Family / Caregiver informed of clinical course, medical decision-making process, and agree with plan.   I discussed return precautions, follow-up instructions, and discharged instructions with patient and/or family.   DISCHARGE INSTRUCTIONS:   You were evaluated for seizure, and given loading dose of Depakote. Continue Keppra. I am adding Depakote 500 mg twice daily. Ultimately, he would likely be transitioned off of the Keppra, but for now continue both.  Return to the emergency department for any worsening condition including seizure lasting longer than 5 minutes, trouble breathing, injury, fever, weakness, numbness, confusion or altered mental status, or any other symptoms concerning to you.  ___________________________________________   FINAL CLINICAL IMPRESSION(S) / ED DIAGNOSES   Final diagnoses:  Seizure [facility])              Note: This dictation was prepared with Dragon dictation. Any transcriptional errors that result from this process are unintentional   Governor Rooks, MD 10/07/15 2040

## 2015-10-07 NOTE — ED Notes (Signed)
Reviewed d/c instructions, prescriptions, and follow-up care with pt. Discussed that the patient should continue current dose of Keppra, (per MD order), and add new prescription of Depakote. [facility]. Pt verbalized understanding.

## 2015-10-07 NOTE — ED Notes (Signed)
Called pharmacy as to status of medication

## 2015-10-07 NOTE — ED Notes (Signed)
MD at bedside. 

## 2015-10-07 NOTE — ED Notes (Addendum)
Called pharmacy to request pt medication. Pharmacy reported they would mix and send up

## 2015-10-07 NOTE — ED Notes (Signed)
Pt reports he was told by his mother he had two seizures today. Pt denies memory of seizure. Pt c/o 10 out of 10 headache. Pt does not remember if he hit his head/fell. Pt reports he bit his lower lip

## 2015-10-07 NOTE — Discharge Instructions (Signed)
You were evaluated for seizure, and given loading dose of Depakote. Continue Keppra. I am adding Depakote 500 mg twice daily. Ultimately, he would likely be transitioned off of the Keppra, but for now continue both.  Return to the emergency department for any worsening condition including seizure lasting longer than 5 minutes, trouble breathing, injury, fever, weakness, numbness, confusion or altered mental status, or any other symptoms concerning to you.   Epilepsy Epilepsy is a disorder in which a person has repeated seizures over time. A seizure is a release of abnormal electrical activity in the brain. Seizures can cause a change in attention, behavior, or the ability to remain awake and alert (altered mental status). Seizures often involve uncontrollable shaking (convulsions).  Most people with epilepsy lead normal lives. However, people with epilepsy are at an increased risk of falls, accidents, and injuries. Therefore, it is important to begin treatment right away. CAUSES  Epilepsy has many possible causes. Anything that disturbs the normal pattern of brain cell activity can lead to seizures. This may include:   Head injury.  Birth trauma.  High fever as a child.  Stroke.  Bleeding into or around the brain.  Certain drugs.  Prolonged low oxygen, such as what occurs after CPR efforts.  Abnormal brain development.  Certain illnesses, such as meningitis, encephalitis (brain infection), malaria, and other infections.  An imbalance of nerve signaling chemicals (neurotransmitters).  SIGNS AND SYMPTOMS  The symptoms of a seizure can vary greatly from one person to another. Right before a seizure, you may have a warning (aura) that a seizure is about to occur. An aura may include the following symptoms:  Fear or anxiety.  Nausea.  Feeling like the room is spinning (vertigo).  Vision changes, such as seeing flashing lights or spots. Common symptoms during a seizure  include:  Abnormal sensations, such as an abnormal smell or a bitter taste in the mouth.   Sudden, general body stiffness.   Convulsions that involve rhythmic jerking of the face, arm, or leg on one or both sides.   Sudden change in consciousness.   Appearing to be awake but not responding.   Appearing to be asleep but cannot be awakened.   Grimacing, chewing, lip smacking, drooling, tongue biting, or loss of bowel or bladder control. After a seizure, you may feel sleepy for a while. DIAGNOSIS  Your health care provider will ask about your symptoms and take a medical history. Descriptions from any witnesses to your seizures will be very helpful in the diagnosis. A physical exam, including a detailed neurological exam, is necessary. Various tests may be done, such as:   An electroencephalogram (EEG). This is a painless test of your brain waves. In this test, a diagram is created of your brain waves. These diagrams can be interpreted by a specialist.  An MRI of the brain.   A CT scan of the brain.   A spinal tap (lumbar puncture, LP).  Blood tests to check for signs of infection or abnormal blood chemistry. TREATMENT  There is no cure for epilepsy, but it is generally treatable. Once epilepsy is diagnosed, it is important to begin treatment as soon as possible. For most people with epilepsy, seizures can be controlled with medicines. The following may also be used:  A pacemaker for the brain (vagus nerve stimulator) can be used for people with seizures that are not well controlled by medicine.  Surgery on the brain. For some people, epilepsy eventually goes away. HOME CARE INSTRUCTIONS  Follow your health care provider's recommendations on driving and safety in normal activities.  Get enough rest. Lack of sleep can cause seizures.  Only take over-the-counter or prescription medicines as directed by your health care provider. Take any prescribed medicine exactly as  directed.  Avoid any known triggers of your seizures.  Keep a seizure diary. Record what you recall about any seizure, especially any possible trigger.   Make sure the people you live and work with know that you are prone to seizures. They should receive instructions on how to help you. In general, a witness to a seizure should:   Cushion your head and body.   Turn you on your side.   Avoid unnecessarily restraining you.   Not place anything inside your mouth.   Call for emergency medical help if there is any question about what has occurred.   Follow up with your health care provider as directed. You may need regular blood tests to monitor the levels of your medicine.  SEEK MEDICAL CARE IF:   You develop signs of infection or other illness. This might increase the risk of a seizure.   You seem to be having more frequent seizures.   Your seizure pattern is changing.  SEEK IMMEDIATE MEDICAL CARE IF:   You have a seizure that does not stop after a few moments.   You have a seizure that causes any difficulty in breathing.   You have a seizure that results in a very severe headache.   You have a seizure that leaves you with the inability to speak or use a part of your body.    This information is not intended to replace advice given to you by your health care provider. Make sure you discuss any questions you have with your health care provider.   Document Released: 09/03/2005 Document Revised: 06/24/2013 Document Reviewed: 04/15/2013 Elsevier Interactive Patient Education [employer]

## 2015-10-10 ENCOUNTER — Emergency Department
Admission: EM | Admit: 2015-10-10 | Discharge: 2015-10-10 | Disposition: A | Discharge status: Home / Self Care | Payer: Medicaid Other | Attending: Emergency Medicine | Admitting: Emergency Medicine

## 2015-10-10 ENCOUNTER — Encounter: Payer: Self-pay | Admitting: Emergency Medicine

## 2015-10-10 DIAGNOSIS — F17210 Nicotine dependence, cigarettes, uncomplicated: Secondary | ICD-10-CM | POA: Insufficient documentation

## 2015-10-10 DIAGNOSIS — F329 Major depressive disorder, single episode, unspecified: Secondary | ICD-10-CM | POA: Diagnosis not present

## 2015-10-10 DIAGNOSIS — Z79899 Other long term (current) drug therapy: Secondary | ICD-10-CM | POA: Insufficient documentation

## 2015-10-10 DIAGNOSIS — G40919 Epilepsy, unspecified, intractable, without status epilepticus: Secondary | ICD-10-CM | POA: Diagnosis not present

## 2015-10-10 DIAGNOSIS — R569 Unspecified convulsions: Secondary | ICD-10-CM

## 2015-10-10 LAB — CBC
HCT: 40.4 % (ref 40.0–52.0)
Hemoglobin: 13.9 g/dL (ref 13.0–18.0)
MCH: 29.2 pg (ref 26.0–34.0)
MCHC: 34.3 g/dL (ref 32.0–36.0)
MCV: 85.1 fL (ref 80.0–100.0)
PLATELETS: 227 10*3/uL (ref 150–440)
RBC: 4.74 MIL/uL (ref 4.40–5.90)
RDW: 12.9 % (ref 11.5–14.5)
WBC: 4.6 10*3/uL (ref 3.8–10.6)

## 2015-10-10 LAB — URINALYSIS COMPLETE WITH MICROSCOPIC (ARMC ONLY)
BILIRUBIN URINE: NEGATIVE
Bacteria, UA: NONE SEEN
GLUCOSE, UA: NEGATIVE mg/dL
HGB URINE DIPSTICK: NEGATIVE
KETONES UR: NEGATIVE mg/dL
LEUKOCYTES UA: NEGATIVE
NITRITE: NEGATIVE
PROTEIN: NEGATIVE mg/dL
RBC / HPF: NONE SEEN RBC/hpf (ref 0–5)
Specific Gravity, Urine: 1.02 (ref 1.005–1.030)
pH: 6 (ref 5.0–8.0)

## 2015-10-10 LAB — BASIC METABOLIC PANEL
ANION GAP: 4 — AB (ref 5–15)
BUN: 8 mg/dL (ref 6–20)
CALCIUM: 9.2 mg/dL (ref 8.9–10.3)
CO2: 31 mmol/L (ref 22–32)
Chloride: 107 mmol/L (ref 101–111)
Creatinine, Ser: 0.99 mg/dL (ref 0.61–1.24)
GLUCOSE: 101 mg/dL — AB (ref 65–99)
POTASSIUM: 3.9 mmol/L (ref 3.5–5.1)
SODIUM: 142 mmol/L (ref 135–145)

## 2015-10-10 LAB — VALPROIC ACID LEVEL: Valproic Acid Lvl: <10 ug/mL — ABNORMAL LOW (ref 50.0–100.0)

## 2015-10-10 LAB — PHENYTOIN LEVEL, TOTAL: Phenytoin Lvl: <2.5 ug/mL — ABNORMAL LOW (ref 10.0–20.0)

## 2015-10-10 MED ORDER — VALPROATE SODIUM 500 MG/5ML IV SOLN
1000.0000 mg | Freq: Once | INTRAVENOUS | Status: AC
  Administered 2015-10-10: 1000 mg via INTRAVENOUS
  Filled 2015-10-10: qty 10

## 2015-10-10 MED ORDER — DIAZEPAM 10 MG RE GEL: 10.0000 mg | Freq: Once | RECTAL | Status: DC

## 2015-10-10 NOTE — Discharge Instructions (Signed)
Seizure, Adult A seizure is abnormal electrical activity in the brain. Seizures usually last from 30 seconds to 2 minutes. There are various types of seizures. Before a seizure, you may have a warning sensation (aura) that a seizure is about to occur. An aura may include the following symptoms:   Fear or anxiety.  Nausea.  Feeling like the room is spinning (vertigo).  Vision changes, such as seeing flashing lights or spots. Common symptoms during a seizure include:  A change in attention or behavior (altered mental status).  Convulsions with rhythmic jerking movements.  Drooling.  Rapid eye movements.  Grunting.  Loss of bladder and bowel control.  Bitter taste in the mouth.  Tongue biting. After a seizure, you may feel confused and sleepy. You may also have an injury resulting from convulsions during the seizure. HOME CARE INSTRUCTIONS   If you are given medicines, take them exactly as prescribed by your health care provider.  Keep all follow-up appointments as directed by your health care provider.  Do not swim or drive or engage in risky activity during which a seizure could cause further injury to you or others until your health care provider says it is OK.  Get adequate rest.  Teach friends and family what to do if you have a seizure. They should:  Lay you on the ground to prevent a fall.  Put a cushion under your head.  Loosen any tight clothing around your neck.  Turn you on your side. If vomiting occurs, this helps keep your airway clear.  Stay with you until you recover.  Know whether or not you need emergency care. SEEK IMMEDIATE MEDICAL CARE IF:  The seizure lasts longer than 5 minutes.  The seizure is severe or you do not wake up immediately after the seizure.  You have an altered mental status after the seizure.  You are having more frequent or worsening seizures. Someone should drive you to the emergency department or call local emergency  services (911 in [place] MAKE SURE YOU:  Understand these instructions.  Will watch your condition.  Will get help right away if you are not doing well or get worse.   This information is not intended to replace advice given to you by your health care provider. Make sure you discuss any questions you have with your health care provider.   Document Released: 08/31/2000 Document Revised: 09/24/2014 Document Reviewed: 04/15/2013 Elsevier Interactive Patient Education 2016 [employer]   Please get your prescription for Depakote filled first thing tomorrow morning and start taking the medication tomorrow morning. It is vital for you to take all of your seizure medications as prescribed to prevent further seizures. If you have a seizure that lasts for more than 5 minutes, please use the Valium is distracted.  Return to the emergency department for seizures, changes in mental status, fever or any other symptoms concerning to you.

## 2015-10-10 NOTE — ED Notes (Signed)
AAOx3.  Skin warm and dry. NAD.  Ambulates with easy and steady gait. NAD °

## 2015-10-10 NOTE — ED Provider Notes (Signed)
[facility] Emergency Department Provider Note  ____________________________________________  Time seen: Approximately 7:11 PM  I have reviewed the triage vital signs and the nursing notes.   HISTORY  Chief Complaint Seizures    HPI Keith Chung is a 35 y.o. male history of epilepsy on Depakote and Keppra presenting with seizure. Patient reports that he has had seizures for a long time, but has noticed an increase in the frequency of his seizures over the past 6 months. This is his third visit this month alone for seizures. He describes that he was home alone and began to have a headache which is a common preceding symptoms to his seizures. Afterwards, he does not remember what happened. EMS reported that the patient was having tonic-clonic activity when they arrived to the home, followed by a postictal state. Patient denies any other recent illness including cough, fever or chills, nausea, vomiting, diarrhea. He states that he's been taking his medications as prescribed.   Past Medical History  Diagnosis Date  . Seizures (HCC)     There are no active problems to display for this patient.   Past Surgical History  Procedure Laterality Date  . Vasectomy Bilateral   . Hand surgery      Current Outpatient Rx  Name  Route  Sig  Dispense  Refill  . divalproex (DEPAKOTE) 500 MG DR tablet   Oral   Take 1 tablet (500 mg total) by mouth 2 (two) times daily.   60 tablet   1   . HYDROcodone-acetaminophen (NORCO/VICODIN) 5-325 MG tablet   Oral   Take 1 tablet by mouth every 4 (four) hours as needed for moderate pain. Patient not taking: Reported on 09/23/2015   12 tablet   0   . levETIRAcetam (KEPPRA) 500 MG tablet   Oral   Take 3 tablets (1,500 mg total) by mouth 2 (two) times daily.   180 tablet   2   . pantoprazole (PROTONIX) 20 MG tablet   Oral   Take 1 tablet (20 mg total) by mouth daily.   30 tablet   1   . penicillin v potassium  (VEETID) 500 MG tablet   Oral   Take 1 tablet (500 mg total) by mouth 4 (four) times daily. Patient not taking: Reported on 08/02/2015   40 tablet   0     Allergies Pollen extract  History reviewed. No pertinent family history.  Social History Social History  Substance Use Topics  . Smoking status: Light Tobacco Smoker -- 0.30 packs/day    Types: Cigarettes    Last Attempt to Quit: 07/18/2014  . Smokeless tobacco: None  . Alcohol Use: No     Comment: socially    Review of Systems Constitutional: No fever/chills. No lightheadedness or syncope. Eyes: No visual changes. ENT: No sore throat. Cardiovascular: Denies chest pain, palpitations. Respiratory: Denies shortness of breath.  No cough. Gastrointestinal: No abdominal pain.  No nausea, no vomiting.  No diarrhea.  No constipation. Genitourinary: Negative for dysuria. Musculoskeletal: Negative for back pain. Skin: Negative for rash. Neurological: Negative for headaches, focal weakness or numbness. Positive seizure  10-point ROS otherwise negative.  ____________________________________________   PHYSICAL EXAM:  VITAL SIGNS: ED Triage Vitals  Enc Vitals Group     BP 10/10/15 1744 128/92 mmHg     Pulse Rate 10/10/15 1744 69     Resp 10/10/15 1744 16     Temp 10/10/15 1744 98.3 F (36.8 C)     Temp  Source 10/10/15 1744 Oral     SpO2 10/10/15 1744 99 %     Weight 10/10/15 1744 160 lb (72.576 kg)     Height 10/10/15 1744  (1.88 m)     Head Cir --      Peak Flow --      Pain Score 10/10/15 1749 10     Pain Loc --      Pain Edu? --      Excl. in GC? --     Constitutional: Alert and oriented. Well appearing and in no acute distress. Answer question appropriately. Eyes: Conjunctivae are normal.  EOMI. No scleral icterus. Head: Atraumatic. Nose: No congestion/rhinnorhea. Mouth/Throat: Mucous membranes are moist.  Neck: No stridor.  Supple.   Cardiovascular: Normal rate, regular rhythm. No murmurs, rubs or  gallops.  Respiratory: Normal respiratory effort.  No retractions. Lungs CTAB.  No wheezes, rales or ronchi. Gastrointestinal: Soft and nontender. No distention. No peritoneal signs. Musculoskeletal: No LE edema.  Neurologic:  Normal speech and language. No gross focal neurologic deficits are appreciated. Moves all extremities well. Skin:  Skin is warm, dry and intact. No rash noted. Psychiatric: Depressed mood and flat affect. Speech and behavior are normal.  Normal judgement.  ____________________________________________   LABS (all labs ordered are listed, but only abnormal results are displayed)  Labs Reviewed  BASIC METABOLIC PANEL - Abnormal; Notable for the following:    Glucose, Bld 101 (*)    Anion gap 4 (*)    All other components within normal limits  URINALYSIS COMPLETEWITH MICROSCOPIC ([facility] ONLY) - Abnormal; Notable for the following:    Color, Urine YELLOW (*)    APPearance CLEAR (*)    Squamous Epithelial / LPF 0-5 (*)    All other components within normal limits  VALPROIC ACID LEVEL - Abnormal; Notable for the following:    Valproic Acid Lvl <10 (*)    All other components within normal limits  PHENYTOIN LEVEL, TOTAL - Abnormal; Notable for the following:    Phenytoin Lvl <2.5 (*)    All other components within normal limits  CBC   ____________________________________________  EKG  ED ECG REPORT I, Rockne Menghini, the attending physician, personally viewed and interpreted this ECG.   Date: 10/10/2015  EKG Time: 1744   Rate: 66  Rhythm: normal sinus rhythm  Axis: Normal  Intervals:none  ST&T Change: No ST elevation.  ____________________________________________  RADIOLOGY  No results found.  ____________________________________________   PROCEDURES  Procedure(s) performed: None  Critical Care performed: No ____________________________________________   INITIAL IMPRESSION / ASSESSMENT AND PLAN / ED COURSE  Pertinent labs & imaging  results that were available during my care of the patient were reviewed by me and considered in my medical decision making (see chart for details).  35 y.o. M w/ hx of epilepsy and frequent breakthrough seizures here with typical seizure which self-resolved, back to normal mental status.  I will do basic labs and a urinalysis, as well as Depakote level. I have spoken with Dr. Thad Ranger, who recommends increasing Depakote if he is low. The patient will need outpatient neurologic follow-up, or at the very least [facility]. I will also plan to discharge him home with rectal suppositories of Valium if he has refractory seizures.  ----------------------------------------- 8:58 PM on 10/10/2015 -----------------------------------------  I have spoken with the patient whose mother is now in the room and both of them now do  say that she never filled his prescription for Depakote "because I had other things I had to take care of." I will re-load the patient on Depakote IV, and then I have made it clear that it is imperative that they had that medication filled. They will plan to follow up with Dr. Clelia Croft and take seizure precautions. Discharge instructions and follow-up instructions as well as return precautions were discussed.  ____________________________________________  FINAL CLINICAL IMPRESSION(S) / ED DIAGNOSES  Final diagnoses:  Seizure (HCC)  Intractable epilepsy without status epilepticus, unspecified epilepsy type (HCC)      NEW MEDICATIONS STARTED DURING THIS VISIT:  New Prescriptions   No medications on file     Rockne Menghini, MD 10/10/15 2100

## 2015-10-10 NOTE — ED Notes (Signed)
Pt to ED via EMS from home c/o seizure today.  Per EMS patient was found on the floor in house convulsing.  EMS states patient would not respond verbally but opened eyes immediately with sternal rub.  Upon arrival patient is alert, unable to recall what happened and answering with appropriate nods.  Pt states taking seizure medication as prescribed.

## 2015-10-16 ENCOUNTER — Emergency Department: Payer: Medicaid Other

## 2015-10-16 ENCOUNTER — Inpatient Hospital Stay
Admission: EM | Admit: 2015-10-16 | Discharge: 2015-10-18 | DRG: 918 | Disposition: A | Discharge status: Home / Self Care | Payer: Medicaid Other | Attending: Internal Medicine | Admitting: Internal Medicine

## 2015-10-16 DIAGNOSIS — T426X4A Poisoning by other antiepileptic and sedative-hypnotic drugs, undetermined, initial encounter: Secondary | ICD-10-CM

## 2015-10-16 DIAGNOSIS — R410 Disorientation, unspecified: Secondary | ICD-10-CM | POA: Diagnosis present

## 2015-10-16 DIAGNOSIS — Y9259 Other trade areas as the place of occurrence of the external cause: Secondary | ICD-10-CM

## 2015-10-16 DIAGNOSIS — Z833 Family history of diabetes mellitus: Secondary | ICD-10-CM

## 2015-10-16 DIAGNOSIS — F17210 Nicotine dependence, cigarettes, uncomplicated: Secondary | ICD-10-CM | POA: Diagnosis present

## 2015-10-16 DIAGNOSIS — F1290 Cannabis use, unspecified, uncomplicated: Secondary | ICD-10-CM | POA: Diagnosis present

## 2015-10-16 DIAGNOSIS — G40909 Epilepsy, unspecified, not intractable, without status epilepticus: Secondary | ICD-10-CM | POA: Diagnosis present

## 2015-10-16 DIAGNOSIS — Z8249 Family history of ischemic heart disease and other diseases of the circulatory system: Secondary | ICD-10-CM | POA: Diagnosis not present

## 2015-10-16 DIAGNOSIS — T426X1A Poisoning by other antiepileptic and sedative-hypnotic drugs, accidental (unintentional), initial encounter: Principal | ICD-10-CM | POA: Diagnosis present

## 2015-10-16 DIAGNOSIS — R569 Unspecified convulsions: Secondary | ICD-10-CM

## 2015-10-16 DIAGNOSIS — T50901A Poisoning by unspecified drugs, medicaments and biological substances, accidental (unintentional), initial encounter: Secondary | ICD-10-CM | POA: Diagnosis present

## 2015-10-16 LAB — BASIC METABOLIC PANEL
ANION GAP: 8 (ref 5–15)
ANION GAP: 7 (ref 5–15)
BUN: 9 mg/dL (ref 6–20)
BUN: 10 mg/dL (ref 6–20)
CALCIUM: 9.6 mg/dL (ref 8.9–10.3)
CALCIUM: 8.5 mg/dL — AB (ref 8.9–10.3)
CHLORIDE: 106 mmol/L (ref 101–111)
CO2: 31 mmol/L (ref 22–32)
CO2: 27 mmol/L (ref 22–32)
CREATININE: 1.11 mg/dL (ref 0.61–1.24)
CREATININE: 1.14 mg/dL (ref 0.61–1.24)
Chloride: 102 mmol/L (ref 101–111)
GFR calc Af Amer: >60 mL/min (ref >60)
GFR calc non Af Amer: >60 mL/min (ref >60)
GLUCOSE: 94 mg/dL (ref 65–99)
GLUCOSE: 117 mg/dL — AB (ref 65–99)
Potassium: 4.5 mmol/L (ref 3.5–5.1)
Potassium: 4 mmol/L (ref 3.5–5.1)
Sodium: 141 mmol/L (ref 135–145)
Sodium: 140 mmol/L (ref 135–145)

## 2015-10-16 LAB — CBC WITH DIFFERENTIAL/PLATELET
BASOS ABS: 0 10*3/uL (ref 0–0.1)
Basophils Relative: 1 %
EOS PCT: 5 %
Eosinophils Absolute: 0.2 10*3/uL (ref 0–0.7)
HCT: 44.9 % (ref 40.0–52.0)
Hemoglobin: 15.2 g/dL (ref 13.0–18.0)
LYMPHS PCT: 44 %
Lymphs Abs: 1.6 10*3/uL (ref 1.0–3.6)
MCH: 28.7 pg (ref 26.0–34.0)
MCHC: 33.8 g/dL (ref 32.0–36.0)
MCV: 84.7 fL (ref 80.0–100.0)
MONO ABS: 0.3 10*3/uL (ref 0.2–1.0)
MONOS PCT: 9 %
Neutro Abs: 1.5 10*3/uL (ref 1.4–6.5)
Neutrophils Relative %: 41 %
PLATELETS: 249 10*3/uL (ref 150–440)
RBC: 5.3 MIL/uL (ref 4.40–5.90)
RDW: 12.5 % (ref 11.5–14.5)
WBC: 3.7 10*3/uL — ABNORMAL LOW (ref 3.8–10.6)

## 2015-10-16 LAB — URINALYSIS COMPLETE WITH MICROSCOPIC (ARMC ONLY)
BACTERIA UA: NONE SEEN
BILIRUBIN URINE: NEGATIVE
GLUCOSE, UA: NEGATIVE mg/dL
HGB URINE DIPSTICK: NEGATIVE
Leukocytes, UA: NEGATIVE
Nitrite: NEGATIVE
Protein, ur: NEGATIVE mg/dL
SQUAMOUS EPITHELIAL / LPF: NONE SEEN
Specific Gravity, Urine: 1.024 (ref 1.005–1.030)
pH: 6 (ref 5.0–8.0)

## 2015-10-16 LAB — URINE DRUG SCREEN, QUALITATIVE (ARMC ONLY)
Amphetamines, Ur Screen: NOT DETECTED
BARBITURATES, UR SCREEN: NOT DETECTED
Benzodiazepine, Ur Scrn: NOT DETECTED
CANNABINOID 50 NG, UR ~~LOC~~: NOT DETECTED
COCAINE METABOLITE, UR ~~LOC~~: NOT DETECTED
MDMA (ECSTASY) UR SCREEN: NOT DETECTED
Methadone Scn, Ur: NOT DETECTED
OPIATE, UR SCREEN: NOT DETECTED
PHENCYCLIDINE (PCP) UR S: NOT DETECTED
Tricyclic, Ur Screen: NOT DETECTED

## 2015-10-16 LAB — ETHANOL: Alcohol, Ethyl (B): <5 mg/dL (ref <5)

## 2015-10-16 LAB — VALPROIC ACID LEVEL
Valproic Acid Lvl: 188 ug/mL (ref 50.0–100.0)
Valproic Acid Lvl: 191 ug/mL (ref 50.0–100.0)

## 2015-10-16 LAB — GLUCOSE, CAPILLARY: GLUCOSE-CAPILLARY: 63 mg/dL — AB (ref 65–99)

## 2015-10-16 LAB — ACETAMINOPHEN LEVEL

## 2015-10-16 LAB — AMMONIA: AMMONIA: 78 umol/L — AB (ref 9–35)

## 2015-10-16 LAB — SALICYLATE LEVEL

## 2015-10-16 MED ORDER — ACETAMINOPHEN 650 MG RE SUPP: 650.0000 mg | Freq: Four times a day (QID) | RECTAL | Status: DC | PRN

## 2015-10-16 MED ORDER — LEVETIRACETAM 750 MG PO TABS
1500.0000 mg | ORAL_TABLET | Freq: Two times a day (BID) | ORAL | Status: DC
  Administered 2015-10-17 – 2015-10-18 (×2): 1500 mg via ORAL
  Filled 2015-10-16 (×4): qty 2

## 2015-10-16 MED ORDER — SODIUM CHLORIDE 0.9 % IV SOLN
INTRAVENOUS | Status: DC
  Administered 2015-10-16 – 2015-10-18 (×3): via INTRAVENOUS

## 2015-10-16 MED ORDER — SODIUM CHLORIDE 0.9% FLUSH
3.0000 mL | Freq: Two times a day (BID) | INTRAVENOUS | Status: DC
  Administered 2015-10-16: 3 mL via INTRAVENOUS

## 2015-10-16 MED ORDER — ONDANSETRON HCL 4 MG PO TABS: 4.0000 mg | ORAL_TABLET | Freq: Four times a day (QID) | ORAL | Status: DC | PRN

## 2015-10-16 MED ORDER — SODIUM CHLORIDE 0.9 % IV BOLUS (SEPSIS)
1000.0000 mL | Freq: Once | INTRAVENOUS | Status: AC
  Administered 2015-10-16: 1000 mL via INTRAVENOUS

## 2015-10-16 MED ORDER — ACETAMINOPHEN 325 MG PO TABS
650.0000 mg | ORAL_TABLET | Freq: Four times a day (QID) | ORAL | Status: DC | PRN
  Administered 2015-10-16 – 2015-10-18 (×3): 650 mg via ORAL
  Filled 2015-10-16 (×3): qty 2

## 2015-10-16 MED ORDER — PANTOPRAZOLE SODIUM 40 MG PO TBEC
40.0000 mg | DELAYED_RELEASE_TABLET | Freq: Every day | ORAL | Status: DC
  Administered 2015-10-17 – 2015-10-18 (×2): 40 mg via ORAL
  Filled 2015-10-16 (×2): qty 1

## 2015-10-16 MED ORDER — ENOXAPARIN SODIUM 40 MG/0.4ML ~~LOC~~ SOLN
40.0000 mg | SUBCUTANEOUS | Status: DC
  Administered 2015-10-16 – 2015-10-17 (×2): 40 mg via SUBCUTANEOUS
  Filled 2015-10-16 (×2): qty 0.4

## 2015-10-16 MED ORDER — ONDANSETRON HCL 4 MG/2ML IJ SOLN: 4.0000 mg | Freq: Four times a day (QID) | INTRAMUSCULAR | Status: DC | PRN

## 2015-10-16 NOTE — ED Notes (Signed)
Patient transported to CT 

## 2015-10-16 NOTE — ED Provider Notes (Signed)
[facility] Emergency Department Provider Note  ____________________________________________  Time seen: Seen upon arrival to the emergency department  I have reviewed the triage vital signs and the nursing notes.   HISTORY  Chief Complaint Seizures    HPI Keith Chung is a 35 y.o. male with a history of seizures and frequent emergency Department visits who is presenting today via EMS for a overdose versus seizure. EMS said that they were dispatched to a [employer] for an overdose. When they arrived they found the patient seemingly unconscious but then was easily arousable to tactile stimuli. Since being with the EMS crew the patient has complained of a headache but denies any suicide attempt. He says that it is typical for him to have a headache after seizures. He says that the headache is a "12 out of 10." He says it feels like a stabbing pain. It is diffuse. He says the seventh headache is typical for after seizure. He is unsure if he is compliant with his medications.He is also saying his legs feel numb and weak bilaterally which also happens to him after his seizures.   Past Medical History  Diagnosis Date  . Seizures (HCC)     There are no active problems to display for this patient.   Past Surgical History  Procedure Laterality Date  . Vasectomy Bilateral   . Hand surgery      Current Outpatient Rx  Name  Route  Sig  Dispense  Refill  . diazepam (DIASTAT ACUDIAL) 10 MG GEL   Rectal   Place 10 mg rectally once.   1 Package   0   . divalproex (DEPAKOTE) 500 MG DR tablet   Oral   Take 1 tablet (500 mg total) by mouth 2 (two) times daily.   60 tablet   1   . HYDROcodone-acetaminophen (NORCO/VICODIN) 5-325 MG tablet   Oral   Take 1 tablet by mouth every 4 (four) hours as needed for moderate pain. Patient not taking: Reported on 09/23/2015   12 tablet   0   . levETIRAcetam (KEPPRA) 500 MG tablet   Oral   Take 3 tablets (1,500 mg total)  by mouth 2 (two) times daily.   180 tablet   2   . pantoprazole (PROTONIX) 20 MG tablet   Oral   Take 1 tablet (20 mg total) by mouth daily.   30 tablet   1   . penicillin v potassium (VEETID) 500 MG tablet   Oral   Take 1 tablet (500 mg total) by mouth 4 (four) times daily. Patient not taking: Reported on 08/02/2015   40 tablet   0     Allergies Pollen extract  No family history on file.  Social History Social History  Substance Use Topics  . Smoking status: Light Tobacco Smoker -- 0.30 packs/day    Types: Cigarettes    Last Attempt to Quit: 07/18/2014  . Smokeless tobacco: None  . Alcohol Use: No     Comment: socially    Review of Systems Constitutional: No fever/chills Eyes: No visual changes. ENT: No sore throat. Cardiovascular: Denies chest pain. Respiratory: Denies shortness of breath. Gastrointestinal: No abdominal pain.  No nausea, no vomiting.  No diarrhea.  No constipation. Genitourinary: Negative for dysuria. Musculoskeletal: Negative for back pain. Skin: Negative for rash. Neurological: Negative for  focal weakness 10-point ROS otherwise negative.  ____________________________________________   PHYSICAL EXAM:  VITAL SIGNS: ED Triage Vitals  Enc Vitals Group  BP --      Pulse --      Resp --      Temp --      Temp src --      SpO2 --      Weight --      Height --      Head Cir --      Peak Flow --      Pain Score 10/16/15 1309 3     Pain Loc --      Pain Edu? --      Excl. in GC? --     Constitutional: Alert and oriented.  in no acute distress. Possibly postictal Eyes: Conjunctivae are normal. PERRL. EOMI. Head: Atraumatic. Nose: No congestion/rhinnorhea. Mouth/Throat: Mucous membranes are moist.  No tongue bite. Neck: No stridor.   Cardiovascular: Normal rate, regular rhythm. Grossly normal heart sounds.  Good peripheral circulation. Respiratory: Normal respiratory effort.  No retractions. Lungs CTAB. Gastrointestinal:  Soft and nontender. No distention. No abdominal bruits. No CVA tenderness. Musculoskeletal: No lower extremity tenderness nor edema.  No joint effusions. Neurologic:  Normal speech and language. 3 out of 5 strength to the bilateral lower extremities with decreased sensation to light touch..  Skin:  Skin is warm, dry and intact. No rash noted. Psychiatric: Mood and affect are normal. Speech and behavior are normal.  ____________________________________________   LABS (all labs ordered are listed, but only abnormal results are displayed)  Labs Reviewed  GLUCOSE, CAPILLARY - Abnormal; Notable for the following:    Glucose-Capillary 63 (*)    All other components within normal limits  CBC WITH DIFFERENTIAL/PLATELET - Abnormal; Notable for the following:    WBC 3.7 (*)    All other components within normal limits  VALPROIC ACID LEVEL - Abnormal; Notable for the following:    Valproic Acid Lvl 188 (*)    All other components within normal limits  URINALYSIS COMPLETEWITH MICROSCOPIC ([facility] ONLY) - Abnormal; Notable for the following:    Color, Urine YELLOW (*)    APPearance CLEAR (*)    Ketones, ur 1+ (*)    All other components within normal limits  ACETAMINOPHEN LEVEL - Abnormal; Notable for the following:    Acetaminophen (Tylenol), Serum <10 (*)    All other components within normal limits  BASIC METABOLIC PANEL  ETHANOL  SALICYLATE LEVEL  URINE DRUG SCREEN, QUALITATIVE ([facility] ONLY)  AMMONIA   ____________________________________________  EKG  ED ECG REPORT I, Arelia Longest, the attending physician, personally viewed and interpreted this ECG.   Date: 10/16/2015  EKG Time: 1319  Rate: 67  Rhythm: normal sinus rhythm  Axis: Normal axis  Intervals: Normal  ST&T Change: RSR prime in V1 and V2 which is likely normal variant. ST elevation throughout likely consistent with benign early repolarization.  ____________________________________________  RADIOLOGY  CAT scan  of the brain is normal. ____________________________________________   PROCEDURES   ____________________________________________   INITIAL IMPRESSION / ASSESSMENT AND PLAN / ED COURSE  Pertinent labs & imaging results that were available during my care of the patient were reviewed by me and considered in my medical decision making (see chart for details).  ----------------------------------------- 250 PM on 10/16/2015 -----------------------------------------  Discussed case with the [facility] [place], who recommends a repeat Depakote level as well as ammonia and electrolytes in 4-6 hours from now. Cautioned to monitor for tachycardia and/or hypotension.  ----------------------------------------- 3:25 PM on 10/16/2015 -----------------------------------------  Patient at this time is much more lucid and  can return for the details prior to arrival. However, when asked if he took much of his Depakote he denies. He has no idea how his Depakote level could be elevated. He is denying any suicidal or homicidal ideation at this time. Has full strength throughout this time without any sensory deficits.  The patient were made for admission to the hospital. He understands the plan is going to comply. Will consult psychiatry. Signed out to Dr. Nemiah Commander. Possible that the patient's initial altered mental status was related to Depakote or postictal period. ____________________________________________   FINAL CLINICAL IMPRESSION(S) / ED DIAGNOSES  Depakote overdose    Myrna Blazer, MD 10/16/15 1529

## 2015-10-16 NOTE — H&P (Signed)
[facility] [place] [facility]   PATIENT NAME: Keith Chung    MR#:  [id number]  DATE OF BIRTH:  12/03/1980  DATE OF ADMISSION:  10/16/2015  PRIMARY CARE PHYSICIAN: [facility]  REQUESTING/REFERRING PHYSICIAN: Dr. Gladstone Pih  CHIEF COMPLAINT:   Chief Complaint  Patient presents with  . Seizures    HISTORY OF PRESENT ILLNESS:  Keith Chung  is a 35 y.o. male with a known history of seizure disorder since he was a younger kid presents to the hospital after being found unresponsive and noted to have elevated depakote level. Patient was supposed to be on depakote and keppra at home for his seizures. Has had multiple ER vistst here at [facility], [facility] and [facility] for recurrent seizures. He actually did not have any depakote until 5 days ago when he was in the ER and was given a new prescription by ER physician for  BID and he got it filled on 10/14/15. Denies any intentional overdose. He says he woke up today and took his seizure meds as directed, later felt like he was going to have a seizure, didn't feel well and called EMS. When EMS arrived, he was lethargic. Denies any overdose. Patient is living in a motel now. No witnessed seizures. He is noted to have elevated depakote level of 188 in ER today.  PAST MEDICAL HISTORY:   Past Medical History  Diagnosis Date  . Seizures (HCC)     PAST SURGICAL HISTORY:   Past Surgical History  Procedure Laterality Date  . Vasectomy Bilateral   . Hand surgery      right hand surgery    SOCIAL HISTORY:   Social History  Substance Use Topics  . Smoking status: Light Tobacco Smoker -- 0.30 packs/day    Types: Cigarettes    Last Attempt to Quit: 07/18/2014  . Smokeless tobacco: Not on file     Comment: stopped 4 months ago  . Alcohol Use: No     Comment: socially    FAMILY HISTORY:   Family History  Problem Relation Age of Onset  . Hypertension Mother   . Diabetes Mellitus II Maternal  Grandmother     DRUG ALLERGIES:   Allergies  Allergen Reactions  . Pollen Extract Other (See Comments)    Runny nose, watery eyes, sneezing    REVIEW OF SYSTEMS:   Review of Systems  Constitutional: Negative for fever, chills, weight loss and malaise/fatigue.  HENT: Negative for ear discharge, ear pain, hearing loss, nosebleeds and tinnitus.   Eyes: Negative for blurred vision, double vision and photophobia.  Respiratory: Negative for cough, hemoptysis, shortness of breath and wheezing.   Cardiovascular: Negative for chest pain, palpitations, orthopnea and leg swelling.  Gastrointestinal: Negative for heartburn, nausea, vomiting, abdominal pain, diarrhea, constipation and melena.  Genitourinary: Negative for dysuria, urgency, frequency and hematuria.  Musculoskeletal: Negative for myalgias, back pain and neck pain.  Skin: Negative for rash.  Neurological: Positive for sensory change, seizures and headaches. Negative for dizziness, tingling, tremors, speech change and focal weakness.  Endo/Heme/Allergies: Does not bruise/bleed easily.  Psychiatric/Behavioral: Negative for depression.    MEDICATIONS AT HOME:   Prior to Admission medications   Medication Sig Start Date End Date Taking? Authorizing Provider  divalproex (DEPAKOTE) 500 MG DR tablet Take 1 tablet (500 mg total) by mouth 2 (two) times daily. 10/07/15  Yes Governor Rooks, MD  levETIRAcetam (KEPPRA) 500 MG tablet Take 3 tablets (1,500 mg total) by mouth 2 (two)  times daily. 10/01/15  Yes Emily Filbert, MD  pantoprazole (PROTONIX) 20 MG tablet Take 1 tablet (20 mg total) by mouth daily. 09/23/15 09/22/16 Yes Arnaldo Natal, MD  diazepam (DIASTAT ACUDIAL) 10 MG GEL Place 10 mg rectally once. 10/10/15   Rockne Menghini, MD  HYDROcodone-acetaminophen (NORCO/VICODIN) 5-325 MG tablet Take 1 tablet by mouth every 4 (four) hours as needed for moderate pain. Patient not taking: Reported on 09/23/2015 08/02/15   Minna Antis,  MD  penicillin v potassium (VEETID) 500 MG tablet Take 1 tablet (500 mg total) by mouth 4 (four) times daily. Patient not taking: Reported on 08/02/2015 05/11/15   Trixie Dredge, PA-C      VITAL SIGNS:  Blood pressure 129/87, temperature 98.3 F (36.8 C).  PHYSICAL EXAMINATION:   Physical Exam  GENERAL:  35 y.o.-year-old patient lying in the bed with no acute distress.  EYES: Pupils equal, round, reactive to light and accommodation. No scleral icterus. Extraocular muscles intact.  HEENT: Head atraumatic, normocephalic. Oropharynx and nasopharynx clear. No lip or tongue laceration seen. NECK:  Supple, no jugular venous distention. No thyroid enlargement, no tenderness.  LUNGS: Normal breath sounds bilaterally, no wheezing, rales,rhonchi or crepitation. No use of accessory muscles of respiration.  CARDIOVASCULAR: S1, S2 normal. No murmurs, rubs, or gallops.  ABDOMEN: Soft, nontender, nondistended. Bowel sounds present. No organomegaly or mass.  EXTREMITIES: No pedal edema, cyanosis, or clubbing.  NEUROLOGIC: Cranial nerves II through XII are intact. Muscle strength 5/5 in all extremities. Sensation intact. Gait not checked.  PSYCHIATRIC: The patient is alert and oriented x 3.  SKIN: No obvious rash, lesion, or ulcer.   LABORATORY PANEL:   CBC  Recent Labs Lab 10/16/15 1318  WBC 3.7*  HGB 15.2  HCT 44.9  PLT 249   ------------------------------------------------------------------------------------------------------------------  Chemistries   Recent Labs Lab 10/16/15 1318  NA 141  K 4.5  CL 102  CO2 31  GLUCOSE 94  BUN 9  CREATININE 1.11  CALCIUM 9.6   ------------------------------------------------------------------------------------------------------------------  Cardiac Enzymes No results for input(s): TROPONINI in the last 168 hours. ------------------------------------------------------------------------------------------------------------------  RADIOLOGY:   Ct Head Wo Contrast  10/16/2015  CLINICAL DATA:  Overdose. Unresponsive on arrival. Possible seizure. EXAM: CT HEAD WITHOUT CONTRAST TECHNIQUE: Contiguous axial images were obtained from the base of the skull through the vertex without intravenous contrast. COMPARISON:  09/25/2015. FINDINGS: There is no evidence for acute hemorrhage, hydrocephalus, mass lesion, or abnormal extra-axial fluid collection. No definite CT evidence for acute infarction. The visualized paranasal sinuses and mastoid air cells are clear. IMPRESSION: Stable.  Normal exam. Electronically Signed   By: Kennith Center M.D.   On: 10/16/2015 13:36    EKG:   Orders placed or performed during the hospital encounter of 10/16/15  . ED EKG  . ED EKG  . EKG 12-Lead  . EKG 12-Lead  . EKG 12-Lead  . EKG 12-Lead    IMPRESSION AND PLAN:   Keith Chung  is a 35 y.o. male with a known history of seizure disorder since he was a younger kid presents to the hospital after being found unresponsive and noted to have elevated depakote level.  #1 Elevated Depakote level- denies any overdose-  Denies increased accidental ingestion as well. - Hold depakote, psych consult pending - cont sitter until then,. - neuro checks, poison center was contacted, IV fluids, repeat depakote levels in 6-8 hrs - CT head without acute findings - monitor on tele  #2 Seizure disorder- neuro consulted, hold depakote -  cont keppra - outpatient neurology follow up- set up- but hasn't seen a neurologist yet - neuro checks, seizure precautions  #3 Tobacco use disorder- says that he hasn't been smoking for 4 months now  #4 DVT Prophylaxis- lovenox    All the records are reviewed and case discussed with ED provider. Management plans discussed with the patient, family and they are in agreement.  CODE STATUS: Full Code  TOTAL TIME TAKING CARE OF THIS PATIENT: 50 minutes.    Enid Baas M.D on 10/16/2015 at 4:03 PM  Between 7am to 6pm - Pager  - [phone]  After 6pm go to [email or web address] - password [email or web address]  [facility]  Office  [phone]  CC: Primary care physician; Pcp Not In System

## 2015-10-16 NOTE — ED Notes (Signed)
Pt via EMS called for overdose, pt unresponsive upon arrival. Poss seizure, pt cannot remember what happened, hx of epilepsy

## 2015-10-17 ENCOUNTER — Inpatient Hospital Stay: Payer: Medicaid Other

## 2015-10-17 DIAGNOSIS — R410 Disorientation, unspecified: Secondary | ICD-10-CM | POA: Diagnosis present

## 2015-10-17 DIAGNOSIS — R569 Unspecified convulsions: Secondary | ICD-10-CM

## 2015-10-17 DIAGNOSIS — T50904A Poisoning by unspecified drugs, medicaments and biological substances, undetermined, initial encounter: Secondary | ICD-10-CM

## 2015-10-17 DIAGNOSIS — G40909 Epilepsy, unspecified, not intractable, without status epilepticus: Secondary | ICD-10-CM

## 2015-10-17 LAB — CBC
HCT: 38.9 % — ABNORMAL LOW (ref 40.0–52.0)
Hemoglobin: 13.3 g/dL (ref 13.0–18.0)
MCH: 28.7 pg (ref 26.0–34.0)
MCHC: 34.1 g/dL (ref 32.0–36.0)
MCV: 84.2 fL (ref 80.0–100.0)
PLATELETS: 209 10*3/uL (ref 150–440)
RBC: 4.62 MIL/uL (ref 4.40–5.90)
RDW: 12.6 % (ref 11.5–14.5)
WBC: 2.2 10*3/uL — AB (ref 3.8–10.6)

## 2015-10-17 LAB — BASIC METABOLIC PANEL
Anion gap: 7 (ref 5–15)
BUN: 12 mg/dL (ref 6–20)
CALCIUM: 8.4 mg/dL — AB (ref 8.9–10.3)
CHLORIDE: 108 mmol/L (ref 101–111)
CO2: 26 mmol/L (ref 22–32)
CREATININE: 1.05 mg/dL (ref 0.61–1.24)
GFR calc non Af Amer: >60 mL/min (ref >60)
Glucose, Bld: 100 mg/dL — ABNORMAL HIGH (ref 65–99)
Potassium: 4.2 mmol/L (ref 3.5–5.1)
SODIUM: 141 mmol/L (ref 135–145)

## 2015-10-17 LAB — VALPROIC ACID LEVEL: VALPROIC ACID LVL: 103 ug/mL — AB (ref 50.0–100.0)

## 2015-10-17 MED ORDER — SODIUM CHLORIDE 0.9% FLUSH: 3.0000 mL | INTRAVENOUS | Status: DC | PRN

## 2015-10-17 NOTE — Consult Note (Signed)
Reason for Consult:Seizure Referring Physician: Elisabeth Pigeon  CC: Seizure  HPI: Keith Chung is an 35 y.o. male with a known history of seizure since childhood who is maintained on Keppra and Depakote.  Patient obtains medications from the clinic at this time.  On review of the chart patient has been on Keppra in the past, had his dose increased and remained poorly controlled at  BID.  Level on 09/23/2015 was 63.8.  Therefore it appears that the patient was compliant.  On 1/20 visit to the ED patient was started on Depakote at  BID after being given a loading dose.  On 1/23 patient seen again in the ED after having a seizure.  Patient reported being compliant with medications but level at that time was <10.  Now patient reports only getting Depakote filled in the past 2-3 days.  On yesterday reports waking up and taking his seizure medications and later feeling as if her was going to have a seizure.  He called EMS and when they arrived patient was lethargic.  No seizure activity was witnessed.  Depakote level on presentation was 188.    Past Medical History  Diagnosis Date  . Seizures [facility])     Past Surgical History  Procedure Laterality Date  . Vasectomy Bilateral   . Hand surgery      right hand surgery    Family History  Problem Relation Age of Onset  . Hypertension Mother   . Diabetes Mellitus II Maternal Grandmother     Social History:  reports that he has been smoking Cigarettes.  He has been smoking about 0.30 packs per day. He does not have any smokeless tobacco history on file. He reports that he uses illicit drugs (Marijuana). He reports that he does not drink alcohol.  Allergies  Allergen Reactions  . Pollen Extract Other (See Comments)    Runny nose, watery eyes, sneezing    Medications:  I have reviewed the patient's current medications. Prior to Admission:  Prescriptions prior to admission  Medication Sig Dispense Refill Last Dose  . divalproex (DEPAKOTE)  500 MG DR tablet Take 1 tablet (500 mg total) by mouth 2 (two) times daily. 60 tablet 1 10/16/2015 at Unknown time  . levETIRAcetam (KEPPRA) 500 MG tablet Take 3 tablets (1,500 mg total) by mouth 2 (two) times daily. 180 tablet 2 10/16/2015 at Unknown time  . pantoprazole (PROTONIX) 20 MG tablet Take 1 tablet (20 mg total) by mouth daily. 30 tablet 1 10/16/2015 at Unknown time  . diazepam (DIASTAT ACUDIAL) 10 MG GEL Place 10 mg rectally once. 1 Package 0   . HYDROcodone-acetaminophen (NORCO/VICODIN) 5-325 MG tablet Take 1 tablet by mouth every 4 (four) hours as needed for moderate pain. (Patient not taking: Reported on 09/23/2015) 12 tablet 0 Completed Course at Unknown time  . penicillin v potassium (VEETID) 500 MG tablet Take 1 tablet (500 mg total) by mouth 4 (four) times daily. (Patient not taking: Reported on 08/02/2015) 40 tablet 0 Completed Course at Unknown time   Scheduled: . enoxaparin (LOVENOX) injection  40 mg Subcutaneous Q24H  . levETIRAcetam  1,500 mg Oral BID  . pantoprazole  40 mg Oral Daily  . sodium chloride flush  3 mL Intravenous Q12H    ROS: History obtained from the patient  General ROS: negative for - chills, fatigue, fever, night sweats, weight gain or weight loss Psychological ROS: negative for - behavioral disorder, hallucinations, memory difficulties, mood swings or suicidal ideation Ophthalmic ROS: negative for -  blurry vision, double vision, eye pain or loss of vision ENT ROS: negative for - epistaxis, nasal discharge, oral lesions, sore throat, tinnitus or vertigo Allergy and Immunology ROS: negative for - hives or itchy/watery eyes Hematological and Lymphatic ROS: negative for - bleeding problems, bruising or swollen lymph nodes Endocrine ROS: negative for - galactorrhea, hair pattern changes, polydipsia/polyuria or temperature intolerance Respiratory ROS: negative for - cough, hemoptysis, shortness of breath or wheezing Cardiovascular ROS: negative for - chest  pain, dyspnea on exertion, edema or irregular heartbeat Gastrointestinal ROS: negative for - abdominal pain, diarrhea, hematemesis, nausea/vomiting or stool incontinence Genito-Urinary ROS: negative for - dysuria, hematuria, incontinence or urinary frequency/urgency Musculoskeletal ROS: muscle soreness Neurological ROS: as noted in HPI, headache Dermatological ROS: negative for rash and skin lesion changes  Physical Examination: Blood pressure 114/71, pulse 72, temperature 97.5 F (36.4 C), temperature source Oral, resp. rate 18, height  (1.88 m), weight 67.903 kg (149 lb 11.2 oz), SpO2 100 %.  HEENT-  Normocephalic, no lesions, without obvious abnormality.  Normal external eye and conjunctiva.  Normal TM's bilaterally.  Normal auditory canals and external ears. Normal external nose, mucus membranes and septum.  Normal pharynx. Cardiovascular- S1, S2 normal, pulses palpable throughout   Lungs- chest clear, no wheezing, rales, normal symmetric air entry Abdomen- soft, non-tender; bowel sounds normal; no masses,  no organomegaly Extremities- no edema Lymph-no adenopathy palpable Musculoskeletal-no joint tenderness, deformity or swelling Skin-warm and dry, no hyperpigmentation, vitiligo, or suspicious lesions  Neurological Examination Mental Status: Affect flat,  Initially would not speak to me but eventually began to give history which is not consistent with what is in the chart.  Oriented, thought content appropriate.  Speech fluent without evidence of aphasia.  Able to follow 3 step commands without difficulty. Cranial Nerves: II: Discs flat bilaterally; Visual fields grossly normal, pupils equal, round, reactive to light and accommodation III,IV, VI: ptosis not present, extra-ocular motions intact bilaterally V,VII: smile symmetric, facial light touch sensation normal bilaterally VIII: hearing normal bilaterally IX,X: gag reflex present XI: bilateral shoulder shrug XII: midline  tongue extension Motor: Gave very little effort but able to lift all extremities against gravity without any focal weakness noted.    Sensory: Pinprick and light touch intact throughout, bilaterally Deep Tendon Reflexes: 2+ in the upper extremities, trace in the lower extremities.   Plantars: Right: mute   Left: mute Cerebellar: normal finger-to-nose and normal heel-to-shin testing bilaterally Gait: not tested due to safety concerns      Laboratory Studies:   Basic Metabolic Panel:  Recent Labs Lab 10/10/15 1804 10/16/15 1318 10/16/15 2153 10/17/15 0406  NA 142 141 140 141  K 3.9 4.5 4.0 4.2  CL 107 102 106 108  CO2 GLUCOSE 101* 94 117* 100*  BUN CREATININE 0.99 1.11 1.14 1.05  CALCIUM 9.2 9.6 8.5* 8.4*    Liver Function Tests: No results for input(s): AST, ALT, ALKPHOS, BILITOT, PROT, ALBUMIN in the last 168 hours. No results for input(s): LIPASE, AMYLASE in the last 168 hours.  Recent Labs Lab 10/16/15 1527  AMMONIA 78*    CBC:  Recent Labs Lab 10/10/15 1804 10/16/15 1318 10/17/15 0406  WBC 4.6 3.7* 2.2*  NEUTROABS  --  1.5  --   HGB 13.9 15.2 13.3  HCT 40.4 44.9 38.9*  MCV 85.1 84.7 84.2  PLT 227 249 209    Cardiac Enzymes: No results for input(s): CKTOTAL, CKMB, CKMBINDEX, TROPONINI in  the last 168 hours.  BNP: Invalid input(s): POCBNP  CBG:  Recent Labs Lab 10/16/15 1313  GLUCAP 63*    Microbiology: No results found for this or any previous visit.  Coagulation Studies: No results for input(s): LABPROT, INR in the last 72 hours.  Urinalysis:  Recent Labs Lab 10/10/15 1930 10/16/15 1319  COLORURINE YELLOW* YELLOW*  LABSPEC 1.020 1.024  PHURINE 6.0 6.0  GLUCOSEU NEGATIVE NEGATIVE  HGBUR NEGATIVE NEGATIVE  BILIRUBINUR NEGATIVE NEGATIVE  KETONESUR NEGATIVE 1+*  PROTEINUR NEGATIVE NEGATIVE  NITRITE NEGATIVE NEGATIVE  LEUKOCYTESUR NEGATIVE NEGATIVE    Lipid Panel:  No results found for: CHOL, TRIG,  HDL, CHOLHDL, VLDL, LDLCALC  HgbA1C: No results found for: HGBA1C  Urine Drug Screen:     Component Value Date/Time   LABOPIA NONE DETECTED 10/16/2015 1319   LABOPIA NONE DETECTED 05/12/2015 1752   COCAINSCRNUR NONE DETECTED 05/12/2015 1752   LABBENZ NONE DETECTED 10/16/2015 1319   LABBENZ NONE DETECTED 05/12/2015 1752   AMPHETMU NONE DETECTED 10/16/2015 1319   AMPHETMU NONE DETECTED 05/12/2015 1752   THCU NONE DETECTED 10/16/2015 1319   THCU POSITIVE* 05/12/2015 1752   LABBARB NONE DETECTED 10/16/2015 1319   LABBARB NONE DETECTED 05/12/2015 1752    Alcohol Level:  Recent Labs Lab 10/16/15 1318  ETH <5    Other results: EKG: sinus rhythm at 67 bpm.  Imaging: Ct Head Wo Contrast  10/16/2015  CLINICAL DATA:  Overdose. Unresponsive on arrival. Possible seizure. EXAM: CT HEAD WITHOUT CONTRAST TECHNIQUE: Contiguous axial images were obtained from the base of the skull through the vertex without intravenous contrast. COMPARISON:  09/25/2015. FINDINGS: There is no evidence for acute hemorrhage, hydrocephalus, mass lesion, or abnormal extra-axial fluid collection. No definite CT evidence for acute infarction. The visualized paranasal sinuses and mastoid air cells are clear. IMPRESSION: Stable.  Normal exam. Electronically Signed   By: Kennith Center M.D.   On: 10/16/2015 13:36     Assessment/Plan: 35 year old male presenting with breakthrough seizures and a toxic Depakote level.  Patient does not admit to overdose.  Very unlikely though that patient would have been achieve a level of 188 on only  BID in the face of normal liver function.  Last LFT's were on 1/14 and were unremarkable.  Also possible of peak level was considered but repeat level 9 hours later continued to be elevated.  Therefore concern still remains for overdose.  Unclear if patient actually had a seizure or if he was feeling poorly from the high Depakote level.  Ammonia elevated as well at 78.  Head CT personally  reviewed and shows no acute changes.    Recommendations: 1.  Repeat VPA level today. 2.  EEG.  Mental status appears still altered.  Unclear if this is medication or seizure related 3.  Seizure precautions 4.  Agree with suicide precautions until patient can be evaluated by psychiatry 5.  Continue Keppra at  BID 6.  Agree with holding Depakote.  Will restart once patient's level becomes therapeutic 7.  Based on description in chart from previous encounters patient will likely need EMU monitoring in the future.  Reports he is to see someone at [facility] but has not scheduled appointment.    Case discussed with Dr. Delane Ginger, MD Neurology [phone] 10/17/2015, 10:39 AM

## 2015-10-17 NOTE — Progress Notes (Signed)
[facility] [place] at [facility]   PATIENT NAME: Keith Chung    MR#:  [id number]  DATE OF BIRTH:  03-31-1981  SUBJECTIVE:  CHIEF COMPLAINT:   Chief Complaint  Patient presents with  . Seizures    Came with feeling of dizziness, have high blood level of valproic acid.   Denies any intentional overdose of his pills.  REVIEW OF SYSTEMS:   Constitutional: Negative for fever, chills, weight loss and malaise/fatigue.  HENT: Negative for ear discharge, ear pain, hearing loss, nosebleeds and tinnitus.  Eyes: Negative for blurred vision, double vision and photophobia.  Respiratory: Negative for cough, hemoptysis, shortness of breath and wheezing.  Cardiovascular: Negative for chest pain, palpitations, orthopnea and leg swelling.  Gastrointestinal: Negative for heartburn, nausea, vomiting, abdominal pain, diarrhea, constipation and melena.  Genitourinary: Negative for dysuria, urgency, frequency and hematuria.  Musculoskeletal: Negative for myalgias, back pain and neck pain.  Skin: Negative for rash.  Neurological: Positive for sensory change, seizures and headaches. Negative for dizziness, tingling, tremors, speech change and focal weakness.  Endo/Heme/Allergies: Does not bruise/bleed easily.  Psychiatric/Behavioral: Negative for depression.   ROS  DRUG ALLERGIES:   Allergies  Allergen Reactions  . Pollen Extract Other (See Comments)    Runny nose, watery eyes, sneezing    VITALS:  Blood pressure 147/83, pulse 60, temperature 97.4 F (36.3 C), temperature source Oral, resp. rate 18, height  (1.88 m), weight 67.903 kg (149 lb 11.2 oz), SpO2 97 %.  PHYSICAL EXAMINATION:   GENERAL: 35 y.o.-year-old patient lying in the bed with no acute distress.  EYES: Pupils equal, round, reactive to light and accommodation. No scleral icterus. Extraocular muscles intact.  HEENT: Head atraumatic, normocephalic. Oropharynx and nasopharynx clear. No lip or  tongue laceration seen. NECK: Supple, no jugular venous distention. No thyroid enlargement, no tenderness.  LUNGS: Normal breath sounds bilaterally, no wheezing, rales,rhonchi or crepitation. No use of accessory muscles of respiration.  CARDIOVASCULAR: S1, S2 normal. No murmurs, rubs, or gallops.  ABDOMEN: Soft, nontender, nondistended. Bowel sounds present. No organomegaly or mass.  EXTREMITIES: No pedal edema, cyanosis, or clubbing.  NEUROLOGIC: Cranial nerves II through XII are intact. Muscle strength 5/5 in all extremities. Sensation intact. Gait not checked.  PSYCHIATRIC: The patient is alert and oriented x 3.  SKIN: No obvious rash, lesion, or ulcer.   Physical Exam LABORATORY PANEL:   CBC  Recent Labs Lab 10/17/15 0406  WBC 2.2*  HGB 13.3  HCT 38.9*  PLT 209   ------------------------------------------------------------------------------------------------------------------  Chemistries   Recent Labs Lab 10/17/15 0406  NA 141  K 4.2  CL 108  CO2 26  GLUCOSE 100*  BUN 12  CREATININE 1.05  CALCIUM 8.4*   ------------------------------------------------------------------------------------------------------------------  Cardiac Enzymes No results for input(s): TROPONINI in the last 168 hours. ------------------------------------------------------------------------------------------------------------------  RADIOLOGY:  Ct Head Wo Contrast  10/16/2015  CLINICAL DATA:  Overdose. Unresponsive on arrival. Possible seizure. EXAM: CT HEAD WITHOUT CONTRAST TECHNIQUE: Contiguous axial images were obtained from the base of the skull through the vertex without intravenous contrast. COMPARISON:  09/25/2015. FINDINGS: There is no evidence for acute hemorrhage, hydrocephalus, mass lesion, or abnormal extra-axial fluid collection. No definite CT evidence for acute infarction. The visualized paranasal sinuses and mastoid air cells are clear. IMPRESSION: Stable.  Normal exam.  Electronically Signed   By: Kennith Center M.D.   On: 10/16/2015 13:36    ASSESSMENT AND PLAN:   Principal Problem:   Acute delirium Active Problems:   Overdose  Postictal state (HCC)   Epilepsy (HCC)   #1 Elevated Depakote level- denies any overdose- Denies increased accidental ingestion as well. - Hold depakote, psych consult pending - cont sitter until then,. - neuro checks, poison center was contacted, IV fluids, repeat depakote levels in 6-8 hrs - CT head without acute findings - monitor on tele - appreciated neurology consult.  #2 Seizure disorder- neuro consulted, hold depakote - cont keppra - outpatient neurology follow up- set up- but hasn't seen a neurologist yet - neuro checks, seizure precautions - EEG per neurology  #3 Tobacco use disorder- says that he hasn't been smoking for 4 months now  #4 DVT Prophylaxis- lovenox    All the records are reviewed and case discussed with Care Management/Social Workerr. Management plans discussed with the patient, family and they are in agreement.  CODE STATUS: Full  TOTAL TIME TAKING CARE OF THIS PATIENT: 35 minutes.    POSSIBLE D/C IN 1-2 DAYS, DEPENDING ON CLINICAL CONDITION.   Altamese Dilling M.D on 10/17/2015   Between 7am to 6pm - Pager - [phone]  After 6pm go to [email or web address] - password [email or web address]  [facility]  Office  [phone]  CC: Primary care physician; Pcp Not In System  Note: This dictation was prepared with Dragon dictation along with smaller phrase technology. Any transcriptional errors that result from this process are unintentional.

## 2015-10-17 NOTE — Consult Note (Signed)
[facility] ([place] Face-to-Face Psychiatry Consult   Reason for Consult:  Consult for this 35 year old man with a history of epilepsy who was brought into the hospital found passed out and with an elevated Depakote level. Concern evidently for possible intentional overdose Referring Physician:  Marthann Schiller Patient Identification: Keith Chung MRN:  [id number] Principal Diagnosis: Acute delirium Diagnosis:   Patient Active Problem List   Diagnosis Date Noted  . Acute delirium [R41.0] 10/17/2015  . Postictal state ([place]) [R56.9] 10/17/2015  . Epilepsy ([place]) [M09.470] 10/17/2015  . Overdose [T50.901A] 10/16/2015    Total Time spent with patient: 1 hour  Subjective:   Keith Chung is a 35 y.o. male patient admitted with "I aint trying to hurt nobody or myself".  HPI:  Patient interviewed. Chart reviewed. Old notes reviewed. This 35 year old man with a chronic seizure disorder was brought to the emergency room after being found passed out in public. His Depakote level was found to be elevated and it looks like it peaked at about 190. Patient was admitted to the hospital for stabilization. Concern was raised about the possibility of intentional overdose. The patient tells me that he does not have any memory of overdosing on his medicine. He tells me that he knows that he is supposed to take 3 of the keppra in the morning and 1 Depakote in the morning and then the same thing at night. Patient tells me that his mood recently has been fine. Not feeling sad or depressed. Absolutely no hopelessness. Absolutely no suicidal thoughts. He has had some stressful life events recently. For the last week he's been living by himself in a motel. He had to move into the motel because there was a time limit on how long he could stay at his mother's apartment in public housing. Sounds like he's probably been without supervision or assistance for a little longer than usual this past week. He also tells me that he's had many more  seizures recently than he used to. Up until last year when he stopped taking Dilantin he says that he would only have about one seizure every 6 months. Now he says he's had several in just this month and is frustrated about it. Patient denies any depression as noted above. He denies any hallucinations or delusions or paranoia. Eyes that he's been abusing drugs or alcohol.  Social history: Patient has applied for disability but does not get a check. 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 He tells me that he would very much like to get back to work. He has a fiance in [place] and he has 26 young children that he would like to stay in contact with. Right now however it sounds like he is in a pretty financially difficult place. He gets his medicine from medication management and [facility]  Medical history: Patient has seizure disorder which she says he has had essentially all of his life. He doesn't know of their ever being any cause to it.  Substance abuse history: Denies use of alcohol or drugs or any past history of alcohol or drug abuse  Past Psychiatric History: Patient has no psychiatric history. Denies ever seeing a psychiatrist or mental health provider in the past. No history of suicide attempts no history of psychiatric  hospitalization.  Risk to Self: Is patient at risk for suicide?: No Risk to Others:   Prior Inpatient Therapy:   Prior Outpatient Therapy:    Past Medical History:  Past Medical History  Diagnosis Date  . Seizures [facility])     Past Surgical History  Procedure Laterality Date  . Vasectomy Bilateral   . Hand surgery      right hand surgery   Family History:  Family History  Problem Relation Age of Onset  . Hypertension Mother   . Diabetes Mellitus II Maternal  Grandmother    Family Psychiatric  History: Patient denies knowing of any family history of mental health or substance abuse problems Social History:  History  Alcohol Use No    Comment: socially     History  Drug Use  . Yes  . Special: Marijuana    Social History   Social History  . Marital Status: Single    Spouse Name: N/A  . Number of Children: N/A  . Years of Education: N/A   Social History Main Topics  . Smoking status: Light Tobacco Smoker -- 0.30 packs/day    Types: Cigarettes    Last Attempt to Quit: 07/18/2014  . Smokeless tobacco: None     Comment: stopped 4 months ago  . Alcohol Use: No     Comment: socially  . Drug Use: Yes    Special: Marijuana  . Sexual Activity: Yes    Birth Control/ Protection: Surgical   Other Topics Concern  . None   Social History Narrative   Living in a hotel now as Mother moved to [employer] [place]. Not driving or working due to seizures.   Additional Social History:                          Allergies:   Allergies  Allergen Reactions  . Pollen Extract Other (See Comments)    Runny nose, watery eyes, sneezing    Labs:  Results for orders placed or performed during the hospital encounter of 10/16/15 (from the past 48 hour(s))  Glucose, capillary     Status: Abnormal   Collection Time: 10/16/15  1:13 PM  Result Value Ref Range   Glucose-Capillary 63 (L) 65 - 99 mg/dL  CBC with Differential     Status: Abnormal   Collection Time: 10/16/15  1:18 PM  Result Value Ref Range   WBC 3.7 (L) 3.8 - 10.6 K/uL   RBC 5.30 4.40 - 5.90 MIL/uL   Hemoglobin 15.2 13.0 - 18.0 g/dL   HCT 44.9 40.0 - 52.0 %   MCV 84.7 80.0 - 100.0 fL   MCH 28.7 26.0 - 34.0 pg   MCHC 33.8 32.0 - 36.0 g/dL   RDW 12.5 11.5 - 14.5 %   Platelets 249 150 - 440 K/uL   Neutrophils Relative % 41 %   Neutro Abs 1.5 1.4 - 6.5 K/uL   Lymphocytes Relative 44 %   Lymphs Abs 1.6 1.0 - 3.6 K/uL   Monocytes Relative 9 %   Monocytes Absolute 0.3 0.2 - 1.0  K/uL   Eosinophils Relative 5 %   Eosinophils Absolute 0.2 0 - 0.7 K/uL   Basophils Relative 1 %   Basophils Absolute 0.0 0 - 0.1 K/uL  Basic metabolic panel     Status: None   Collection Time: 10/16/15  1:18 PM  Result Value Ref Range   Sodium 141 135 - 145 mmol/L   Potassium 4.5 3.5 -  5.1 mmol/L   Chloride 102 101 - 111 mmol/L   CO2 31 22 - 32 mmol/L   Glucose, Bld 94 65 - 99 mg/dL   BUN 9 6 - 20 mg/dL   Creatinine, Ser 1.11 0.61 - 1.24 mg/dL   Calcium 9.6 8.9 - 10.3 mg/dL   GFR calc non Af Amer >60 >60 mL/min   GFR calc Af Amer >60 >60 mL/min    Comment: (NOTE) The eGFR has been calculated using the CKD EPI equation. This calculation has not been validated in all clinical situations. eGFR's persistently <60 mL/min signify possible Chronic Kidney Disease.    Anion gap 8 5 - 15  Valproic acid level     Status: Abnormal   Collection Time: 10/16/15  1:18 PM  Result Value Ref Range   Valproic Acid Lvl 188 (HH) 50.0 - 100.0 ug/mL    Comment: RESULT CONFIRMED BY MANUAL DILUTION CRITICAL RESULT CALLED TO, READ BACK BY AND VERIFIED WITH: GRACE CINDRIC AT 1445 10/16/15 BY [facility]   Ethanol     Status: None   Collection Time: 10/16/15  1:18 PM  Result Value Ref Range   Alcohol, Ethyl (B) <5 <5 mg/dL    Comment:        LOWEST DETECTABLE LIMIT FOR SERUM ALCOHOL IS 5 mg/dL FOR MEDICAL PURPOSES ONLY   Acetaminophen level     Status: Abnormal   Collection Time: 10/16/15  1:18 PM  Result Value Ref Range   Acetaminophen (Tylenol), Serum <10 (L) 10 - 30 ug/mL    Comment:        THERAPEUTIC CONCENTRATIONS VARY SIGNIFICANTLY. A RANGE OF 10-30 ug/mL MAY BE AN EFFECTIVE CONCENTRATION FOR MANY PATIENTS. HOWEVER, SOME ARE BEST TREATED AT CONCENTRATIONS OUTSIDE THIS RANGE. ACETAMINOPHEN CONCENTRATIONS >150 ug/mL AT 4 HOURS AFTER INGESTION AND >50 ug/mL AT 12 HOURS AFTER INGESTION ARE OFTEN ASSOCIATED WITH TOXIC REACTIONS.   Salicylate level     Status: None   Collection Time:  10/16/15  1:18 PM  Result Value Ref Range   Salicylate Lvl <1.6 2.8 - 30.0 mg/dL  Urine Drug Screen, Qualitative ([facility] only)     Status: None   Collection Time: 10/16/15  1:19 PM  Result Value Ref Range   Tricyclic, Ur Screen NONE DETECTED NONE DETECTED   Amphetamines, Ur Screen NONE DETECTED NONE DETECTED   MDMA (Ecstasy)Ur Screen NONE DETECTED NONE DETECTED   Cocaine Metabolite,Ur [place] NONE DETECTED NONE DETECTED   Opiate, Ur Screen NONE DETECTED NONE DETECTED   Phencyclidine (PCP) Ur S NONE DETECTED NONE DETECTED   Cannabinoid 50 Ng, Ur [place] NONE DETECTED NONE DETECTED   Barbiturates, Ur Screen NONE DETECTED NONE DETECTED   Benzodiazepine, Ur Scrn NONE DETECTED NONE DETECTED   Methadone Scn, Ur NONE DETECTED NONE DETECTED    Comment: (NOTE) 109  Tricyclics, urine               Cutoff 1000 ng/mL 200  Amphetamines, urine             Cutoff 1000 ng/mL 300  MDMA (Ecstasy), urine           Cutoff 500 ng/mL 400  Cocaine Metabolite, urine       Cutoff 300 ng/mL 500  Opiate, urine                   Cutoff 300 ng/mL 600  Phencyclidine (PCP), urine      Cutoff 25 ng/mL 700  Cannabinoid, urine  Cutoff 50 ng/mL 800  Barbiturates, urine             Cutoff 200 ng/mL 900  Benzodiazepine, urine           Cutoff 200 ng/mL 1000 Methadone, urine                Cutoff 300 ng/mL 1100 1200 The urine drug screen provides only a preliminary, unconfirmed 1300 analytical test result and should not be used for non-medical 1400 purposes. Clinical consideration and professional judgment should 1500 be applied to any positive drug screen result due to possible 1600 interfering substances. A more specific alternate chemical method 1700 must be used in order to obtain a confirmed analytical result.  1800 Gas chromato graphy / mass spectrometry (GC/MS) is the preferred 1900 confirmatory method.   Urinalysis complete, with microscopic ([facility] only)     Status: Abnormal   Collection Time: 10/16/15  1:19  PM  Result Value Ref Range   Color, Urine YELLOW (A) YELLOW   APPearance CLEAR (A) CLEAR   Glucose, UA NEGATIVE NEGATIVE mg/dL   Bilirubin Urine NEGATIVE NEGATIVE   Ketones, ur 1+ (A) NEGATIVE mg/dL   Specific Gravity, Urine 1.024 1.005 - 1.030   Hgb urine dipstick NEGATIVE NEGATIVE   pH 6.0 5.0 - 8.0   Protein, ur NEGATIVE NEGATIVE mg/dL   Nitrite NEGATIVE NEGATIVE   Leukocytes, UA NEGATIVE NEGATIVE   RBC / HPF 0-5 0 - 5 RBC/hpf   WBC, UA 0-5 0 - 5 WBC/hpf   Bacteria, UA NONE SEEN NONE SEEN   Squamous Epithelial / LPF NONE SEEN NONE SEEN  Ammonia     Status: Abnormal   Collection Time: 10/16/15  3:27 PM  Result Value Ref Range   Ammonia 78 (H) 9 - 35 umol/L  Valproic acid level     Status: Abnormal   Collection Time: 10/16/15  9:53 PM  Result Value Ref Range   Valproic Acid Lvl 191 (HH) 50.0 - 100.0 ug/mL    Comment: CRITICAL RESULT CALLED TO, READ BACK BY AND VERIFIED WITH: ADRIANNE WHITE AT 2312 10/16/15.PMH   Basic metabolic panel     Status: Abnormal   Collection Time: 10/16/15  9:53 PM  Result Value Ref Range   Sodium 140 135 - 145 mmol/L   Potassium 4.0 3.5 - 5.1 mmol/L   Chloride 106 101 - 111 mmol/L   CO2 27 22 - 32 mmol/L   Glucose, Bld 117 (H) 65 - 99 mg/dL   BUN 10 6 - 20 mg/dL   Creatinine, Ser 1.14 0.61 - 1.24 mg/dL   Calcium 8.5 (L) 8.9 - 10.3 mg/dL   GFR calc non Af Amer >60 >60 mL/min   GFR calc Af Amer >60 >60 mL/min    Comment: (NOTE) The eGFR has been calculated using the CKD EPI equation. This calculation has not been validated in all clinical situations. eGFR's persistently <60 mL/min signify possible Chronic Kidney Disease.    Anion gap 7 5 - 15  Basic metabolic panel     Status: Abnormal   Collection Time: 10/17/15  4:06 AM  Result Value Ref Range   Sodium 141 135 - 145 mmol/L   Potassium 4.2 3.5 - 5.1 mmol/L   Chloride 108 101 - 111 mmol/L   CO2 26 22 - 32 mmol/L   Glucose, Bld 100 (H) 65 - 99 mg/dL   BUN 12 6 - 20 mg/dL   Creatinine,  Ser 1.05 0.61 - 1.24 mg/dL  Calcium 8.4 (L) 8.9 - 10.3 mg/dL   GFR calc non Af Amer >60 >60 mL/min   GFR calc Af Amer >60 >60 mL/min    Comment: (NOTE) The eGFR has been calculated using the CKD EPI equation. This calculation has not been validated in all clinical situations. eGFR's persistently <60 mL/min signify possible Chronic Kidney Disease.    Anion gap 7 5 - 15  CBC     Status: Abnormal   Collection Time: 10/17/15  4:06 AM  Result Value Ref Range   WBC 2.2 (L) 3.8 - 10.6 K/uL   RBC 4.62 4.40 - 5.90 MIL/uL   Hemoglobin 13.3 13.0 - 18.0 g/dL   HCT 38.9 (L) 40.0 - 52.0 %   MCV 84.2 80.0 - 100.0 fL   MCH 28.7 26.0 - 34.0 pg   MCHC 34.1 32.0 - 36.0 g/dL   RDW 12.6 11.5 - 14.5 %   Platelets 209 150 - 440 K/uL  Valproic acid level     Status: Abnormal   Collection Time: 10/17/15 10:59 AM  Result Value Ref Range   Valproic Acid Lvl 103 (H) 50.0 - 100.0 ug/mL    Current Facility-Administered Medications  Medication Dose Route Frequency Provider Last Rate Last Dose  . 0.9 %  sodium chloride infusion   Intravenous Continuous Gladstone Lighter, MD 100 mL/hr at 10/17/15 1454    . acetaminophen (TYLENOL) tablet 650 mg  650 mg Oral Q6H PRN Gladstone Lighter, MD   650 mg at 10/17/15 0725   Or  . acetaminophen (TYLENOL) suppository 650 mg  650 mg Rectal Q6H PRN Gladstone Lighter, MD      . enoxaparin (LOVENOX) injection 40 mg  40 mg Subcutaneous Q24H Gladstone Lighter, MD   40 mg at 10/16/15 2020  . levETIRAcetam (KEPPRA) tablet 1,500 mg  1,500 mg Oral BID Gladstone Lighter, MD   1,500 mg at 10/16/15 2138  . ondansetron (ZOFRAN) tablet 4 mg  4 mg Oral Q6H PRN Gladstone Lighter, MD       Or  . ondansetron (ZOFRAN) injection 4 mg  4 mg Intravenous Q6H PRN Gladstone Lighter, MD      . pantoprazole (PROTONIX) EC tablet 40 mg  40 mg Oral Daily Gladstone Lighter, MD   40 mg at 10/17/15 0920  . sodium chloride flush (NS) 0.9 % injection 3 mL  3 mL Intravenous Q12H Gladstone Lighter, MD   3  mL at 10/16/15 2138    Musculoskeletal: Strength & Muscle Tone: within normal limits Gait & Station: normal Patient leans: N/A  Psychiatric Specialty Exam: Review of Systems  Constitutional: Negative.   HENT: Negative.   Eyes: Negative.   Respiratory: Negative.   Cardiovascular: Negative.   Gastrointestinal: Negative.   Musculoskeletal: Negative.   Skin: Negative.   Neurological: Negative.   Psychiatric/Behavioral: Positive for memory loss. Negative for depression, suicidal ideas, hallucinations and substance abuse. The patient is nervous/anxious. The patient does not have insomnia.     Blood pressure 147/83, pulse 60, temperature 97.4 F (36.3 C), temperature source Oral, resp. rate 18, height _0  (1.88 m), weight 67.903 kg (149 lb 11.2 oz), SpO2 97 %.Body mass index is 19.21 kg/(m^2).  General Appearance: Casual  Eye Contact::  Good  Speech:  Clear and Coherent  Volume:  Decreased  Mood:  Anxious  Affect:  Congruent  Thought Process:  Tangential  Orientation:  Full (Time, Place, and Person)  Thought Content:  Negative  Suicidal Thoughts:  No  Homicidal Thoughts:  No  Memory:  Immediate;   Good Recent;   Fair Remote;   Fair  Judgement:  Fair  Insight:  Good  Psychomotor Activity:  Normal  Concentration:  Fair  Recall:  [employer] of Knowledge:Fair  Language: Fair  Akathisia:  No  Handed:  Right  AIMS (if indicated):     Assets:  Desire for Improvement Resilience Social Support  ADL's:  Intact  Cognition: Impaired,  Mild  Sleep:      Treatment Plan Summary: Daily contact with patient to assess and evaluate symptoms and progress in treatment, Medication management and Plan 35 year old man with epileptic disorder who was found to have an elevated Depakote level. Patiently has consistently denied having any intentional overdose. I would have no reason to doubt him. He is not endorsing any symptoms of depression. He completely denies suicidal ideas. He is full of  talk about positive plans of the future and positive things in his life. He doesn't have any past psychiatric history. I don't think that the patient is at any significant risk of suicide. I don't know why he had an elevated Depakote level. I speculated with him that it's possible that in a postictal state he might of accidentally taken more pills than he was supposed to eye would also speculate that living by himself and he may be just cognitively not able to take care of himself as well as he would like. He tells me he has a learning disability and although I didn't do much formal testing is pretty clear talking with him that he has some cognitive slowing. It's possible that he just may not be very good at taking care of his medicine on a practical basis. In any case the patient doesn't need any other psychiatric treatment. I encouraged him to consider getting daily pillboxes to keep his medicine and. Sitter discontinued. No need to follow-up from my point of view..  Disposition: Patient does not meet criteria for psychiatric inpatient admission. Supportive therapy provided about ongoing stressors.  Callen Vancuren 10/17/2015 7:45 PM

## 2015-10-17 NOTE — Procedures (Signed)
ELECTROENCEPHALOGRAM REPORT   Patient: Keith Chung       Room #: 260A-AA EEG No. ID: [id number] Age: 35 y.o.        Sex: male Referring Physician: Elisabeth Pigeon Report Date:  10/17/2015        Interpreting Physician: Thana Farr  History: [name] is an 35 y.o. male with a history of seizures presenting with breakthrough seizures  Medications:  Scheduled: . enoxaparin (LOVENOX) injection  40 mg Subcutaneous Q24H  . levETIRAcetam  1,500 mg Oral BID  . pantoprazole  40 mg Oral Daily  . sodium chloride flush  3 mL Intravenous Q12H    Conditions of Recording:  This is a 16 channel EEG carried out with the patient in the awake and drowsy states.  Description:  The waking background activity consists of a low voltage, symmetrical, fairly well organized, 9 Hz alpha activity, seen from the parieto-occipital and posterior temporal regions.  Low voltage fast activity, poorly organized, is seen anteriorly and is at times superimposed on more posterior regions.  A mixture of theta and alpha rhythms are seen from the central and temporal regions. The patient drowses with slowing to irregular, low voltage theta and beta activity.   Stage II sleep is not obtained. There is noted intermittently sharp transients over the right temporal region with phase reversal at T6. Hyperventilation produced a mild to moderate buildup but failed to elicit any additional abnormalities.  Intermittent photic stimulation was performed but failed to illicit any change in the tracing.     IMPRESSION: This is an abnormal electroencephalogram due to sharp transients over the right temporal region with phase reversal at T6.  This is consistent with the patient's history of seizures.      Thana Farr, MD Neurology [phone] 10/17/2015, 2:03 PM

## 2015-10-18 DIAGNOSIS — T426X1A Poisoning by other antiepileptic and sedative-hypnotic drugs, accidental (unintentional), initial encounter: Secondary | ICD-10-CM | POA: Insufficient documentation

## 2015-10-18 DIAGNOSIS — T426X4A Poisoning by other antiepileptic and sedative-hypnotic drugs, undetermined, initial encounter: Secondary | ICD-10-CM

## 2015-10-18 LAB — VALPROIC ACID LEVEL: Valproic Acid Lvl: 43 ug/mL — ABNORMAL LOW (ref 50.0–100.0)

## 2015-10-18 MED ORDER — DIVALPROEX SODIUM 250 MG PO DR TAB
250.0000 mg | DELAYED_RELEASE_TABLET | Freq: Two times a day (BID) | ORAL | Status: DC
  Administered 2015-10-18: 250 mg via ORAL
  Filled 2015-10-18 (×2): qty 1

## 2015-10-18 MED ORDER — DIVALPROEX SODIUM 500 MG PO DR TAB
500.0000 mg | DELAYED_RELEASE_TABLET | Freq: Two times a day (BID) | ORAL | Status: DC
  Filled 2015-10-18 (×2): qty 1

## 2015-10-18 MED ORDER — DIVALPROEX SODIUM 250 MG PO DR TAB: 250.0000 mg | DELAYED_RELEASE_TABLET | Freq: Two times a day (BID) | ORAL | Status: DC

## 2015-10-18 NOTE — Discharge Instructions (Signed)
Follow with neurology clinic in 2-3 weeks.

## 2015-10-18 NOTE — Progress Notes (Signed)
Subjective: No further seizure activity noted.  Patient taken off suicide precautions.    Objective: Current vital signs: BP 131/80 mmHg  Pulse 50  Temp(Src) 97.4 F (36.3 C) (Oral)  Resp 18  Ht  (1.88 m)  Wt 67.903 kg (149 lb 11.2 oz)  BMI 19.21 kg/m2  SpO2 98% Vital signs in last 24 hours: Temp:  [97.4 F (36.3 C)-97.9 F (36.6 C)] 97.4 F (36.3 C) (01/31 0455) Pulse Rate:  [50-70] 50 (01/31 0455) Resp:  [16-18] 18 (01/31 0455) BP: (129-147)/(67-83) 131/80 mmHg (01/31 0455) SpO2:  [97 %-100 %] 98 % (01/31 0455)  Intake/Output from previous day: 01/30 0701 - 01/31 0700 In: 2696.7 [P.O.:360; I.V.:2336.7] Out: 1375 [Urine:1375] Intake/Output this shift:   Nutritional status: Diet 2 gram sodium Room service appropriate?: Yes; Fluid consistency:: Thin  Neurologic Exam: Mental Status: Affect flat.  Speech fluent without evidence of aphasia. Able to follow 3 step commands without difficulty. Cranial Nerves: II: Discs flat bilaterally; Visual fields grossly normal, pupils equal, round, reactive to light and accommodation III,IV, VI: ptosis not present, extra-ocular motions intact bilaterally V,VII: smile symmetric, facial light touch sensation normal bilaterally VIII: hearing normal bilaterally IX,X: gag reflex present XI: bilateral shoulder shrug XII: midline tongue extension Motor: Gave very little effort but able to lift all extremities against gravity without any focal weakness noted.    Lab Results: Basic Metabolic Panel:  Recent Labs Lab 10/16/15 1318 10/16/15 2153 10/17/15 0406  NA 141 140 141  K 4.5 4.0 4.2  CL 102 106 108  CO2 GLUCOSE 94 117* 100*  BUN CREATININE 1.11 1.14 1.05  CALCIUM 9.6 8.5* 8.4*    Liver Function Tests: No results for input(s): AST, ALT, ALKPHOS, BILITOT, PROT, ALBUMIN in the last 168 hours. No results for input(s): LIPASE, AMYLASE in the last 168 hours.  Recent Labs Lab 10/16/15 1527  AMMONIA  78*    CBC:  Recent Labs Lab 10/16/15 1318 10/17/15 0406  WBC 3.7* 2.2*  NEUTROABS 1.5  --   HGB 15.2 13.3  HCT 44.9 38.9*  MCV 84.7 84.2  PLT 249 209    Cardiac Enzymes: No results for input(s): CKTOTAL, CKMB, CKMBINDEX, TROPONINI in the last 168 hours.  Lipid Panel: No results for input(s): CHOL, TRIG, HDL, CHOLHDL, VLDL, LDLCALC in the last 168 hours.  CBG:  Recent Labs Lab 10/16/15 1313  GLUCAP 63*    Microbiology: No results found for this or any previous visit.  Coagulation Studies: No results for input(s): LABPROT, INR in the last 72 hours.  Imaging: Ct Head Wo Contrast  10/16/2015  CLINICAL DATA:  Overdose. Unresponsive on arrival. Possible seizure. EXAM: CT HEAD WITHOUT CONTRAST TECHNIQUE: Contiguous axial images were obtained from the base of the skull through the vertex without intravenous contrast. COMPARISON:  09/25/2015. FINDINGS: There is no evidence for acute hemorrhage, hydrocephalus, mass lesion, or abnormal extra-axial fluid collection. No definite CT evidence for acute infarction. The visualized paranasal sinuses and mastoid air cells are clear. IMPRESSION: Stable.  Normal exam. Electronically Signed   By: Kennith Center M.D.   On: 10/16/2015 13:36    Medications:  I have reviewed the patient's current medications. Scheduled: . divalproex  250 mg Oral BID  . enoxaparin (LOVENOX) injection  40 mg Subcutaneous Q24H  . levETIRAcetam  1,500 mg Oral BID  . pantoprazole  40 mg Oral Daily    Assessment/Plan: No further seizures noted.  EEG reviewed and shows right  temporal sharp activity.  Depakote level this AM of 43.    Recommendations: 1.  Restart Depakote at  BID 2.  Continue Keppra at  BID    LOS: 2 days   Thana Farr, MD Neurology [phone] 10/18/2015  9:34 AM

## 2015-10-18 NOTE — Discharge Summary (Signed)
[facility] -  at [facility]   PATIENT NAME: Herbert Marken    MR#:  [id number]  DATE OF BIRTH:  1981/06/13  DATE OF ADMISSION:  10/16/2015 ADMITTING PHYSICIAN: Enid Baas, MD  DATE OF DISCHARGE: 10/18/2015  PRIMARY CARE PHYSICIAN: Pcp Not In System    ADMISSION DIAGNOSIS:  Valproic acid toxicity, undetermined intent, initial encounter [T42.6X4A]  DISCHARGE DIAGNOSIS:  Principal Problem:   Acute delirium Active Problems:   Overdose   Postictal state (HCC)   Epilepsy (HCC)   Valproic acid toxicity   SECONDARY DIAGNOSIS:   Past Medical History  Diagnosis Date  . Seizures (HCC)     HOSPITAL COURSE:   #1 Elevated Depakote level- denies any overdose- Denies increased accidental ingestion as well. - Hold depakote, psych consult done- they approved- d/c sitter and no suicidal intention. - neuro checks, poison center was contacted, IV fluids, repeat depakote levels next day came normal. - CT head without acute findings - monitored on tele- EEG done - appreciated neurology consult.- suggested to decrease depakote dose.  #2 Seizure disorder- neuro consulted, hold depakote - cont keppra - outpatient neurology follow up- set up- but hasn't seen a neurologist yet - neuro checks, seizure precautions - EEG per neurology - decrease depakote on d/c.  #3 Tobacco use disorder- says that he hasn't been smoking for 4 months now  #4 DVT Prophylaxis- lovenox  DISCHARGE CONDITIONS:   Stable.  CONSULTS OBTAINED:  Treatment Team:  Audery Amel, MD Thana Farr, MD  DRUG ALLERGIES:   Allergies  Allergen Reactions  . Pollen Extract Other (See Comments)    Runny nose, watery eyes, sneezing    DISCHARGE MEDICATIONS:   Current Discharge Medication List    CONTINUE these medications which have CHANGED   Details  divalproex (DEPAKOTE) 250 MG DR tablet Take 1 tablet (250 mg total) by mouth 2 (two) times daily. Qty: 60 tablet,  Refills: 0      CONTINUE these medications which have NOT CHANGED   Details  levETIRAcetam (KEPPRA) 500 MG tablet Take 3 tablets (1,500 mg total) by mouth 2 (two) times daily. Qty: 180 tablet, Refills: 2    pantoprazole (PROTONIX) 20 MG tablet Take 1 tablet (20 mg total) by mouth daily. Qty: 30 tablet, Refills: 1    HYDROcodone-acetaminophen (NORCO/VICODIN) 5-325 MG tablet Take 1 tablet by mouth every 4 (four) hours as needed for moderate pain. Qty: 12 tablet, Refills: 0      STOP taking these medications     diazepam (DIASTAT ACUDIAL) 10 MG GEL      penicillin v potassium (VEETID) 500 MG tablet          DISCHARGE INSTRUCTIONS:    Follow with neurologist in office in 2-3 weeks.  If you experience worsening of your admission symptoms, develop shortness of breath, life threatening emergency, suicidal or homicidal thoughts you must seek medical attention immediately by calling 911 or calling your MD immediately  if symptoms less severe.  You Must read complete instructions/literature along with all the possible adverse reactions/side effects for all the Medicines you take and that have been prescribed to you. Take any new Medicines after you have completely understood and accept all the possible adverse reactions/side effects.   Please note  You were cared for by a hospitalist during your hospital stay. If you have any questions about your discharge medications or the care you received while you were in the hospital after you are discharged, you can call the unit  and asked to speak with the hospitalist on call if the hospitalist that took care of you is not available. Once you are discharged, your primary care physician will handle any further medical issues. Please note that NO REFILLS for any discharge medications will be authorized once you are discharged, as it is imperative that you return to your primary care physician (or establish a relationship with a primary care physician  if you do not have one) for your aftercare needs so that they can reassess your need for medications and monitor your lab values.    Today   CHIEF COMPLAINT:   Chief Complaint  Patient presents with  . Seizures    HISTORY OF PRESENT ILLNESS:  Cross Jorge  is a 35 y.o. male with a known history of seizure disorder since he was a younger kid presents to the hospital after being found unresponsive and noted to have elevated depakote level. Patient was supposed to be on depakote and keppra at home for his seizures. Has had multiple ER vistst here at [facility], [facility] and [facility] for recurrent seizures. He actually did not have any depakote until 5 days ago when he was in the ER and was given a new prescription by ER physician for  BID and he got it filled on 10/14/15. Denies any intentional overdose. He says he woke up today and took his seizure meds as directed, later felt like he was going to have a seizure, didn't feel well and called EMS. When EMS arrived, he was lethargic. Denies any overdose. Patient is living in a motel now. No witnessed seizures. He is noted to have elevated depakote level of 188 in ER today.   VITAL SIGNS:  Blood pressure 127/79, pulse 60, temperature 97.5 F (36.4 C), temperature source Oral, resp. rate 18, height  (1.88 m), weight 67.903 kg (149 lb 11.2 oz), SpO2 100 %.  I/O:   Intake/Output Summary (Last 24 hours) at 10/18/15 1350 Last data filed at 10/18/15 0947  Gross per 24 hour  Intake 2816.67 ml  Output   1150 ml  Net 1666.67 ml    PHYSICAL EXAMINATION:  GENERAL:  34 y.o.-year-old patient lying in the bed with no acute distress.  EYES: Pupils equal, round, reactive to light and accommodation. No scleral icterus. Extraocular muscles intact.  HEENT: Head atraumatic, normocephalic. Oropharynx and nasopharynx clear.  NECK:  Supple, no jugular venous distention. No thyroid enlargement, no tenderness.  LUNGS: Normal breath sounds bilaterally, no  wheezing, rales,rhonchi or crepitation. No use of accessory muscles of respiration.  CARDIOVASCULAR: S1, S2 normal. No murmurs, rubs, or gallops.  ABDOMEN: Soft, non-tender, non-distended. Bowel sounds present. No organomegaly or mass.  EXTREMITIES: No pedal edema, cyanosis, or clubbing.  NEUROLOGIC: Cranial nerves II through XII are intact. Muscle strength 5/5 in all extremities. Sensation intact. Gait not checked.  PSYCHIATRIC: The patient is alert and oriented x 3.  SKIN: No obvious rash, lesion, or ulcer.   DATA REVIEW:   CBC  Recent Labs Lab 10/17/15 0406  WBC 2.2*  HGB 13.3  HCT 38.9*  PLT 209    Chemistries   Recent Labs Lab 10/17/15 0406  NA 141  K 4.2  CL 108  CO2 26  GLUCOSE 100*  BUN 12  CREATININE 1.05  CALCIUM 8.4*    Cardiac Enzymes No results for input(s): TROPONINI in the last 168 hours.  Microbiology Results  No results found for this or any previous visit.  RADIOLOGY:  No results found.  Management plans discussed with the patient, family and they are in agreement.  CODE STATUS:     Code Status Orders        Start     Ordered   10/16/15 1810  Full code   Continuous     10/16/15 1809    Code Status History    Date Active Date Inactive Code Status Order ID Comments User Context   This patient has a current code status but no historical code status.      TOTAL TIME TAKING CARE OF THIS PATIENT: 35 minutes.    Altamese Dilling M.D on 10/18/2015 at 1:50 PM  Between 7am to 6pm - Pager - [phone]  After 6pm go to [email or web address] - password [email or web address]  [facility]  Office  [phone]  CC: Primary care physician; Pcp Not In System   Note: This dictation was prepared with Dragon dictation along with smaller phrase technology. Any transcriptional errors that result from this process are unintentional.

## 2015-10-18 NOTE — Progress Notes (Signed)
MD Elisabeth Pigeon was made aware that pt ok to home per MD Thad Ranger

## 2015-10-18 NOTE — Care Management (Addendum)
Patient states that he goes to [facility]. I have requested follow up through Rosanna Randy with [facility]. Apparently patient has received applications for Rx assist ance (Klonipin/depakote) in the past but still having trouble getting these Rx. This may be because the are mental health Rx. Patient advised to take Rx to [facility] to see if they can assist however they may have to order Rx. I have also advised patient to follow through with the recommendations he has received in the past.   10/18/15 at 1419: Patient is open to [facility] per Rosanna Randy.

## 2015-10-18 NOTE — Progress Notes (Signed)
A & O. Pt complained of a HA and received meds. IV and tele. Prescriptions given to pt. Discharge instructions given to pt. Pt has no further concerns at this time.

## 2015-10-21 ENCOUNTER — Emergency Department
Admission: EM | Admit: 2015-10-21 | Discharge: 2015-10-21 | Disposition: A | Discharge status: Home / Self Care | Payer: Medicaid Other | Attending: Emergency Medicine | Admitting: Emergency Medicine

## 2015-10-21 ENCOUNTER — Encounter: Payer: Self-pay | Admitting: Emergency Medicine

## 2015-10-21 ENCOUNTER — Emergency Department: Payer: Medicaid Other

## 2015-10-21 DIAGNOSIS — G8929 Other chronic pain: Secondary | ICD-10-CM | POA: Insufficient documentation

## 2015-10-21 DIAGNOSIS — F17210 Nicotine dependence, cigarettes, uncomplicated: Secondary | ICD-10-CM | POA: Insufficient documentation

## 2015-10-21 DIAGNOSIS — R51 Headache: Secondary | ICD-10-CM | POA: Insufficient documentation

## 2015-10-21 DIAGNOSIS — Z79899 Other long term (current) drug therapy: Secondary | ICD-10-CM | POA: Insufficient documentation

## 2015-10-21 DIAGNOSIS — G40909 Epilepsy, unspecified, not intractable, without status epilepticus: Secondary | ICD-10-CM | POA: Insufficient documentation

## 2015-10-21 DIAGNOSIS — R531 Weakness: Secondary | ICD-10-CM | POA: Insufficient documentation

## 2015-10-21 LAB — CBC WITH DIFFERENTIAL/PLATELET
BASOS ABS: 0 10*3/uL (ref 0–0.1)
BASOS PCT: 1 %
EOS ABS: 0.2 10*3/uL (ref 0–0.7)
EOS PCT: 6 %
HCT: 41.8 % (ref 40.0–52.0)
HEMOGLOBIN: 14.2 g/dL (ref 13.0–18.0)
LYMPHS ABS: 1.5 10*3/uL (ref 1.0–3.6)
Lymphocytes Relative: 40 %
MCH: 29 pg (ref 26.0–34.0)
MCHC: 34 g/dL (ref 32.0–36.0)
MCV: 85.3 fL (ref 80.0–100.0)
Monocytes Absolute: 0.4 10*3/uL (ref 0.2–1.0)
Monocytes Relative: 9 %
NEUTROS PCT: 44 %
Neutro Abs: 1.7 10*3/uL (ref 1.4–6.5)
PLATELETS: 229 10*3/uL (ref 150–440)
RBC: 4.9 MIL/uL (ref 4.40–5.90)
RDW: 12.6 % (ref 11.5–14.5)
WBC: 3.9 10*3/uL (ref 3.8–10.6)

## 2015-10-21 LAB — URINE DRUG SCREEN, QUALITATIVE (ARMC ONLY)
Amphetamines, Ur Screen: NOT DETECTED
Barbiturates, Ur Screen: NOT DETECTED
Benzodiazepine, Ur Scrn: NOT DETECTED
Cannabinoid 50 Ng, Ur ~~LOC~~: NOT DETECTED
Cocaine Metabolite,Ur ~~LOC~~: NOT DETECTED
MDMA (Ecstasy)Ur Screen: NOT DETECTED
Methadone Scn, Ur: NOT DETECTED
Opiate, Ur Screen: NOT DETECTED
Phencyclidine (PCP) Ur S: NOT DETECTED
Tricyclic, Ur Screen: NOT DETECTED

## 2015-10-21 LAB — URINALYSIS COMPLETE WITH MICROSCOPIC (ARMC ONLY)
Bacteria, UA: NONE SEEN
Bilirubin Urine: NEGATIVE
Glucose, UA: NEGATIVE mg/dL
Hgb urine dipstick: NEGATIVE
Leukocytes, UA: NEGATIVE
Nitrite: NEGATIVE
Protein, ur: NEGATIVE mg/dL
Specific Gravity, Urine: 1.021 (ref 1.005–1.030)
pH: 6 (ref 5.0–8.0)

## 2015-10-21 LAB — COMPREHENSIVE METABOLIC PANEL WITH GFR
ALT: 14 U/L — ABNORMAL LOW (ref 17–63)
AST: 21 U/L (ref 15–41)
Albumin: 4.6 g/dL (ref 3.5–5.0)
Alkaline Phosphatase: 49 U/L (ref 38–126)
Anion gap: 10 (ref 5–15)
BUN: 8 mg/dL (ref 6–20)
CO2: 27 mmol/L (ref 22–32)
Calcium: 9.4 mg/dL (ref 8.9–10.3)
Chloride: 103 mmol/L (ref 101–111)
Creatinine, Ser: 0.92 mg/dL (ref 0.61–1.24)
GFR calc Af Amer: >60 mL/min
GFR calc non Af Amer: >60 mL/min
Glucose, Bld: 104 mg/dL — ABNORMAL HIGH (ref 65–99)
Potassium: 3.2 mmol/L — ABNORMAL LOW (ref 3.5–5.1)
Sodium: 140 mmol/L (ref 135–145)
Total Bilirubin: 0.5 mg/dL (ref 0.3–1.2)
Total Protein: 7.6 g/dL (ref 6.5–8.1)

## 2015-10-21 LAB — RAPID HIV SCREEN (HIV 1/2 AB+AG)
HIV 1/2 Antibodies: NONREACTIVE
HIV-1 P24 Antigen - HIV24: NONREACTIVE

## 2015-10-21 LAB — AMMONIA: Ammonia: 70 umol/L — ABNORMAL HIGH (ref 9–35)

## 2015-10-21 LAB — ETHANOL: Alcohol, Ethyl (B): <5 mg/dL (ref <5)

## 2015-10-21 LAB — VALPROIC ACID LEVEL, FREE: Valproic Acid, Free: 80.6 ug/mL — ABNORMAL HIGH (ref 6.0–22.0)

## 2015-10-21 LAB — LIPASE, BLOOD: LIPASE: 31 U/L (ref 11–51)

## 2015-10-21 LAB — VALPROIC ACID LEVEL: Valproic Acid Lvl: 78 ug/mL (ref 50.0–100.0)

## 2015-10-21 NOTE — ED Provider Notes (Signed)
-----------------------------------------   9:55 AM on 10/21/2015 -----------------------------------------   Blood pressure 116/76, pulse 96, temperature 97.2 F (36.2 C), temperature source Oral, resp. rate 16, height 6' (1.829 m), weight 155 lb (70.308 kg), SpO2 99 %.  Assuming care from Dr. York Cerise  In short, [name] is a 35 y.o. male with a chief complaint of Headache .  Refer to the original H&P for additional details.  The current plan of care is to *follow results of his MRI. I also added an HIV screen which was negative EXAM: MRI HEAD WITHOUT CONTRAST  MRA HEAD WITHOUT CONTRAST  TECHNIQUE: Multiplanar, multiecho pulse sequences of the brain and surrounding structures were obtained without intravenous contrast. Angiographic images of the head were obtained using MRA technique without contrast.  COMPARISON: CT 10/16/2015. No previous MRI exam.  FINDINGS: MRI HEAD FINDINGS  The brain does not show evidence of old or acute infarction, mass lesion, hemorrhage, hydrocephalus or extra-axial collection. There is slight volume loss of the mesial temporal lobe on the right compared to the left with slight prominence of the choroid fissure. 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 No pituitary mass. No inflammatory sinus disease. No skull or skullbase lesion.  MRA HEAD FINDINGS  Some motion degradation. Both internal carotid arteries are widely patent into the brain. The anterior and middle cerebral vessels are normal without proximal stenosis, aneurysm or vascular malformation. Both vertebral arteries are widely patent to the basilar. No basilar stenosis. Posterior circulation branch vessels appear normal. MRA HEAD FINDINGS  Some motion degradation. Both internal carotid arteries are widely patent into the brain. The anterior and middle cerebral vessels are normal without proximal stenosis, aneurysm or vascular malformation. Both vertebral  arteries are widely patent to the basilar. No basilar stenosis. Posterior circulation branch vessels appear normal.  IMPRESSION: No acute intracranial finding.  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  Normal intracranial MR angiography of the large and medium size vessels. Some motion degradation.  IMPRESSION: No acute intracranial finding.  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  Normal intracranial MR angiography of the large and medium size vessels. Some motion degradation.  ED course MRI dated identified a source for his seizures with a mesial temporal lobe isolated. Can be a cause for her temporal lobe recurrent seizures. The patient otherwise remained seizure free and I felt identified the source of his seizures did not really change his disposition and only completes the evaluation. Patient otherwise has no neurologic deficits and has a known history of a seizure disorder with recurrent headaches. The patient was discharged to follow-up with his neurologist. He has upcoming appointment. He is been advised continue with his current prescription regimen.  Jennye Moccasin, MD 10/21/15 [phone]

## 2015-10-21 NOTE — ED Notes (Signed)
Pt arrived by EMS with c/o headache "all day." EMS reports pt was on side of road, and not very verbal with EMS when asked questions. Pt has a hx of seizures and states he has been taking his Depakote regularly. On assessment pts hands are cold to the touch and when asked questions, pt is slow to respond.

## 2015-10-21 NOTE — ED Notes (Signed)
Pt taken to MRI via wheelchair by this RN

## 2015-10-21 NOTE — ED Provider Notes (Signed)
[facility] Emergency Department Provider Note  ____________________________________________  Time seen: Approximately 3:02 AM  I have reviewed the triage vital signs and the nursing notes.   HISTORY  Chief Complaint Headache    HPI Keith Chung is a 35 y.o. male with a past medical history of epilepsy and a recent hospitalization at this facility for encephalopathy with a significantly elevated Depakote level and an elevated ammonia level.  He presents tonight by EMS for altered mental status, possible seizure, and chronic headache.  The patient is minimally cooperative with the examination and history which is consistent with his last presentation.  He reports that he has a new headache, but when I pointed out that his headache was present during his hospitalization, he admitted that he always has a headache, but it is getting worse".  He describes it as severe, global, and nothing makes it better or worse.  He was picked up tonight by EMS when he was found down on the side of the road.  He apparently had been walking down the road at night in the cold.  I asked him why he was doing that and he said that he just went out for a walk to get some Hot Tamales.  He thinks that he may have had a seizure and bit his lip although there is no evidence of that.  Initially when he came to the emergency department he would not speak but now he is speaking although minimally cooperative.  He endorses generalized pain over his whole body and some nausea.  He denies visual changes.  He states that he has been taking his newly reduced Depakote prescription and has Keppra as he was supposed to. He denies alcohol and drug use.   Past Medical History  Diagnosis Date  . Seizures [facility])     Patient Active Problem List   Diagnosis Date Noted  . Valproic acid toxicity   . Acute delirium 10/17/2015  . Postictal state (HCC) 10/17/2015  . Epilepsy (HCC) 10/17/2015  . Overdose  10/16/2015    Past Surgical History  Procedure Laterality Date  . Vasectomy Bilateral   . Hand surgery      right hand surgery    Current Outpatient Rx  Name  Route  Sig  Dispense  Refill  . divalproex (DEPAKOTE) 250 MG DR tablet   Oral   Take 1 tablet (250 mg total) by mouth 2 (two) times daily.   60 tablet   0   . HYDROcodone-acetaminophen (NORCO/VICODIN) 5-325 MG tablet   Oral   Take 1 tablet by mouth every 4 (four) hours as needed for moderate pain. Patient not taking: Reported on 09/23/2015   12 tablet   0   . levETIRAcetam (KEPPRA) 500 MG tablet   Oral   Take 3 tablets (1,500 mg total) by mouth 2 (two) times daily.   180 tablet   2   . pantoprazole (PROTONIX) 20 MG tablet   Oral   Take 1 tablet (20 mg total) by mouth daily.   30 tablet   1     Allergies Pollen extract  Family History  Problem Relation Age of Onset  . Hypertension Mother   . Diabetes Mellitus II Maternal Grandmother     Social History Social History  Substance Use Topics  . Smoking status: Light Tobacco Smoker -- 0.30 packs/day    Types: Cigarettes    Last Attempt to Quit: 07/18/2014  . Smokeless tobacco: None  Comment: stopped 4 months ago  . Alcohol Use: No     Comment: socially    Review of Systems Constitutional: No fever/chills Eyes: No visual changes. ENT: No sore throat. Cardiovascular: Denies chest pain. Respiratory: Denies shortness of breath. Gastrointestinal: No abdominal pain.  nausea, no vomiting.  No diarrhea.  No constipation. Genitourinary: Negative for dysuria. Musculoskeletal: Negative for back pain. Skin: Negative for rash. Neurological: "Everything hurts".  Generalized weakness.  Possible seizure.  Severe global headache.  10-point ROS otherwise negative.  ____________________________________________   PHYSICAL EXAM:  VITAL SIGNS: ED Triage Vitals  Enc Vitals Group     BP 10/21/15 0150 136/91 mmHg     Pulse Rate 10/21/15 0150 82     Resp  10/21/15 0150 14     Temp 10/21/15 0150 97.2 F (36.2 C)     Temp Source 10/21/15 0150 Oral     SpO2 10/21/15 0148 99 %     Weight 10/21/15 0150 155 lb (70.308 kg)     Height 10/21/15 0150 6' (1.829 m)     Head Cir --      Peak Flow --      Pain Score --      Pain Loc --      Pain Edu? --      Excl. in GC? --     Constitutional: Appears not exactly somnolent but not completely alert either.  Will respond appropriately to questions with insistent questioning on my part.  His eyes drift up and closed again as soon as he finishes a statement. Eyes: Conjunctivae are normal. PERRL. EOMI. Head: Atraumatic. Nose: No congestion/rhinnorhea. Mouth/Throat: Mucous membranes are moist.  Oropharynx non-erythematous.  No evidence of intraoral injury. Neck: No stridor.  No cervical spine tenderness to palpation.  No meningismus Cardiovascular: Normal rate, regular rhythm. Grossly normal heart sounds.  Good peripheral circulation. Respiratory: Normal respiratory effort.  No retractions. Lungs CTAB. Gastrointestinal: Soft and nontender. No distention. No abdominal bruits. No CVA tenderness. Musculoskeletal: No lower extremity tenderness nor edema.  No joint effusions. Neurologic:  Minimal speech, but no obvious abnormalities.  The patient has to be coached extensively to put forth any effort on neurological exam, but when he finally does so there is no appreciable focal neural deficit, just minimal effort.  No cranial nerve abnormalities are appreciated. Skin:  Skin is warm, dry and intact. No rash noted. Psychiatric: Mood and affect are flat and uncooperative.  ____________________________________________   LABS (all labs ordered are listed, but only abnormal results are displayed)  Labs Reviewed  VALPROIC ACID LEVEL  COMPREHENSIVE METABOLIC PANEL  LIPASE, BLOOD  AMMONIA  ETHANOL  URINE DRUG SCREEN, QUALITATIVE ([facility] ONLY)  URINALYSIS COMPLETEWITH MICROSCOPIC ([facility] ONLY)  CBC WITH  DIFFERENTIAL/PLATELET   ____________________________________________  EKG  None ____________________________________________  RADIOLOGY   No results found.  ____________________________________________   PROCEDURES  Procedure(s) performed: None  Critical Care performed: No ____________________________________________   INITIAL IMPRESSION / ASSESSMENT AND PLAN / ED COURSE  Pertinent labs & imaging results that were available during my care of the patient were reviewed by me and considered in my medical decision making (see chart for details).  This patient is very difficult to assess in the emergent setting.  He appears encephalopathic versus postictal but upon review of his consultant notes (neurology, psychiatry) and a hospitalization notes from his recent inpatient stay his current presentation is consistent with his last admission.  At that time he had an elevated valproic acid level and an elevated  ammonia.  There was some question about whether he intentionally overdosed on the Depakote but psychiatry felt that this was unlikely.  However he presents tonight and seems acutely worse again more consistent with his presentation before.  I do not feel there is any indication for repeat noncontrast head CT because he had one within the last week and it was unremarkable.  I will proceed with a lab workup to evaluate whether or not his Depakote level has again increased and if he has a normal ammonia level.  There is no indication of suicidality at this time but we will watch him as we proceed with the workup.  ----------------------------------------- 7:14 AM on 10/21/2015 -----------------------------------------  Workup is not revealing.  Extensive review of the medical records indicates 92 ED visits in the last six months.  Symptoms seem to worsening (specifically the severity of the headaches).  According to the records, he has never had MR imaging of his brain.  In an  attempt to eliminate any emergent medication we will obtain MR brain w/o contrast and MR angiogram head w/o contrast (I spoke with the on-call radiologist to make sure the proper studies were ordered).  If these are unremarkable, the patient may be discharged for outpatient follow up, as our facility has nothing else to offer for his chronic headaches, seizure disorder, etc.    Transferring ED care to Dr. Huel Cote.  ____________________________________________     NEW MEDICATIONS STARTED DURING THIS VISIT:  New Prescriptions   No medications on file     Loleta Rose, MD 10/21/15 [phone]

## 2015-10-21 NOTE — Discharge Instructions (Signed)
Epilepsy °People with epilepsy have times when they shake and jerk uncontrollably (seizures). This happens when there is a sudden change in brain function. Epilepsy may have many possible causes. Anything that disturbs the normal pattern of brain cell activity can lead to seizures. °HOME CARE  °· Follow your doctor's instructions about driving and safety during normal activities. °· Get enough sleep. °· Only take medicine as told by your doctor. °· Avoid things that you know can cause you to have seizures (triggers). °· Write down when your seizures happen and what you remember about each seizure. Write down anything you think may have caused the seizure to happen. °· Tell the people you live and work with that you have seizures. Make sure they know how to help you. They should: °¨ Cushion your head and body. °¨ Turn you on your side. °¨ Not restrain you. °¨ Not place anything inside your mouth. °¨ Call for local emergency medical help if there is any question about what has happened. °· Keep all follow-up visits with your doctor. This is very important. °GET HELP IF: °· You get an infection or start to feel sick. You may have more seizures when you are sick. °· You are having seizures more often. °· Your seizure pattern is changing. °GET HELP RIGHT AWAY IF:  °· A seizure does not stop after a few seconds or minutes. °· A seizure causes you to have trouble breathing. °· A seizure gives you a very bad headache. °· A seizure makes you unable to speak or use a part of your body. °  °This information is not intended to replace advice given to you by your health care provider. Make sure you discuss any questions you have with your health care provider. °  °Document Released: 07/01/2009 Document Revised: 06/24/2013 Document Reviewed: 04/15/2013 °Elsevier Interactive Patient Education ©2016 [employer] ° °

## 2015-10-22 LAB — PROLACTIN: PROLACTIN: 15.5 ng/mL — AB (ref 4.0–15.2)

## 2015-10-23 ENCOUNTER — Emergency Department
Admission: EM | Admit: 2015-10-23 | Discharge: 2015-10-23 | Disposition: A | Discharge status: Home / Self Care | Payer: Medicaid Other | Attending: Emergency Medicine | Admitting: Emergency Medicine

## 2015-10-23 DIAGNOSIS — G40909 Epilepsy, unspecified, not intractable, without status epilepticus: Secondary | ICD-10-CM

## 2015-10-23 DIAGNOSIS — R1032 Left lower quadrant pain: Secondary | ICD-10-CM | POA: Diagnosis not present

## 2015-10-23 DIAGNOSIS — Z79899 Other long term (current) drug therapy: Secondary | ICD-10-CM | POA: Diagnosis not present

## 2015-10-23 DIAGNOSIS — F17210 Nicotine dependence, cigarettes, uncomplicated: Secondary | ICD-10-CM | POA: Diagnosis not present

## 2015-10-23 DIAGNOSIS — R569 Unspecified convulsions: Secondary | ICD-10-CM | POA: Diagnosis present

## 2015-10-23 LAB — CBC WITH DIFFERENTIAL/PLATELET
BASOS ABS: 0 10*3/uL (ref 0–0.1)
Basophils Relative: 2 %
EOS ABS: 0.2 10*3/uL (ref 0–0.7)
EOS PCT: 7 %
HCT: 38.7 % — ABNORMAL LOW (ref 40.0–52.0)
Hemoglobin: 13.1 g/dL (ref 13.0–18.0)
LYMPHS ABS: 1.4 10*3/uL (ref 1.0–3.6)
Lymphocytes Relative: 44 %
MCH: 28.9 pg (ref 26.0–34.0)
MCHC: 33.8 g/dL (ref 32.0–36.0)
MCV: 85.5 fL (ref 80.0–100.0)
Monocytes Absolute: 0.5 10*3/uL (ref 0.2–1.0)
Monocytes Relative: 15 %
Neutro Abs: 1 10*3/uL — ABNORMAL LOW (ref 1.4–6.5)
Neutrophils Relative %: 32 %
PLATELETS: 190 10*3/uL (ref 150–440)
RBC: 4.53 MIL/uL (ref 4.40–5.90)
RDW: 12.2 % (ref 11.5–14.5)
WBC: 3 10*3/uL — AB (ref 3.8–10.6)

## 2015-10-23 LAB — BASIC METABOLIC PANEL
Anion gap: 7 (ref 5–15)
BUN: 9 mg/dL (ref 6–20)
CO2: 28 mmol/L (ref 22–32)
Calcium: 9.2 mg/dL (ref 8.9–10.3)
Chloride: 104 mmol/L (ref 101–111)
Creatinine, Ser: 0.95 mg/dL (ref 0.61–1.24)
GFR calc Af Amer: >60 mL/min (ref >60)
Glucose, Bld: 85 mg/dL (ref 65–99)
POTASSIUM: 4.1 mmol/L (ref 3.5–5.1)
Sodium: 139 mmol/L (ref 135–145)

## 2015-10-23 LAB — ACETAMINOPHEN LEVEL

## 2015-10-23 LAB — URINE DRUG SCREEN, QUALITATIVE (ARMC ONLY)
AMPHETAMINES, UR SCREEN: NOT DETECTED
BARBITURATES, UR SCREEN: NOT DETECTED
BENZODIAZEPINE, UR SCRN: NOT DETECTED
CANNABINOID 50 NG, UR ~~LOC~~: NOT DETECTED
Cocaine Metabolite,Ur ~~LOC~~: NOT DETECTED
MDMA (Ecstasy)Ur Screen: NOT DETECTED
Methadone Scn, Ur: NOT DETECTED
OPIATE, UR SCREEN: NOT DETECTED
PHENCYCLIDINE (PCP) UR S: NOT DETECTED
Tricyclic, Ur Screen: NOT DETECTED

## 2015-10-23 LAB — URINALYSIS COMPLETE WITH MICROSCOPIC (ARMC ONLY)
BILIRUBIN URINE: NEGATIVE
Bacteria, UA: NONE SEEN
Glucose, UA: NEGATIVE mg/dL
HGB URINE DIPSTICK: NEGATIVE
KETONES UR: NEGATIVE mg/dL
LEUKOCYTES UA: NEGATIVE
NITRITE: NEGATIVE
PH: 7 (ref 5.0–8.0)
Protein, ur: NEGATIVE mg/dL
RBC / HPF: NONE SEEN RBC/hpf (ref 0–5)
SPECIFIC GRAVITY, URINE: 1.003 — AB (ref 1.005–1.030)
Squamous Epithelial / LPF: NONE SEEN

## 2015-10-23 LAB — SALICYLATE LEVEL

## 2015-10-23 LAB — ETHANOL

## 2015-10-23 MED ORDER — ACETAMINOPHEN 500 MG PO TABS
1000.0000 mg | ORAL_TABLET | Freq: Once | ORAL | Status: AC
  Administered 2015-10-23: 1000 mg via ORAL
  Filled 2015-10-23: qty 2

## 2015-10-23 MED ORDER — LEVETIRACETAM 500 MG PO TABS
1000.0000 mg | ORAL_TABLET | Freq: Once | ORAL | Status: AC
  Administered 2015-10-23: 1000 mg via ORAL
  Filled 2015-10-23: qty 2

## 2015-10-23 MED ORDER — LEVETIRACETAM 500 MG PO TABS: 1500.0000 mg | ORAL_TABLET | Freq: Two times a day (BID) | ORAL | Status: DC

## 2015-10-23 NOTE — ED Provider Notes (Signed)
[facility] Emergency Department Provider Note  ____________________________________________  Time seen: 12:40 PM on arrival by EMS  I have reviewed the triage vital signs and the nursing notes.   HISTORY  Chief Complaint Seizures    HPI Keith Chung is a 35 y.o. male is brought to the ED by EMS after he called 911 reporting that he had had a seizure. He has a long-standing history of epilepsy for which he takes Keppra and Depakote. The patient reports compliance with his medications but has had many previous presentations for seizures. Denies any trauma, current headache or vision changes, numbness tingling weakness or muscle skeletal complaints.     Past Medical History  Diagnosis Date  . Seizures [facility])      Patient Active Problem List   Diagnosis Date Noted  . Valproic acid toxicity   . Acute delirium 10/17/2015  . Postictal state (HCC) 10/17/2015  . Epilepsy (HCC) 10/17/2015  . Overdose 10/16/2015     Past Surgical History  Procedure Laterality Date  . Vasectomy Bilateral   . Hand surgery      right hand surgery     Current Outpatient Rx  Name  Route  Sig  Dispense  Refill  . divalproex (DEPAKOTE) 250 MG DR tablet   Oral   Take 1 tablet (250 mg total) by mouth 2 (two) times daily.   60 tablet   0   . levETIRAcetam (KEPPRA) 500 MG tablet   Oral   Take 3 tablets (1,500 mg total) by mouth 2 (two) times daily.   180 tablet   2   . pantoprazole (PROTONIX) 20 MG tablet   Oral   Take 1 tablet (20 mg total) by mouth daily.   30 tablet   1      Allergies Pollen extract   Family History  Problem Relation Age of Onset  . Hypertension Mother   . Diabetes Mellitus II Maternal Grandmother     Social History Social History  Substance Use Topics  . Smoking status: Light Tobacco Smoker -- 0.30 packs/day    Types: Cigarettes    Last Attempt to Quit: 07/18/2014  . Smokeless tobacco: None     Comment: stopped 4 months  ago  . Alcohol Use: No     Comment: socially    Review of Systems  Constitutional:   No fever or chills. No weight changes Eyes:   No blurry vision or double vision.  ENT:   No sore throat. Cardiovascular:   No chest pain. Respiratory:   No dyspnea or cough. Gastrointestinal:   Negative for abdominal pain, vomiting and diarrhea.  No BRBPR or melena. Genitourinary:   Negative for dysuria, urinary retention, bloody urine, or difficulty urinating. Musculoskeletal:   Negative for back pain. No joint swelling or pain. Skin:   Negative for rash. Neurological:   Negative for headaches, focal weakness or numbness. Psychiatric:  No anxiety or depression.   Endocrine:  No hot/cold intolerance, changes in energy, or sleep difficulty.  10-point ROS otherwise negative.  ____________________________________________   PHYSICAL EXAM:  VITAL SIGNS: ED Triage Vitals  Enc Vitals Group     BP 10/23/15 1243 132/85 mmHg     Pulse Rate 10/23/15 1243 70     Resp --      Temp 10/23/15 1243 97.8 F (36.6 C)     Temp Source 10/23/15 1243 Oral     SpO2 10/23/15 1243 97 %     Weight 10/23/15 1243 155 lb (  70.308 kg)     Height 10/23/15 1243  (1.854 m)     Head Cir --      Peak Flow --      Pain Score --      Pain Loc --      Pain Edu? --      Excl. in GC? --     Vital signs reviewed, nursing assessments reviewed.   Constitutional:   Alert and oriented. Well appearing and in no distress. Eyes:   No scleral icterus. No conjunctival pallor. PERRL. EOMI ENT   Head:   Normocephalic and atraumatic.   Nose:   No congestion/rhinnorhea. No septal hematoma   Mouth/Throat:   MMM, no pharyngeal erythema. No peritonsillar mass. No uvula shift. No tongue abrasions or lacerations or other intraoral injuries   Neck:   No stridor. No SubQ emphysema. No meningismus. Hematological/Lymphatic/Immunilogical:   No cervical lymphadenopathy. Cardiovascular:   RRR. Normal and symmetric distal  pulses are present in all extremities. No murmurs, rubs, or gallops. Respiratory:   Normal respiratory effort without tachypnea nor retractions. Breath sounds are clear and equal bilaterally. No wheezes/rales/rhonchi. Gastrointestinal:   Soft with significant left lower quadrant tenderness.. No distention. There is no CVA tenderness.  No rebound, rigidity, or guarding. There is a small inguinal lymph node that is tender to the touch on the left. Patient refuses genital exam but denies any swelling or discharge or lesions. Genitourinary:   deferred Musculoskeletal:   Nontender with normal range of motion in all extremities. No joint effusions.  No lower extremity tenderness.  No edema. Neurologic:   Normal speech and language.  CN 2-10 normal. Motor grossly intact. No pronator drift.  Normal gait. No gross focal neurologic deficits are appreciated.  Skin:    Skin is warm, dry and intact. No rash noted.  No petechiae, purpura, or bullae. Psychiatric:   Mood and affect are normal. Speech and behavior are normal. Patient exhibits appropriate insight and judgment.  ____________________________________________    LABS (pertinent positives/negatives) (all labs ordered are listed, but only abnormal results are displayed) Labs Reviewed  BASIC METABOLIC PANEL  ETHANOL  SALICYLATE LEVEL  ACETAMINOPHEN LEVEL  CBC WITH DIFFERENTIAL/PLATELET   ____________________________________________   EKG  Interpreted by me Normal sinus rhythm rate of 74, normal axis and normal intervals. Normal QRS. Normal QRS and ST segments and T waves. There is no EKG evidence of acute ischemia, cardiac membrane instability, or pericarditis.  ____________________________________________    RADIOLOGY    ____________________________________________   PROCEDURES   ____________________________________________   INITIAL IMPRESSION / ASSESSMENT AND PLAN / ED COURSE  Pertinent labs & imaging results that were  available during my care of the patient were reviewed by me and considered in my medical decision making (see chart for details).  Patient presents for a seizure which was unwitnessed today. Has a long-standing history of epilepsy. Just had an MRI about 2 or 3 days ago as well as recent labs. We'll recheck labs today but I think that repeat neuroimaging is not going to be helpful today. We'll give him his Keppra and plan for outpatient follow-up if labs are okay and there are no recurrent episodes during his workup time here in the emergency department.  ----------------------------------------- 2:34 PM on 10/23/2015 -----------------------------------------  Workup negative. We'll discharge home. No recurrent seizure activity in the emergency department.     ____________________________________________   FINAL CLINICAL IMPRESSION(S) / ED DIAGNOSES  Final diagnoses:  Nonintractable epilepsy without status epilepticus, unspecified  epilepsy type [facility])      Sharman Cheek, MD 10/23/15 1434

## 2015-10-23 NOTE — ED Notes (Signed)
Pt came to ED via EMS post seizure activity. Pt has history of seizures. Pt reports being in the park while having a seizure. Pt was able to call 911. While on the phone, pt stopped talking/ possibly had another seizure. Pts blood sugar 109 w/ EMS. Vital signs stable.

## 2015-10-23 NOTE — Discharge Instructions (Signed)

## 2015-10-25 ENCOUNTER — Telehealth: Payer: Self-pay | Admitting: *Deleted

## 2015-10-25 ENCOUNTER — Ambulatory Visit: Payer: MEDICAID | Admitting: Neurology

## 2015-10-25 NOTE — Telephone Encounter (Signed)
No showed new patient appt 

## 2015-10-26 ENCOUNTER — Encounter: Payer: Self-pay | Admitting: Neurology

## 2015-11-02 ENCOUNTER — Other Ambulatory Visit: Payer: Self-pay

## 2015-11-02 LAB — LIPID PANEL
CHOLESTEROL: 150 mg/dL (ref 0–200)
HDL: 54 mg/dL (ref 35–70)
LDL Cholesterol: 82 mg/dL
Triglycerides: 69 mg/dL (ref 40–160)

## 2015-11-02 LAB — BASIC METABOLIC PANEL
BUN: 12 mg/dL (ref 4–21)
CREATININE: 1.2 mg/dL (ref 0.6–1.3)
Glucose: 107 mg/dL
SODIUM: 144 mmol/L (ref 137–147)

## 2015-11-02 LAB — HEPATIC FUNCTION PANEL: Bilirubin, Total: 0.2 mg/dL

## 2015-11-02 LAB — TSH: TSH: 0.93 u[IU]/mL (ref 0.41–5.90)

## 2015-11-09 ENCOUNTER — Ambulatory Visit: Payer: Self-pay | Admitting: Internal Medicine

## 2016-01-05 ENCOUNTER — Emergency Department
Admission: EM | Admit: 2016-01-05 | Discharge: 2016-01-05 | Disposition: A | Discharge status: Home / Self Care | Payer: Medicaid Other | Attending: Emergency Medicine | Admitting: Emergency Medicine

## 2016-01-05 ENCOUNTER — Emergency Department: Payer: Medicaid Other

## 2016-01-05 DIAGNOSIS — F1210 Cannabis abuse, uncomplicated: Secondary | ICD-10-CM | POA: Diagnosis not present

## 2016-01-05 DIAGNOSIS — R569 Unspecified convulsions: Secondary | ICD-10-CM | POA: Diagnosis not present

## 2016-01-05 DIAGNOSIS — F17210 Nicotine dependence, cigarettes, uncomplicated: Secondary | ICD-10-CM | POA: Insufficient documentation

## 2016-01-05 LAB — BASIC METABOLIC PANEL
ANION GAP: 6 (ref 5–15)
BUN: 7 mg/dL (ref 6–20)
CALCIUM: 9.1 mg/dL (ref 8.9–10.3)
CHLORIDE: 104 mmol/L (ref 101–111)
CO2: 31 mmol/L (ref 22–32)
CREATININE: 1.14 mg/dL (ref 0.61–1.24)
GFR calc Af Amer: >60 mL/min (ref >60)
GFR calc non Af Amer: >60 mL/min (ref >60)
GLUCOSE: 100 mg/dL — AB (ref 65–99)
Potassium: 3.8 mmol/L (ref 3.5–5.1)
Sodium: 141 mmol/L (ref 135–145)

## 2016-01-05 LAB — CBC
HEMATOCRIT: 35 % — AB (ref 40.0–52.0)
HEMOGLOBIN: 12.2 g/dL — AB (ref 13.0–18.0)
MCH: 29.5 pg (ref 26.0–34.0)
MCHC: 35 g/dL (ref 32.0–36.0)
MCV: 84.2 fL (ref 80.0–100.0)
Platelets: 209 10*3/uL (ref 150–440)
RBC: 4.15 MIL/uL — ABNORMAL LOW (ref 4.40–5.90)
RDW: 13.1 % (ref 11.5–14.5)
WBC: 4 10*3/uL (ref 3.8–10.6)

## 2016-01-05 LAB — URINE DRUG SCREEN, QUALITATIVE (ARMC ONLY)
AMPHETAMINES, UR SCREEN: NOT DETECTED
BENZODIAZEPINE, UR SCRN: NOT DETECTED
Barbiturates, Ur Screen: NOT DETECTED
COCAINE METABOLITE, UR ~~LOC~~: NOT DETECTED
Cannabinoid 50 Ng, Ur ~~LOC~~: NOT DETECTED
MDMA (Ecstasy)Ur Screen: NOT DETECTED
METHADONE SCREEN, URINE: NOT DETECTED
OPIATE, UR SCREEN: NOT DETECTED
PHENCYCLIDINE (PCP) UR S: NOT DETECTED
Tricyclic, Ur Screen: NOT DETECTED

## 2016-01-05 LAB — TROPONIN I: Troponin I: <0.03 ng/mL (ref <0.031)

## 2016-01-05 LAB — VALPROIC ACID LEVEL: Valproic Acid Lvl: 85 ug/mL (ref 50.0–100.0)

## 2016-01-05 MED ORDER — ACETAMINOPHEN 500 MG PO TABS
1000.0000 mg | ORAL_TABLET | ORAL | Status: AC
  Administered 2016-01-05: 1000 mg via ORAL
  Filled 2016-01-05: qty 2

## 2016-01-05 NOTE — ED Notes (Signed)
Pt called EMS stating that " I felt like I was about to have another seizure" pt states he was released from prison on the 9th and had approx 7 seizures while there

## 2016-01-05 NOTE — Discharge Instructions (Signed)
You have been seen in the emergency department today for a seizure.  Your workup today including labs are within normal limits.  Please follow up with your doctor/neurologist as soon as possible regarding today's emergency department visit and your likely seizure.  As we have discussed it is very important that you do not drive until you have been seen and cleared by your neurologist. Continue your  Current medications.  Please drink plenty of fluids, get plenty of sleep and avoid any alcohol or drug use Please return to the emergency department if you have any further seizures which do not respond to medications, or for any other symptoms per se concerning for yourself.   Seizure, Adult A seizure is abnormal electrical activity in the brain. Seizures usually last from 30 seconds to 2 minutes. There are various types of seizures. Before a seizure, you may have a warning sensation (aura) that a seizure is about to occur. An aura may include the following symptoms:   Fear or anxiety.  Nausea.  Feeling like the room is spinning (vertigo).  Vision changes, such as seeing flashing lights or spots. Common symptoms during a seizure include:  A change in attention or behavior (altered mental status).  Convulsions with rhythmic jerking movements.  Drooling.  Rapid eye movements.  Grunting.  Loss of bladder and bowel control.  Bitter taste in the mouth.  Tongue biting. After a seizure, you may feel confused and sleepy. You may also have an injury resulting from convulsions during the seizure. HOME CARE INSTRUCTIONS   If you are given medicines, take them exactly as prescribed by your health care provider.  Keep all follow-up appointments as directed by your health care provider.  Do not swim or drive or engage in risky activity during which a seizure could cause further injury to you or others until your health care provider says it is OK.  Get adequate rest.  Teach friends and  family what to do if you have a seizure. They should:  Lay you on the ground to prevent a fall.  Put a cushion under your head.  Loosen any tight clothing around your neck.  Turn you on your side. If vomiting occurs, this helps keep your airway clear.  Stay with you until you recover.  Know whether or not you need emergency care. SEEK IMMEDIATE MEDICAL CARE IF:  The seizure lasts longer than 5 minutes.  The seizure is severe or you do not wake up immediately after the seizure.  You have an altered mental status after the seizure.  You are having more frequent or worsening seizures. Someone should drive you to the emergency department or call local emergency services (911 in [place] MAKE SURE YOU:  Understand these instructions.  Will watch your condition.  Will get help right away if you are not doing well or get worse.   This information is not intended to replace advice given to you by your health care provider. Make sure you discuss any questions you have with your health care provider.   Document Released: 08/31/2000 Document Revised: 09/24/2014 Document Reviewed: 04/15/2013 Elsevier Interactive Patient Education [employer]2016 [employer] Inc[employer]

## 2016-01-05 NOTE — ED Notes (Signed)
This RN got pt up from bed and assisted in ambulating. Pt had no difficulty walking

## 2016-01-05 NOTE — ED Provider Notes (Signed)
[facility] Emergency Department Provider Note  ____________________________________________  Time seen: Approximately 11:57 AM  I have reviewed the triage vital signs and the nursing notes.   HISTORY  Chief Complaint Seizures    HPI Keith Chung is a 35 y.o. male previous history of epilepsy, overdose, and toxic valproic acid level.  Patient reports that he was incarcerated and got out about a week and a half ago. He reports that he had seizures while in jail, and they adjusted medicines he is currently on Depakote and Keppra.  He reports that he is using his seizure medicines some, but he is not able to give me a very clear regimen, and it sounds as though he is likely not taking them as regularly as prescribed and but he can't really recall very well, states he thinks he takes most of them about once or twice a day.  He denies any overdose. Denies why to harm himself or anyone else.  States he was walking outside today, when he "had a seizure" awakening somewhat confused and having a mild headache. Resources have been many at times and he has seizures. Denies any fevers chills recent illness chest pain or trouble breathing.   He is not having neck pain and denies any injury except for headache after the "seizure". It was not witnessed.    Past Medical History  Diagnosis Date  . Seizures [facility](HCC)     Patient Active Problem List   Diagnosis Date Noted  . Valproic acid toxicity   . Acute delirium 10/17/2015  . Postictal state (HCC) 10/17/2015  . Epilepsy (HCC) 10/17/2015  . Overdose 10/16/2015    Past Surgical History  Procedure Laterality Date  . Vasectomy Bilateral   . Hand surgery      right hand surgery    Current Outpatient Rx  Name  Route  Sig  Dispense  Refill  . divalproex (DEPAKOTE) 250 MG DR tablet   Oral   Take 1 tablet (250 mg total) by mouth 2 (two) times daily.   60 tablet   0   . levETIRAcetam (KEPPRA) 500 MG tablet  Oral   Take 3 tablets (1,500 mg total) by mouth 2 (two) times daily.   180 tablet   2   . pantoprazole (PROTONIX) 20 MG tablet   Oral   Take 1 tablet (20 mg total) by mouth daily.   30 tablet   1     Allergies Pollen extract  Family History  Problem Relation Age of Onset  . Hypertension Mother   . Diabetes Mellitus II Maternal Grandmother     Social History Social History  Substance Use Topics  . Smoking status: Light Tobacco Smoker -- 0.30 packs/day    Types: Cigarettes    Last Attempt to Quit: 07/18/2014  . Smokeless tobacco: None     Comment: stopped 4 months ago  . Alcohol Use: No     Comment: socially    Review of Systems Constitutional: No fever/chills Eyes: No visual changes. ENT: No sore throat. Cardiovascular: Denies chest pain. Respiratory: Denies shortness of breath. Gastrointestinal: No abdominal pain.  No nausea, no vomiting.  No diarrhea.  No constipation. Genitourinary: Negative for dysuria. Musculoskeletal: Negative for back pain. Skin: Negative for rash. Neurological: Negative for  focal weakness or numbness.Positive for headache.  10-point ROS otherwise negative.  ____________________________________________   PHYSICAL EXAM:  VITAL SIGNS: ED Triage Vitals  Enc Vitals Group     BP 01/05/16 1130 141/94 mmHg  Pulse Rate 01/05/16 1130 90     Resp 01/05/16 1130 20     Temp 01/05/16 1127 98.6 F (37 C)     Temp src --      SpO2 01/05/16 1130 100 %     Weight --      Height --      Head Cir --      Peak Flow --      Pain Score 01/05/16 1127 10     Pain Loc --      Pain Edu? --      Excl. in GC? --    Constitutional: Alert and oriented. Well appearing and in no acute distress. Eyes: Conjunctivae are normal. PERRL. EOMI. Head: Atraumatic. Nose: No congestion/rhinnorhea. Mouth/Throat: Mucous membranes are moist.  Oropharynx non-erythematous. Neck: No stridor.  No cervical spine tenderness. Cardiovascular: Normal rate, regular  rhythm. Grossly normal heart sounds.  Good peripheral circulation. Respiratory: Normal respiratory effort.  No retractions. Lungs CTAB. Gastrointestinal: Soft and nontender. No distention. No abdominal bruits.  Musculoskeletal: No lower extremity tenderness nor edema.  No joint effusions. Neurologic:  Normal speech and language. No gross focal neurologic deficits are appreciated. Normal cranial nerve exam. 5 out of 5 strength in all extremities. No pronator drift. Normal finger-nose-finger. No nystagmus. Skin:  Skin is warm, dry and intact. No rash noted. Psychiatric: Mood and affect are normal. Speech and behavior are normal.  ____________________________________________   LABS (all labs ordered are listed, but only abnormal results are displayed)  Labs Reviewed  CBC - Abnormal; Notable for the following:    RBC 4.15 (*)    Hemoglobin 12.2 (*)    HCT 35.0 (*)    All other components within normal limits  BASIC METABOLIC PANEL - Abnormal; Notable for the following:    Glucose, Bld 100 (*)    All other components within normal limits  URINE DRUG SCREEN, QUALITATIVE ([facility] ONLY)  TROPONIN I  VALPROIC ACID LEVEL   ____________________________________________  EKG  Reviewed and interpreted by me at 11:30 AM Heart rate 80 QRS 140 QTc 420 Normal sinus rhythm, changes consistent with early repolarization seen in V2 and V3. There is no evidence of acute ST elevation MI, the patient's previous EKG is reviewed that shows similar J-point elevation and consistent with probable repolarization abnormality. No change from 10/23/2015. The patient is not noting any acute cardiac or pulmonary symptoms at this time. ____________________________________________  RADIOLOGY CT Head Wo Contrast (Final result) Result time: 01/05/16 13:33:44   Final result by Rad Results In Interface (01/05/16 13:33:44)   Narrative:   CLINICAL DATA: Dazed and confused. Pt called EMS stating that " I felt like I  was about to have another seizure" pt states he was released from prison on the 9th and had approx 7 seizures while there. Unsure of injury. Mildly responsive.  EXAM: CT HEAD WITHOUT CONTRAST  TECHNIQUE: Contiguous axial images were obtained from the base of the skull through the vertex without intravenous contrast.  COMPARISON: Brain MRI 10/21/2015, head CT 10/16/2015  FINDINGS: No acute intracranial hemorrhage. No focal mass lesion. No CT evidence of acute infarction. No midline shift or mass effect. No hydrocephalus. Basilar cisterns are patent.  Paranasal sinuses and mastoid air cells are clear. Orbits are clear.  IMPRESSION: Normal head CT. No change from prior.   Electronically Signed By: Genevive Bi M.D. On: 01/05/2016 13:33     ____________________________________________   PROCEDURES  Procedure(s) performed: None  Critical Care performed: No  ____________________________________________  INITIAL IMPRESSION / ASSESSMENT AND PLAN / ED COURSE  Pertinent labs & imaging results that were available during my care of the patient were reviewed by me and considered in my medical decision making (see chart for details).  Patient bent for potentially having a "seizure. He was walking and felt as though he was going to have a seizure, and reports he may have had one questionably hit his head is having a headache." EMS reports that they did not find any clear evidence that the had a seizure on arrival.  Patient reports he is compliant with his medicine. Rapid level is normal, unable to get an rapid level on Keppra but valproic acid normal.  Awake alert in no distress with normal and reassuring neurologic exam. No cardiopulmonary complaints. Discussed with Dr. Thad Ranger who advises no change to his current outpatient regimen.  ----------------------------------------- 2:26 PM on 01/05/2016 -----------------------------------------  Patient ambulating, no  distress. Currently reports he is ready to go home and has a ride.  Return precautions and treatment recommendations and follow-up discussed with the patient who is agreeable with the plan.    ____________________________________________   FINAL CLINICAL IMPRESSION(S) / ED DIAGNOSES  Final diagnoses:  Seizure (HCC)      Sharyn Creamer, MD 01/05/16 1426

## 2016-01-05 NOTE — ED Notes (Signed)
Patient transported to CT 

## 2016-01-06 ENCOUNTER — Emergency Department
Admission: EM | Admit: 2016-01-06 | Discharge: 2016-01-06 | Disposition: A | Discharge status: Home / Self Care | Payer: Medicaid Other | Attending: Emergency Medicine | Admitting: Emergency Medicine

## 2016-01-06 DIAGNOSIS — R569 Unspecified convulsions: Secondary | ICD-10-CM

## 2016-01-06 DIAGNOSIS — Z9119 Patient's noncompliance with other medical treatment and regimen: Secondary | ICD-10-CM | POA: Diagnosis not present

## 2016-01-06 DIAGNOSIS — G40909 Epilepsy, unspecified, not intractable, without status epilepticus: Secondary | ICD-10-CM | POA: Diagnosis not present

## 2016-01-06 DIAGNOSIS — Z91199 Patient's noncompliance with other medical treatment and regimen due to unspecified reason: Secondary | ICD-10-CM

## 2016-01-06 DIAGNOSIS — F17210 Nicotine dependence, cigarettes, uncomplicated: Secondary | ICD-10-CM | POA: Diagnosis not present

## 2016-01-06 LAB — URINALYSIS COMPLETE WITH MICROSCOPIC (ARMC ONLY)
Bacteria, UA: NONE SEEN
Bilirubin Urine: NEGATIVE
GLUCOSE, UA: NEGATIVE mg/dL
Hgb urine dipstick: NEGATIVE
Leukocytes, UA: NEGATIVE
NITRITE: NEGATIVE
PROTEIN: NEGATIVE mg/dL
SPECIFIC GRAVITY, URINE: 1.021 (ref 1.005–1.030)
Squamous Epithelial / LPF: NONE SEEN
pH: 6 (ref 5.0–8.0)

## 2016-01-06 LAB — CBC WITH DIFFERENTIAL/PLATELET
Basophils Absolute: 0 10*3/uL (ref 0–0.1)
Basophils Relative: 1 %
EOS ABS: 0.2 10*3/uL (ref 0–0.7)
Eosinophils Relative: 5 %
HEMATOCRIT: 37 % — AB (ref 40.0–52.0)
HEMOGLOBIN: 12.8 g/dL — AB (ref 13.0–18.0)
LYMPHS ABS: 1 10*3/uL (ref 1.0–3.6)
LYMPHS PCT: 26 %
MCH: 29.1 pg (ref 26.0–34.0)
MCHC: 34.7 g/dL (ref 32.0–36.0)
MCV: 84 fL (ref 80.0–100.0)
MONOS PCT: 5 %
Monocytes Absolute: 0.2 10*3/uL (ref 0.2–1.0)
NEUTROS PCT: 63 %
Neutro Abs: 2.4 10*3/uL (ref 1.4–6.5)
Platelets: 210 10*3/uL (ref 150–440)
RBC: 4.4 MIL/uL (ref 4.40–5.90)
RDW: 12.9 % (ref 11.5–14.5)
WBC: 3.8 10*3/uL (ref 3.8–10.6)

## 2016-01-06 LAB — COMPREHENSIVE METABOLIC PANEL
ALK PHOS: 50 U/L (ref 38–126)
ALT: 13 U/L — AB (ref 17–63)
ANION GAP: 5 (ref 5–15)
AST: 17 U/L (ref 15–41)
Albumin: 4.4 g/dL (ref 3.5–5.0)
BILIRUBIN TOTAL: 0.6 mg/dL (ref 0.3–1.2)
BUN: 14 mg/dL (ref 6–20)
CALCIUM: 9.1 mg/dL (ref 8.9–10.3)
CO2: 30 mmol/L (ref 22–32)
CREATININE: 1.11 mg/dL (ref 0.61–1.24)
Chloride: 105 mmol/L (ref 101–111)
Glucose, Bld: 116 mg/dL — ABNORMAL HIGH (ref 65–99)
Potassium: 4 mmol/L (ref 3.5–5.1)
Sodium: 140 mmol/L (ref 135–145)
TOTAL PROTEIN: 7.2 g/dL (ref 6.5–8.1)

## 2016-01-06 LAB — TROPONIN I

## 2016-01-06 LAB — VALPROIC ACID LEVEL: Valproic Acid Lvl: 71 ug/mL (ref 50.0–100.0)

## 2016-01-06 MED ORDER — SODIUM CHLORIDE 0.9 % IV SOLN
500.0000 mg | Freq: Once | INTRAVENOUS | Status: AC
  Administered 2016-01-06: 500 mg via INTRAVENOUS
  Filled 2016-01-06: qty 5

## 2016-01-06 MED ORDER — SODIUM CHLORIDE 0.9 % IV BOLUS (SEPSIS)
1000.0000 mL | Freq: Once | INTRAVENOUS | Status: AC
  Administered 2016-01-06: 1000 mL via INTRAVENOUS

## 2016-01-06 MED ORDER — SODIUM CHLORIDE 0.9 % IV SOLN: 500.0000 mg | Freq: Once | INTRAVENOUS | Status: DC

## 2016-01-06 NOTE — Consult Note (Signed)
Reason for Consult:Seizure Referring Physician: Quale  CC: Seizure  HPI: Keith Chung is an 35 y.o. male presenting after calling the police today because he felt that he was about to have a seizure.  Once the police arrived he was witnessed to have a 1-2 minute tonic clonic seizure.  Patient was brought to the ED at that time.  Patient was seen in the ED on yesterday when he felt that he may have had a seizure.  Patient reports that he is compliant with his medications but with further conversation is not taking his medications as prescribed.     Past Medical History  Diagnosis Date  . Seizures [facility])     Past Surgical History  Procedure Laterality Date  . Vasectomy Bilateral   . Hand surgery      right hand surgery    Family History  Problem Relation Age of Onset  . Hypertension Mother   . Diabetes Mellitus II Maternal Grandmother     Social History:  reports that he has been smoking Cigarettes.  He has been smoking about 0.30 packs per day. He does not have any smokeless tobacco history on file. He reports that he uses illicit drugs (Marijuana). He reports that he does not drink alcohol.  Allergies  Allergen Reactions  . Pollen Extract Other (See Comments)    Runny nose, watery eyes, sneezing    Medications: I have reviewed the patient's current medications. Prior to Admission:  Prior to Admission medications   Medication Sig Start Date End Date Taking? Authorizing Provider  divalproex (DEPAKOTE) 250 MG DR tablet Take 1 tablet (250 mg total) by mouth 2 (two) times daily. 10/18/15   Altamese Dilling, MD  levETIRAcetam (KEPPRA) 500 MG tablet Take 3 tablets (1,500 mg total) by mouth 2 (two) times daily. 10/23/15   Sharman Cheek, MD  pantoprazole (PROTONIX) 20 MG tablet Take 1 tablet (20 mg total) by mouth daily. 09/23/15 09/22/16  Arnaldo Natal, MD    ROS: History obtained from the patient  General ROS: negative for - chills, fatigue, fever, night sweats, weight  gain or weight loss Psychological ROS: memory difficulties Ophthalmic ROS: negative for - blurry vision, double vision, eye pain or loss of vision ENT ROS: negative for - epistaxis, nasal discharge, oral lesions, sore throat, tinnitus or vertigo Allergy and Immunology ROS: negative for - hives or itchy/watery eyes Hematological and Lymphatic ROS: negative for - bleeding problems, bruising or swollen lymph nodes Endocrine ROS: negative for - galactorrhea, hair pattern changes, polydipsia/polyuria or temperature intolerance Respiratory ROS: negative for - cough, hemoptysis, shortness of breath or wheezing Cardiovascular ROS: negative for - chest pain, dyspnea on exertion, edema or irregular heartbeat Gastrointestinal ROS: negative for - abdominal pain, diarrhea, hematemesis, nausea/vomiting or stool incontinence Genito-Urinary ROS: negative for - dysuria, hematuria, incontinence or urinary frequency/urgency Musculoskeletal ROS: negative for - joint swelling or muscular weakness Neurological ROS: as noted in HPI Dermatological ROS: negative for rash and skin lesion changes  Physical Examination: Blood pressure 114/70, pulse 52, temperature 97.9 F (36.6 C), temperature source Oral, resp. rate 0, height  (1.88 m), weight 77.52 kg (170 lb 14.4 oz), SpO2 99 %.  HEENT-  Normocephalic, no lesions, without obvious abnormality.  Normal external eye and conjunctiva.  Normal TM's bilaterally.  Normal auditory canals and external ears. Normal external nose, mucus membranes and septum.  Normal pharynx. Cardiovascular- S1, S2 normal, pulses palpable throughout   Lungs- chest clear, no wheezing, rales, normal symmetric air entry  Abdomen- soft, non-tender; bowel sounds normal; no masses,  no organomegaly Extremities- no edema Lymph-no adenopathy palpable Musculoskeletal-no joint tenderness, deformity or swelling Skin-warm and dry, no hyperpigmentation, vitiligo, or suspicious lesions  Neurological  Examination Mental Status: Alert, oriented, thought content appropriate.  Speech fluent without evidence of aphasia.  Able to follow 3 step commands without difficulty. Cranial Nerves: II: Discs flat bilaterally; Visual fields grossly normal, pupils equal, round, reactive to light and accommodation III,IV, VI: ptosis not present, extra-ocular motions intact bilaterally V,VII: smile symmetric, facial light touch sensation normal bilaterally VIII: hearing normal bilaterally IX,X: gag reflex present XI: bilateral shoulder shrug XII: midline tongue extension Motor: Right : Upper extremity   5/5    Left:     Upper extremity   5/5  Lower extremity   5/5     Lower extremity   5/5 Tone and bulk:normal tone throughout; no atrophy noted Sensory: Pinprick and light touch intact throughout, bilaterally Deep Tendon Reflexes: 2+ in the upper extremities and trace in the lower extremities Plantars: Right: mute   Left: mute Cerebellar: Normal finger-to-nose and normal heel-to-shin testing bilaterally Gait: not tested due to safety concerns    Laboratory Studies:   Basic Metabolic Panel:  Recent Labs Lab 01/05/16 1143 01/06/16 1144  NA 141 140  K 3.8 4.0  CL 104 105  CO2 31 30  GLUCOSE 100* 116*  BUN 7 14  CREATININE 1.14 1.11  CALCIUM 9.1 9.1    Liver Function Tests:  Recent Labs Lab 01/06/16 1144  AST 17  ALT 13*  ALKPHOS 50  BILITOT 0.6  PROT 7.2  ALBUMIN 4.4   No results for input(s): LIPASE, AMYLASE in the last 168 hours. No results for input(s): AMMONIA in the last 168 hours.  CBC:  Recent Labs Lab 01/05/16 1143 01/06/16 1144  WBC 4.0 3.8  NEUTROABS  --  2.4  HGB 12.2* 12.8*  HCT 35.0* 37.0*  MCV 84.2 84.0  PLT 209 210    Cardiac Enzymes:  Recent Labs Lab 01/05/16 1143 01/06/16 1144  TROPONINI <0.03 <0.03    BNP: Invalid input(s): POCBNP  CBG: No results for input(s): GLUCAP in the last 168 hours.  Microbiology: No results found for this or  any previous visit.  Coagulation Studies: No results for input(s): LABPROT, INR in the last 72 hours.  Urinalysis: No results for input(s): COLORURINE, LABSPEC, PHURINE, GLUCOSEU, HGBUR, BILIRUBINUR, KETONESUR, PROTEINUR, UROBILINOGEN, NITRITE, LEUKOCYTESUR in the last 168 hours.  Invalid input(s): APPERANCEUR  Lipid Panel:  No results found for: CHOL, TRIG, HDL, CHOLHDL, VLDL, LDLCALC  HgbA1C: No results found for: HGBA1C  Urine Drug Screen:     Component Value Date/Time   LABOPIA NONE DETECTED 01/05/2016 1143   LABOPIA NONE DETECTED 05/12/2015 1752   COCAINSCRNUR NONE DETECTED 01/05/2016 1143   COCAINSCRNUR NONE DETECTED 05/12/2015 1752   LABBENZ NONE DETECTED 01/05/2016 1143   LABBENZ NONE DETECTED 05/12/2015 1752   AMPHETMU NONE DETECTED 01/05/2016 1143   AMPHETMU NONE DETECTED 05/12/2015 1752   THCU NONE DETECTED 01/05/2016 1143   THCU POSITIVE* 05/12/2015 1752   LABBARB NONE DETECTED 01/05/2016 1143   LABBARB NONE DETECTED 05/12/2015 1752    Alcohol Level: No results for input(s): ETH in the last 168 hours.   Imaging: Ct Head Wo Contrast  01/05/2016  CLINICAL DATA:  Dazed and confused. Pt called EMS stating that " I felt like I was about to have another seizure" pt states he was released from prison on the 9th and had  approx 7 seizures while there. Unsure of injury. Mildly responsive. EXAM: CT HEAD WITHOUT CONTRAST TECHNIQUE: Contiguous axial images were obtained from the base of the skull through the vertex without intravenous contrast. COMPARISON:  Brain MRI 10/21/2015, head CT 10/16/2015 FINDINGS: No acute intracranial hemorrhage. No focal mass lesion. No CT evidence of acute infarction. No midline shift or mass effect. No hydrocephalus. Basilar cisterns are patent. Paranasal sinuses and mastoid air cells are clear. Orbits are clear. IMPRESSION: Normal head CT.  No change from prior. Electronically Signed   By: Genevive BiStewart  Edmunds M.D.   On: 01/05/2016 13:33      Assessment/Plan: 35 year old male with a history of seizures who presents with a breakthrough seizure.  Depakote level therapeutic at 71.  Patient not taking medications appropriately.    Recommendations: 1. Medication instructions to be stressed.   2. Keppra 500mg  IV now 3. Patient to obtain a outpatient physician for follow ion an outpatient basis.     Thana FarrLeslie Lizandro Spellman, MD Neurology [phone] 01/06/2016, 1:19 PM

## 2016-01-06 NOTE — Discharge Instructions (Signed)
Please make sure that your using your prescriptions appropriately and not missing doses.  You are supposed to take 1500 mg of Keppra twice a day.  You should also be taking 250 mg of Depakote DR twice a day.  Please make sure to set up close follow-up with neurology.  You have been seen in the emergency department today for a seizure.  Your workup today including labs are within normal limits.  Please follow up with your doctor/neurologist as soon as possible regarding today's emergency department visit and your likely seizure.  As we have discussed it is very important that you do not drive until you have been seen and cleared by your neurologist.  Please drink plenty of fluids, get plenty of sleep and avoid any alcohol or drug use Please return to the emergency department if you have any further seizures which do not respond to medications, or for any other symptoms per se concerning for yourself.   Epilepsy Epilepsy is a disorder in which a person has repeated seizures over time. A seizure is a release of abnormal electrical activity in the brain. Seizures can cause a change in attention, behavior, or the ability to remain awake and alert (altered mental status). Seizures often involve uncontrollable shaking (convulsions).  Most people with epilepsy lead normal lives. However, people with epilepsy are at an increased risk of falls, accidents, and injuries. Therefore, it is important to begin treatment right away. CAUSES  Epilepsy has many possible causes. Anything that disturbs the normal pattern of brain cell activity can lead to seizures. This may include:   Head injury.  Birth trauma.  High fever as a child.  Stroke.  Bleeding into or around the brain.  Certain drugs.  Prolonged low oxygen, such as what occurs after CPR efforts.  Abnormal brain development.  Certain illnesses, such as meningitis, encephalitis (brain infection), malaria, and other infections.  An  imbalance of nerve signaling chemicals (neurotransmitters).  SIGNS AND SYMPTOMS  The symptoms of a seizure can vary greatly from one person to another. Right before a seizure, you may have a warning (aura) that a seizure is about to occur. An aura may include the following symptoms:  Fear or anxiety.  Nausea.  Feeling like the room is spinning (vertigo).  Vision changes, such as seeing flashing lights or spots. Common symptoms during a seizure include:  Abnormal sensations, such as an abnormal smell or a bitter taste in the mouth.   Sudden, general body stiffness.   Convulsions that involve rhythmic jerking of the face, arm, or leg on one or both sides.   Sudden change in consciousness.   Appearing to be awake but not responding.   Appearing to be asleep but cannot be awakened.   Grimacing, chewing, lip smacking, drooling, tongue biting, or loss of bowel or bladder control. After a seizure, you may feel sleepy for a while. DIAGNOSIS  Your health care provider will ask about your symptoms and take a medical history. Descriptions from any witnesses to your seizures will be very helpful in the diagnosis. A physical exam, including a detailed neurological exam, is necessary. Various tests may be done, such as:   An electroencephalogram (EEG). This is a painless test of your brain waves. In this test, a diagram is created of your brain waves. These diagrams can be interpreted by a specialist.  An MRI of the brain.   A CT scan of the brain.   A spinal tap (lumbar puncture, LP).  Blood tests  to check for signs of infection or abnormal blood chemistry. TREATMENT  There is no cure for epilepsy, but it is generally treatable. Once epilepsy is diagnosed, it is important to begin treatment as soon as possible. For most people with epilepsy, seizures can be controlled with medicines. The following may also be used:  A pacemaker for the brain (vagus nerve stimulator) can be used  for people with seizures that are not well controlled by medicine.  Surgery on the brain. For some people, epilepsy eventually goes away. HOME CARE INSTRUCTIONS   Follow your health care provider's recommendations on driving and safety in normal activities.  Get enough rest. Lack of sleep can cause seizures.  Only take over-the-counter or prescription medicines as directed by your health care provider. Take any prescribed medicine exactly as directed.  Avoid any known triggers of your seizures.  Keep a seizure diary. Record what you recall about any seizure, especially any possible trigger.   Make sure the people you live and work with know that you are prone to seizures. They should receive instructions on how to help you. In general, a witness to a seizure should:   Cushion your head and body.   Turn you on your side.   Avoid unnecessarily restraining you.   Not place anything inside your mouth.   Call for emergency medical help if there is any question about what has occurred.   Follow up with your health care provider as directed. You may need regular blood tests to monitor the levels of your medicine.  SEEK MEDICAL CARE IF:   You develop signs of infection or other illness. This might increase the risk of a seizure.   You seem to be having more frequent seizures.   Your seizure pattern is changing.  SEEK IMMEDIATE MEDICAL CARE IF:   You have a seizure that does not stop after a few moments.   You have a seizure that causes any difficulty in breathing.   You have a seizure that results in a very severe headache.   You have a seizure that leaves you with the inability to speak or use a part of your body.    This information is not intended to replace advice given to you by your health care provider. Make sure you discuss any questions you have with your health care provider.   Document Released: 09/03/2005 Document Revised: 06/24/2013 Document  Reviewed: 04/15/2013 Elsevier Interactive Patient Education [employer]2016 [employer] Inc[employer]

## 2016-01-06 NOTE — ED Notes (Signed)
Pt came to ED via EMS c/o seizure. Pt called 911 and reported he felt like he was about to have a seizure. The police department estimated that the seizure lasted 1-2 mins. Pt was postictal upon EMS arrival. Pt has history of epilepsy.

## 2016-01-06 NOTE — ED Notes (Signed)
Neurologist at bedside. 

## 2016-01-06 NOTE — ED Provider Notes (Signed)
[facility] Emergency Department Provider Note  ____________________________________________  Time seen: Approximately 11:49 AM  I have reviewed the triage vital signs and the nursing notes.   HISTORY  Chief Complaint Seizures  EM caveat: Patient unable to recall some elements of the history  HPI Keith Chung is a 35 y.o. male presents reporting he had another seizure. Evidently witnessed to have a brief generalized seizure with police/fire.  He reports that he was walking, started to feel dizzy which she reports as a feeling he gets for seizures, then awoke on the ground with paramedics.  Patient reportedly did have a seizure.  He reports that he is continuing to take his normal medications including valproic acid and Keppra.  He denies injury. At this time he reports he just feels "tired". Denies any other recent illness. He was recently jailed and reports he had 7 seizures while in the jail.  At this time he just reports feeling tired. No trouble speaking, no weakness in one arm or leg, no nausea or vomiting. No stiff neck, neck pain fever or chills.  Past Medical History  Diagnosis Date  . Seizures [facility])     Patient Active Problem List   Diagnosis Date Noted  . Valproic acid toxicity   . Acute delirium 10/17/2015  . Postictal state (HCC) 10/17/2015  . Epilepsy (HCC) 10/17/2015  . Overdose 10/16/2015    Past Surgical History  Procedure Laterality Date  . Vasectomy Bilateral   . Hand surgery      right hand surgery    Current Outpatient Rx  Name  Route  Sig  Dispense  Refill  . divalproex (DEPAKOTE) 250 MG DR tablet   Oral   Take 1 tablet (250 mg total) by mouth 2 (two) times daily.   60 tablet   0   . levETIRAcetam (KEPPRA) 500 MG tablet   Oral   Take 3 tablets (1,500 mg total) by mouth 2 (two) times daily.   180 tablet   2   . pantoprazole (PROTONIX) 20 MG tablet   Oral   Take 1 tablet (20 mg total) by mouth daily.  30 tablet   1     Allergies Pollen extract  Family History  Problem Relation Age of Onset  . Hypertension Mother   . Diabetes Mellitus II Maternal Grandmother     Social History Social History  Substance Use Topics  . Smoking status: Light Tobacco Smoker -- 0.30 packs/day    Types: Cigarettes    Last Attempt to Quit: 07/18/2014  . Smokeless tobacco: None     Comment: stopped 4 months ago  . Alcohol Use: No     Comment: socially    Review of Systems Constitutional: No fever/chills Eyes: No visual changes. ENT: No sore throat. Cardiovascular: Denies chest pain. Respiratory: Denies shortness of breath. Gastrointestinal: No abdominal pain.  No nausea, no vomiting.  No diarrhea.  No constipation. Genitourinary: Negative for dysuria. Musculoskeletal: Negative for back pain. Skin: Negative for rash. Neurological: Negative for focal weakness or numbness.Slight headache which she reports he gets after seizures.  10-point ROS otherwise negative.  ____________________________________________   PHYSICAL EXAM:  VITAL SIGNS: ED Triage Vitals  Enc Vitals Group     BP 01/06/16 1141 141/77 mmHg     Pulse Rate 01/06/16 1141 78     Resp 01/06/16 1141 16     Temp 01/06/16 1141 97.9 F (36.6 C)     Temp Source 01/06/16 1141 Oral  SpO2 01/06/16 1141 99 %     Weight 01/06/16 1141 170 lb 14.4 oz (77.52 kg)     Height 01/06/16 1141 6\' 2"  (1.88 m)     Head Cir --      Peak Flow --      Pain Score --      Pain Loc --      Pain Edu? --      Excl. in GC? --    Constitutional: Alert and oriented. Well appearing and in no acute distress.He appears slightly fatigued. Eyes: Conjunctivae are normal. PERRL. EOMI. Head: Atraumatic. Nose: No congestion/rhinnorhea. Mouth/Throat: Mucous membranes are moist.  Oropharynx non-erythematous. Neck: No stridor.  No cervical spine tenderness. No meningismus. Cardiovascular: Normal rate, regular rhythm. Grossly normal heart sounds.  Good  peripheral circulation. Respiratory: Normal respiratory effort.  No retractions. Lungs CTAB. Gastrointestinal: Soft and nontender. No distention.  Musculoskeletal: No lower extremity tenderness nor edema.  Neurologic:  Normal speech and language. No gross focal neurologic deficits are appreciated. Cranial nerve exam normal. Moves all extremities with normal strength.  Skin:  Skin is warm, dry and intact. No rash noted. Psychiatric: Mood and affect are slightly flat. Speech and behavior are normal.  ____________________________________________   LABS (all labs ordered are listed, but only abnormal results are displayed)  Labs Reviewed  CBC WITH DIFFERENTIAL/PLATELET - Abnormal; Notable for the following:    Hemoglobin 12.8 (*)    HCT 37.0 (*)    All other components within normal limits  COMPREHENSIVE METABOLIC PANEL - Abnormal; Notable for the following:    Glucose, Bld 116 (*)    ALT 13 (*)    All other components within normal limits  TROPONIN I  VALPROIC ACID LEVEL  URINALYSIS COMPLETEWITH MICROSCOPIC ([facility] ONLY)  PROLACTIN   ____________________________________________  EKG  Reviewed and interpreted by me at 1330 Heart rate 70 Normal sinus rhythm QTC 420 QRS 100 PR 150 There is mild J-point elevation consistent with early repolarization seen in V2 and V3 felt to be unchanged from his previous EKG.  Reviewed and interpreted as normal sinus rhythm with early repolarization. No evidence of long QT, Brugada or WPW or other congenital abnormality. ____________________________________________  RADIOLOGY   ____________________________________________   PROCEDURES  Procedure(s) performed: None  Critical Care performed: No  ____________________________________________   INITIAL IMPRESSION / ASSESSMENT AND PLAN / ED COURSE  Pertinent labs & imaging results that were available during my care of the patient were reviewed by me and considered in my medical decision  making (see chart for details).  Patient presents for evaluation after a witnessed seizure. He does carry a history of known seizures. He was discharged yesterday with no medication change, and today evidently did have another seizure with a very similar presentation. The days was witnessed.  Discussed with Dr. Thad Rangereynolds of neurology, but have requested a consult given he has been seen twice for the same and continues to have breakthrough seizures on his current medications for which is valproic acid level was therapeutic.  Patient seen by Dr. Thad Rangereynolds, she advises no change to his medication regimen and that he is likely not compliant with using his medicine as prescribed. Discussed with the patient, he is agreeable to working harder and taking his medicine as prescribed. He is to follow-up with a neurologist within the next 2 weeks, he reports he has an appointment coming up at a different clinic already. I advised he can follow-up here with neurology as well. He is agreeable to return  precautions and close follow-up care. He is not driving. He is awake alert and fully oriented and in no distress at this time. ____________________________________________   FINAL CLINICAL IMPRESSION(S) / ED DIAGNOSES  Final diagnoses:  Seizure [facility])  Medical non-compliance      Sharyn Creamer, MD 01/06/16 [phone]

## 2016-01-07 LAB — PROLACTIN: Prolactin: 11.2 ng/mL (ref 4.0–15.2)

## 2016-01-10 ENCOUNTER — Ambulatory Visit (INDEPENDENT_AMBULATORY_CARE_PROVIDER_SITE_OTHER): Payer: Self-pay | Admitting: Neurology

## 2016-01-10 ENCOUNTER — Encounter: Payer: Self-pay | Admitting: Neurology

## 2016-01-10 VITALS — BP 128/68 | HR 72 | Ht 74.0 in | Wt 170.0 lb

## 2016-01-10 DIAGNOSIS — G40909 Epilepsy, unspecified, not intractable, without status epilepticus: Secondary | ICD-10-CM

## 2016-01-10 MED ORDER — DIVALPROEX SODIUM 500 MG PO DR TAB: 500.0000 mg | DELAYED_RELEASE_TABLET | Freq: Two times a day (BID) | ORAL | Status: DC

## 2016-01-10 MED ORDER — DIVALPROEX SODIUM 500 MG PO DR TAB
500.0000 mg | DELAYED_RELEASE_TABLET | Freq: Two times a day (BID) | ORAL | Status: DC
Start: 2016-01-10 — End: 2016-01-10

## 2016-01-10 MED ORDER — LEVETIRACETAM 500 MG PO TABS: 1500.0000 mg | ORAL_TABLET | Freq: Two times a day (BID) | ORAL | Status: DC

## 2016-01-10 NOTE — Progress Notes (Signed)
PATIENT: Keith Chung DOB: Jun 14, 1981  Chief Complaint  Patient presents with  . Seizures    Says he was diagnosed with seizures as a child.  He is currently taking Depakote , one tablet BID and Keppra , 3 tablets BID.  His last seizure occurred on 01/06/16.  Denies missing any doses of medication.  Reports being in jail from 10/2015 until 12/25/2015.  Says while incarcerated, he had seven seizures despite being given his medications as prescribed.     HISTORICAL  Keith Chung is a 35 years old right-handed male, alone at today's clinical visit, seen in refer by emergency room for epilepsy   I have reviewed and summarized multiple ED visit, reviewed multiple laboratory evaluations, UDS was once positive for marijuana, normal CMP, CBC, In January 2017, Depakote level was 188, with Keppra level of 68, but I am not sure what the timing of the antiepileptic medication and the blood sample.   He is a poor historian, per patient, he had epilepsy since childhood, also ADHD, has been taking different antiepileptic medications for long time, also Ritalin, she was taking care of by Dr. Laural Benes at [facility], he used to work at [employer] job, operating heavy equipment, last times he went to work was in September 2016,  He had occasionally seizure in the past, but since summer of 2016, he began to have frequent seizure again, it was difficult to get a clear story whether he has been complying with his medications are not, what kind of antiepileptic medications he was taking, He is now taking to Divalproex sodium DR to 50 mg twice a day, levetiracetam 500 mg 3 tablets twice a day, most recent seizure was January 06 2016, he woke up, his brother told him he had seizure.  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  MRA of brain was normal   REVIEW OF SYSTEMS: Full 14 system review of systems performed and notable only for as above  ALLERGIES: Allergies  Allergen Reactions  . Pollen Extract Other (See Comments)    Runny nose, watery eyes, sneezing    HOME MEDICATIONS: Current Outpatient Prescriptions  Medication Sig Dispense Refill  . divalproex (DEPAKOTE) 250 MG DR tablet Take 1 tablet (250 mg total) by mouth 2 (two) times daily. 60 tablet 0  . levETIRAcetam (KEPPRA) 500 MG tablet Take 3 tablets (1,500 mg total) by mouth 2 (two) times daily. 180 tablet 2  . [DISCONTINUED] phenytoin (DILANTIN) 100 MG ER capsule Take 1 capsule (100 mg total) by mouth 3 (three) times daily. (Patient not taking: Reported on 05/11/2015) 90 capsule 0   No current facility-administered medications for this visit.    PAST MEDICAL HISTORY: Past Medical History  Diagnosis Date  . Seizures (HCC)   . Learning disorder   . Seasonal allergies     PAST SURGICAL HISTORY: Past Surgical History  Procedure Laterality Date  . Vasectomy Bilateral   . Hand surgery      right hand surgery    FAMILY HISTORY: Family History  Problem Relation Age  of Onset  . Hypertension Mother   . Diabetes Mellitus II Maternal Grandmother   . Cancer Father     Unsure of type  . Hypertension Father     SOCIAL HISTORY:  Social History   Social History  . Marital Status: Single    Spouse Name: N/A  . Number of Children: 4  . Years of Education: GED   Occupational History  . Unemployed    Social History Main Topics  . Smoking status: Former Smoker -- 0.30 packs/day    Types: Cigarettes    Quit date: 07/18/2014  . Smokeless tobacco: Not on file       Comment: stopped 4 months ago  . Alcohol Use: No     Comment: socially  . Drug Use: No     Comment: No longer using - last smoked 04/2015.  Marland Kitchen. Sexual Activity: Yes    Birth Control/ Protection: Surgical   Other Topics Concern  . Not on file   Social History Narrative   Not driving or working due to seizures.   Lives at home with his brother.   Right-handed.   Occasional use of caffeine.     PHYSICAL EXAM   Filed Vitals:   01/10/16 1328  BP: 128/68  Pulse: 72  Height: 6\' 2"  (1.88 m)  Weight: 170 lb (77.111 kg)    Not recorded      Body mass index is 21.82 kg/(m^2).  PHYSICAL EXAMNIATION:  Gen: NAD, conversant, well nourised, obese, well groomed                     Cardiovascular: Regular rate rhythm, no peripheral edema, warm, nontender. Eyes: Conjunctivae clear without exudates or hemorrhage Neck: Supple, no carotid bruise. Pulmonary: Clear to auscultation bilaterally   NEUROLOGICAL EXAM:  MENTAL STATUS: Speech:    Speech is normal; fluent and spontaneous with normal comprehension.  Cognition:     Orientation to time, place and person     Normal recent and remote memory     Normal Attention span and concentration     Normal Language, naming, repeating,spontaneous speech     Fund of knowledge   CRANIAL NERVES: CN II: Visual fields are full to confrontation. Fundoscopic exam is normal with sharp discs and no vascular changes. Pupils are round equal and briskly reactive to light. CN III, IV, VI: extraocular movement are normal. No ptosis. CN V: Facial sensation is intact to pinprick in all 3 divisions bilaterally. Corneal responses are intact.  CN VII: Face is symmetric with normal eye closure and smile. CN VIII: Hearing is normal to rubbing fingers CN IX, X: Palate elevates symmetrically. Phonation is normal. CN XI: Head turning and shoulder shrug are intact CN XII: Tongue is midline with normal movements and no atrophy.  MOTOR: There is no pronator  drift of out-stretched arms. Muscle bulk and tone are normal. Muscle strength is normal.  REFLEXES: Reflexes are 2+ and symmetric at the biceps, triceps, knees, and ankles. Plantar responses are flexor.  SENSORY: Intact to light touch, pinprick, positional sensation and vibratory sensation are intact in fingers and toes.  COORDINATION: Rapid alternating movements and fine finger movements are intact. There is no dysmetria on finger-to-nose and heel-knee-shin.    GAIT/STANCE: Posture is normal. Gait is steady with normal steps, base, arm swing, and turning. Heel and toe walking are normal. Tandem gait is normal.  Romberg is absent.   DIAGNOSTIC DATA (LABS, IMAGING, TESTING) - I reviewed patient records, labs,  notes, testing and imaging myself where available.   ASSESSMENT AND PLAN  NASRI BOAKYE is a 35 y.o. male   Epilepsy  EEG  He had recurrent seizure while taking Keppra 500 mg 3 tablets twice a day, Depakote dr 250 mg twice a day, I have advised him increased to Depakote DR 500 mg twice a day    Levert Feinstein, M.D. Ph.D.  [facility] [address] [place], [place] [address] Ph: [phone] Fax: [phone]  CC: Referring Provider

## 2016-01-20 ENCOUNTER — Emergency Department
Admission: EM | Admit: 2016-01-20 | Discharge: 2016-01-20 | Disposition: A | Discharge status: Home / Self Care | Payer: Medicaid Other | Attending: Student | Admitting: Student

## 2016-01-20 ENCOUNTER — Encounter: Payer: Self-pay | Admitting: Emergency Medicine

## 2016-01-20 DIAGNOSIS — Z87891 Personal history of nicotine dependence: Secondary | ICD-10-CM | POA: Insufficient documentation

## 2016-01-20 DIAGNOSIS — G40909 Epilepsy, unspecified, not intractable, without status epilepticus: Secondary | ICD-10-CM | POA: Insufficient documentation

## 2016-01-20 DIAGNOSIS — R569 Unspecified convulsions: Secondary | ICD-10-CM

## 2016-01-20 LAB — CBC WITH DIFFERENTIAL/PLATELET
Basophils Absolute: 0 10*3/uL (ref 0–0.1)
Basophils Relative: 1 %
EOS ABS: 0.1 10*3/uL (ref 0–0.7)
EOS PCT: 3 %
HCT: 38 % — ABNORMAL LOW (ref 40.0–52.0)
HEMOGLOBIN: 12.9 g/dL — AB (ref 13.0–18.0)
LYMPHS ABS: 1 10*3/uL (ref 1.0–3.6)
Lymphocytes Relative: 27 %
MCH: 29 pg (ref 26.0–34.0)
MCHC: 34 g/dL (ref 32.0–36.0)
MCV: 85.4 fL (ref 80.0–100.0)
MONOS PCT: 8 %
Monocytes Absolute: 0.3 10*3/uL (ref 0.2–1.0)
NEUTROS PCT: 61 %
Neutro Abs: 2.2 10*3/uL (ref 1.4–6.5)
PLATELETS: 220 10*3/uL (ref 150–440)
RBC: 4.45 MIL/uL (ref 4.40–5.90)
RDW: 12.9 % (ref 11.5–14.5)
WBC: 3.7 10*3/uL — ABNORMAL LOW (ref 3.8–10.6)

## 2016-01-20 LAB — VALPROIC ACID LEVEL: Valproic Acid Lvl: 93 ug/mL (ref 50.0–100.0)

## 2016-01-20 LAB — ETHANOL

## 2016-01-20 LAB — COMPREHENSIVE METABOLIC PANEL
ALBUMIN: 4.5 g/dL (ref 3.5–5.0)
ALT: 12 U/L — ABNORMAL LOW (ref 17–63)
AST: 15 U/L (ref 15–41)
Alkaline Phosphatase: 42 U/L (ref 38–126)
Anion gap: 7 (ref 5–15)
BUN: 9 mg/dL (ref 6–20)
CHLORIDE: 105 mmol/L (ref 101–111)
CO2: 29 mmol/L (ref 22–32)
Calcium: 9 mg/dL (ref 8.9–10.3)
Creatinine, Ser: 0.95 mg/dL (ref 0.61–1.24)
GFR calc Af Amer: >60 mL/min (ref >60)
Glucose, Bld: 95 mg/dL (ref 65–99)
POTASSIUM: 4.1 mmol/L (ref 3.5–5.1)
SODIUM: 141 mmol/L (ref 135–145)
Total Bilirubin: 0.6 mg/dL (ref 0.3–1.2)
Total Protein: 7.3 g/dL (ref 6.5–8.1)

## 2016-01-20 MED ORDER — LEVETIRACETAM 500 MG PO TABS
1500.0000 mg | ORAL_TABLET | Freq: Once | ORAL | Status: AC
  Administered 2016-01-20: 1500 mg via ORAL
  Filled 2016-01-20: qty 3

## 2016-01-20 MED ORDER — DIVALPROEX SODIUM 250 MG PO DR TAB
250.0000 mg | DELAYED_RELEASE_TABLET | Freq: Once | ORAL | Status: AC
  Administered 2016-01-20: 250 mg via ORAL
  Filled 2016-01-20: qty 1

## 2016-01-20 NOTE — ED Notes (Signed)
Assisted to restroom.

## 2016-01-20 NOTE — ED Notes (Signed)
[place] [facility]. Pt was witnessed in bathroom having a possible seizure by an attorney. Upon EMS arrival, pt was found seated outside bathroom, alert and oriented. EMS reports VSS.

## 2016-01-20 NOTE — ED Provider Notes (Signed)
[facility] Emergency Department Provider Note   ____________________________________________  Time seen: Approximately 12:10 PM  I have reviewed the triage vital signs and the nursing notes.   HISTORY  Chief Complaint Seizures    HPI Keith Chung is a 35 y.o. male with history of known seizure disorder on Depakote and Keppra who presents for evaluation of a generalized tonic-clonic seizure today, gradual onset, now resolved, no modifying factors. Per patient and EMS report, [place] he was witnessed to be having a grand mal seizure in the bathroom. On EMS arrival, he is awake, alert and oriented. He has a known seizure disorder and reports that he is compliant with medications however when he was seen in the ER on 01/06/2016 for seizures, neurology evaluated him and there was some concern for noncompliance. He has not had a seizure since that time. He may have "bumped" his head today but he denies any headache at this time. He denies any recent illness including no chest pain, difficulty breathing, nausea, vomiting, diarrhea, fevers or chills. No abdominal pain. He denies any drug or alcohol use.   Past Medical History  Diagnosis Date  . Seizures (HCC)   . Learning disorder   . Seasonal allergies     Patient Active Problem List   Diagnosis Date Noted  . Valproic acid toxicity   . Acute delirium 10/17/2015  . Postictal state (HCC) 10/17/2015  . Epilepsy (HCC) 10/17/2015  . Overdose 10/16/2015    Past Surgical History  Procedure Laterality Date  . Vasectomy Bilateral   . Hand surgery      right hand surgery    Current Outpatient Rx  Name  Route  Sig  Dispense  Refill  . divalproex (DEPAKOTE) 500 MG DR tablet   Oral   Take 1 tablet (500 mg total) by mouth 2 (two) times daily.   120 tablet   6   . levETIRAcetam (KEPPRA) 500 MG tablet   Oral   Take 3 tablets (1,500 mg total) by mouth 2 (two) times daily.  180 tablet   6     Allergies Pollen extract  Family History  Problem Relation Age of Onset  . Hypertension Mother   . Diabetes Mellitus II Maternal Grandmother   . Cancer Father     Unsure of type  . Hypertension Father     Social History Social History  Substance Use Topics  . Smoking status: Former Smoker -- 0.30 packs/day    Types: Cigarettes    Quit date: 07/18/2014  . Smokeless tobacco: None     Comment: stopped 4 months ago  . Alcohol Use: No     Comment: socially    Review of Systems Constitutional: No fever/chills Eyes: No visual changes. ENT: No sore throat. Cardiovascular: Denies chest pain. Respiratory: Denies shortness of breath. Gastrointestinal: No abdominal pain.  No nausea, no vomiting.  No diarrhea.  No constipation. Genitourinary: Negative for dysuria. Musculoskeletal: Negative for back pain. Skin: Negative for rash. Neurological: Negative for headaches, focal weakness or numbness.  10-point ROS otherwise negative.  ____________________________________________   PHYSICAL EXAM:  VITAL SIGNS: ED Triage Vitals  Enc Vitals Group     BP 01/20/16 1117 126/104 mmHg     Pulse Rate 01/20/16 1117 56     Resp 01/20/16 1117 18     Temp 01/20/16 1117 97.5 F (36.4 C)     Temp Source 01/20/16 1117 Oral     SpO2 01/20/16  1117 100 %     Weight 01/20/16 1117 170 lb (77.111 kg)     Height 01/20/16 1117 6\' 1"  (1.854 m)     Head Cir --      Peak Flow --      Pain Score 01/20/16 1118 10     Pain Loc --      Pain Edu? --      Excl. in GC? --     Constitutional: Alert and oriented. Well appearing and in no acute distress. Eyes: Conjunctivae are normal. PERRL. EOMI. Head: Atraumatic. Nose: No congestion/rhinnorhea. Mouth/Throat: Mucous membranes are moist.  Oropharynx non-erythematous. Neck: No stridor.  No cervical spine tenderness to palpation. Cardiovascular: Normal rate, regular rhythm. Grossly normal heart sounds.  Good peripheral  circulation. Respiratory: Normal respiratory effort.  No retractions. Lungs CTAB. Gastrointestinal: Soft and nontender. No distention.  No CVA tenderness. Genitourinary: deferred Musculoskeletal: No lower extremity tenderness nor edema.  No joint effusions. No midline T or L-spine tenderness to palpation. Neurologic:  Normal speech and language. No gross focal neurologic deficits are appreciated. 5 out of 5 strength in bilateral upper and lower extremities, sensation intact to light touch throughout, cranial nerves II through XII intact. Skin:  Skin is warm, dry and intact. No rash noted. Psychiatric: Mood and affect are normal. Speech and behavior are normal.  ____________________________________________   LABS (all labs ordered are listed, but only abnormal results are displayed)  Labs Reviewed  CBC WITH DIFFERENTIAL/PLATELET - Abnormal; Notable for the following:    WBC 3.7 (*)    Hemoglobin 12.9 (*)    HCT 38.0 (*)    All other components within normal limits  COMPREHENSIVE METABOLIC PANEL - Abnormal; Notable for the following:    ALT 12 (*)    All other components within normal limits  ETHANOL  VALPROIC ACID LEVEL   ____________________________________________  EKG  ED ECG REPORT I, Gayla Doss, the attending physician, personally viewed and interpreted this ECG.   Date: 01/20/2016  EKG Time: 11:34  Rate: 64  Rhythm: normal sinus rhythm  Axis: normal  Intervals:none  ST&T Change: No acute STEMI. J-point elevation in V2, V3, V4, V5.  ____________________________________________  RADIOLOGY  none ____________________________________________   PROCEDURES  Procedure(s) performed: None  Critical Care performed: No  ____________________________________________   INITIAL IMPRESSION / ASSESSMENT AND PLAN / ED COURSE  Pertinent labs & imaging results that were available during my care of the patient were reviewed by me and considered in my medical decision  making (see chart for details).  Keith Chung is a 35 y.o. male with history of known seizure disorder on Depakote and Keppra who presents for evaluation of a generalized tonic-clonic seizure today. On exam, he is very well-appearing and in no acute distress. His vital signs are stable, he is afebrile. He has an atraumatic exam and intact/nonfocal neurological examination. We'll give his home doses of Keppra and Depakote, obtain screening labs, Depakote level and observe in the emergency department. No indication for head imaging at this time.  ----------------------------------------- 2:50 PM on 01/20/2016 ----------------------------------------- The patient is awake, alert, oriented, still with nonfocal neurological examination. Labs reviewed. CBC with mild leukopenia, white blood cell count is 3.7. Mild anemia is also noted, hemoglobin 12.9. Unremarkable CMP, negative ethanol level, therapeutic Depakote level. He is observed for greater than 3 hours in the emergency department without any recurrence of seizure activity. We discussed that he would continue his current seizure medications and follow up with his neurologist  as soon as possible. We discussed return precautions, need for close follow-up and he is comfortable with the discharge plan. DC home.  ____________________________________________   FINAL CLINICAL IMPRESSION(S) / ED DIAGNOSES  Final diagnoses:  Seizure (HCC)      NEW MEDICATIONS STARTED DURING THIS VISIT:  New Prescriptions   No medications on file     Note:  This document was prepared using Dragon voice recognition software and may include unintentional dictation errors.    Gayla Doss, MD 01/20/16 [phone]

## 2016-01-20 NOTE — ED Notes (Addendum)
Patient states that he had 2 seizures. Patient has known seizure disorder and states that he has been following up with his neurologist and taking his medication as prescribed. Patient does not appear to be in any distress at this time, breathing is equal and unlabored. Patient A & O x 4, however, patient is slow in his responses.

## 2016-01-23 ENCOUNTER — Emergency Department
Admission: EM | Admit: 2016-01-23 | Discharge: 2016-01-23 | Disposition: A | Discharge status: Home / Self Care | Payer: Medicaid Other | Attending: Emergency Medicine | Admitting: Emergency Medicine

## 2016-01-23 ENCOUNTER — Encounter: Payer: Self-pay | Admitting: Emergency Medicine

## 2016-01-23 DIAGNOSIS — Z87891 Personal history of nicotine dependence: Secondary | ICD-10-CM | POA: Diagnosis not present

## 2016-01-23 DIAGNOSIS — G40909 Epilepsy, unspecified, not intractable, without status epilepticus: Secondary | ICD-10-CM | POA: Insufficient documentation

## 2016-01-23 DIAGNOSIS — R569 Unspecified convulsions: Secondary | ICD-10-CM | POA: Diagnosis present

## 2016-01-23 LAB — BASIC METABOLIC PANEL WITH GFR
Anion gap: 7 (ref 5–15)
BUN: 8 mg/dL (ref 6–20)
CO2: 30 mmol/L (ref 22–32)
Calcium: 9 mg/dL (ref 8.9–10.3)
Chloride: 102 mmol/L (ref 101–111)
Creatinine, Ser: 0.99 mg/dL (ref 0.61–1.24)
GFR calc Af Amer: >60 mL/min
GFR calc non Af Amer: >60 mL/min
Glucose, Bld: 94 mg/dL (ref 65–99)
Potassium: 4.2 mmol/L (ref 3.5–5.1)
Sodium: 139 mmol/L (ref 135–145)

## 2016-01-23 LAB — VALPROIC ACID LEVEL: Valproic Acid Lvl: 139 ug/mL — ABNORMAL HIGH (ref 50.0–100.0)

## 2016-01-23 NOTE — Discharge Instructions (Signed)
You have been seen in the emergency department today for a seizure.  Your workup today including labs are within normal limits EXCEPT, your DEPAKOTE level needs to be RECHECKED within the next two to three days. Call Dr. Zannie Cove office today to setup close follow-up by mid-week.  Please follow up with your doctor/neurologist as soon as possible regarding today's emergency department visit and your likely seizure.  As we have discussed it is very important that you do not drive until you have been seen and cleared by your neurologist.  Please drink plenty of fluids, get plenty of sleep and avoid any alcohol or drug use Please return to the emergency department if you have any further seizures which do not respond to medications, or for any other symptoms per se concerning for yourself.   Epilepsy Epilepsy is a disorder in which a person has repeated seizures over time. A seizure is a release of abnormal electrical activity in the brain. Seizures can cause a change in attention, behavior, or the ability to remain awake and alert (altered mental status). Seizures often involve uncontrollable shaking (convulsions).  Most people with epilepsy lead normal lives. However, people with epilepsy are at an increased risk of falls, accidents, and injuries. Therefore, it is important to begin treatment right away. CAUSES  Epilepsy has many possible causes. Anything that disturbs the normal pattern of brain cell activity can lead to seizures. This may include:   Head injury.  Birth trauma.  High fever as a child.  Stroke.  Bleeding into or around the brain.  Certain drugs.  Prolonged low oxygen, such as what occurs after CPR efforts.  Abnormal brain development.  Certain illnesses, such as meningitis, encephalitis (brain infection), malaria, and other infections.  An imbalance of nerve signaling chemicals (neurotransmitters).  SIGNS AND SYMPTOMS  The symptoms of a seizure can vary greatly from one  person to another. Right before a seizure, you may have a warning (aura) that a seizure is about to occur. An aura may include the following symptoms:  Fear or anxiety.  Nausea.  Feeling like the room is spinning (vertigo).  Vision changes, such as seeing flashing lights or spots. Common symptoms during a seizure include:  Abnormal sensations, such as an abnormal smell or a bitter taste in the mouth.   Sudden, general body stiffness.   Convulsions that involve rhythmic jerking of the face, arm, or leg on one or both sides.   Sudden change in consciousness.   Appearing to be awake but not responding.   Appearing to be asleep but cannot be awakened.   Grimacing, chewing, lip smacking, drooling, tongue biting, or loss of bowel or bladder control. After a seizure, you may feel sleepy for a while. DIAGNOSIS  Your health care provider will ask about your symptoms and take a medical history. Descriptions from any witnesses to your seizures will be very helpful in the diagnosis. A physical exam, including a detailed neurological exam, is necessary. Various tests may be done, such as:   An electroencephalogram (EEG). This is a painless test of your brain waves. In this test, a diagram is created of your brain waves. These diagrams can be interpreted by a specialist.  An MRI of the brain.   A CT scan of the brain.   A spinal tap (lumbar puncture, LP).  Blood tests to check for signs of infection or abnormal blood chemistry. TREATMENT  There is no cure for epilepsy, but it is generally treatable. Once epilepsy is  diagnosed, it is important to begin treatment as soon as possible. For most people with epilepsy, seizures can be controlled with medicines. The following may also be used:  A pacemaker for the brain (vagus nerve stimulator) can be used for people with seizures that are not well controlled by medicine.  Surgery on the brain. For some people, epilepsy eventually goes  away. HOME CARE INSTRUCTIONS   Follow your health care provider's recommendations on driving and safety in normal activities.  Get enough rest. Lack of sleep can cause seizures.  Only take over-the-counter or prescription medicines as directed by your health care provider. Take any prescribed medicine exactly as directed.  Avoid any known triggers of your seizures.  Keep a seizure diary. Record what you recall about any seizure, especially any possible trigger.   Make sure the people you live and work with know that you are prone to seizures. They should receive instructions on how to help you. In general, a witness to a seizure should:   Cushion your head and body.   Turn you on your side.   Avoid unnecessarily restraining you.   Not place anything inside your mouth.   Call for emergency medical help if there is any question about what has occurred.   Follow up with your health care provider as directed. You may need regular blood tests to monitor the levels of your medicine.  SEEK MEDICAL CARE IF:   You develop signs of infection or other illness. This might increase the risk of a seizure.   You seem to be having more frequent seizures.   Your seizure pattern is changing.  SEEK IMMEDIATE MEDICAL CARE IF:   You have a seizure that does not stop after a few moments.   You have a seizure that causes any difficulty in breathing.   You have a seizure that results in a very severe headache.   You have a seizure that leaves you with the inability to speak or use a part of your body.    This information is not intended to replace advice given to you by your health care provider. Make sure you discuss any questions you have with your health care provider.   Document Released: 09/03/2005 Document Revised: 06/24/2013 Document Reviewed: 04/15/2013 Elsevier Interactive Patient Education [employer]2016 [employer]

## 2016-01-23 NOTE — ED Provider Notes (Signed)
[facility] Emergency Department Provider Note  ____________________________________________  Time seen: Approximately 1:50 PM  I have reviewed the triage vital signs and the nursing notes.   HISTORY  Chief Complaint Seizures    HPI Keith Chung is a 35 y.o. male with a long-standing history of seizures.  Patient reports today that he was home by himself, but his brother came home and found him on the floor. They thought he had a another seizure, as he had a strange feeling that he gets before his seizures start right previous to this episode. He states he now feels fine. He did not injure himself.  Patient did recently increase his dose of Depakote to 500 mg twice a day from 250. [facility].  No fevers or chills. Reports he's been in normal state of health. Denies any headache, weakness numbness or tingling.   Past Medical History  Diagnosis Date  . Seizures (HCC)   . Learning disorder   . Seasonal allergies     Patient Active Problem List   Diagnosis Date Noted  . Valproic acid toxicity   . Acute delirium 10/17/2015  . Postictal state (HCC) 10/17/2015  . Epilepsy (HCC) 10/17/2015  . Overdose 10/16/2015    Past Surgical History  Procedure Laterality Date  . Vasectomy Bilateral   . Hand surgery      right hand surgery    Current Outpatient Rx  Name  Route  Sig  Dispense  Refill  . divalproex (DEPAKOTE) 500 MG DR tablet   Oral   Take 1 tablet (500 mg total) by mouth 2 (two) times daily.   120 tablet   6   . levETIRAcetam (KEPPRA) 500 MG tablet   Oral   Take 3 tablets (1,500 mg total) by mouth 2 (two) times daily.   180 tablet   6     Allergies Pollen extract  Family History  Problem Relation Age of Onset  . Hypertension Mother   . Diabetes Mellitus II Maternal Grandmother   . Cancer Father     Unsure of type  . Hypertension Father     Social History Social History   Substance Use Topics  . Smoking status: Former Smoker -- 0.30 packs/day    Types: Cigarettes    Quit date: 07/18/2014  . Smokeless tobacco: None     Comment: stopped 4 months ago  . Alcohol Use: No     Comment: socially    Review of Systems Constitutional: No fever/chills Eyes: No visual changes. ENT: No sore throat. Cardiovascular: Denies chest pain. Respiratory: Denies shortness of breath. Gastrointestinal: No abdominal pain.  No nausea, no vomiting.  No diarrhea.  No constipation. Genitourinary: Negative for dysuria. Musculoskeletal: Negative for back pain. Skin: Negative for rash. Neurological: Negative for headaches, focal weakness or numbness.  10-point ROS otherwise negative.  ____________________________________________   PHYSICAL EXAM:  VITAL SIGNS: ED Triage Vitals  Enc Vitals Group     BP 01/23/16 1313 124/86 mmHg     Pulse Rate 01/23/16 1313 69     Resp 01/23/16 1313 18     Temp 01/23/16 1313 97.6 F (36.4 C)     Temp Source 01/23/16 1313 Oral     SpO2 01/23/16 1313 100 %     Weight 01/23/16 1313 175 lb (79.379 kg)     Height 01/23/16 1313 6\' 1"  (1.854 m)     Head Cir --      Peak  Flow --      Pain Score 01/23/16 1314 7     Pain Loc --      Pain Edu? --      Excl. in GC? --    Constitutional: Alert and oriented. Well appearing and in no acute distress. Eyes: Conjunctivae are normal. PERRL. EOMI. Head: Atraumatic. Nose: No congestion/rhinnorhea. Mouth/Throat: Mucous membranes are moist.  Oropharynx non-erythematous. Neck: No stridor.  No cervical tenderness. Cardiovascular: Normal rate, regular rhythm. Grossly normal heart sounds.  Good peripheral circulation. Respiratory: Normal respiratory effort.  No retractions. Lungs CTAB. Gastrointestinal: Soft and nontender. No distention. No abdominal bruits. No CVA tenderness. Musculoskeletal: No lower extremity tenderness nor edema.  No joint effusions. Neurologic:  Normal speech and language. No gross  focal neurologic deficits are appreciated. Normal cranial nerves. No lateral gaze nystagmus. Normal extraocular movements. Normal reflexes brachioradialis bilateral. Normal 5 out of 5 strength and sensation in all extremities. Fully alert. Skin:  Skin is warm, dry and intact. No rash noted. Psychiatric: Mood and affect are normal. Speech and behavior are normal.  ____________________________________________   LABS (all labs ordered are listed, but only abnormal results are displayed)  Labs Reviewed  VALPROIC ACID LEVEL - Abnormal; Notable for the following:    Valproic Acid Lvl 139 (*)    All other components within normal limits  BASIC METABOLIC PANEL   ____________________________________________  EKG   ____________________________________________  RADIOLOGY  Patient denies any acute complaint. No evidence of trauma. No clear indication for CT, has established diagnosis of seizures  seems to be same today. ____________________________________________   PROCEDURES  Procedure(s) performed: None  Critical Care performed: No  ____________________________________________   INITIAL IMPRESSION / ASSESSMENT AND PLAN / ED COURSE  Pertinent labs & imaging results that were available during my care of the patient were reviewed by me and considered in my medical decision making (see chart for details).  Patient presents for evaluation of a seizure. On EMS arrival reportedly alert and oriented without clear postictal state. Patient has had presentations multiple same in the past, reports seeing neurology and having his dose increased of one antiepileptic.  He is currently alert and oriented. Stable and intact neurologic exam. No evidence of trauma. We will send drug level, also metabolic panel is a does present a slight trend in his medication levels recently. No evidence of complication, status epilepticus, or ongoing concern at this time. Discussed further with the patient, and he  will call his neurologist today to discuss and set up another close follow-up visit.  ----------------------------------------- 3:08 PM on 01/23/2016 -----------------------------------------  Case and care discussed with Dr. Thana Farr of neurology who has seen this patient previous. In particular we discussed today's presentation as well as his elevated valproic acid level. She notes that the patient has been seen recently by Dr. Terrace Arabia, and it would appear that the patient's being treated and likely being kept at an elevated Depakote level. Dr. Thad Ranger advises discharge as the patient is not having any symptoms, for which I do not see any symptoms such as altered mental status confusion nystagmus or clear evidence of Depakote toxicity, to follow-up and have levels repeated with his neurologist in the next few days.  Discussed with the patient, he will call Dr. Zannie Cove office and set up a follow-up appointment for this week.  Return precautions and treatment recommendations and follow-up discussed with the patient who is agreeable with the plan.  ____________________________________________   FINAL CLINICAL IMPRESSION(S) / ED DIAGNOSES  Final  diagnoses:  Nonintractable epilepsy without status epilepticus, unspecified epilepsy type [facility])      Sharyn Creamer, MD 01/23/16 2152

## 2016-01-23 NOTE — ED Notes (Signed)
Pt arrived via EMS from home for reports of seizure. EMS reports pt alert and oriented. Pt seen and treated three days ago here for same problem.

## 2016-02-05 ENCOUNTER — Emergency Department
Admission: EM | Admit: 2016-02-05 | Discharge: 2016-02-05 | Disposition: A | Discharge status: Home / Self Care | Payer: Medicaid Other | Attending: Emergency Medicine | Admitting: Emergency Medicine

## 2016-02-05 DIAGNOSIS — Z79899 Other long term (current) drug therapy: Secondary | ICD-10-CM | POA: Diagnosis not present

## 2016-02-05 DIAGNOSIS — G40909 Epilepsy, unspecified, not intractable, without status epilepticus: Secondary | ICD-10-CM | POA: Diagnosis not present

## 2016-02-05 DIAGNOSIS — Z87891 Personal history of nicotine dependence: Secondary | ICD-10-CM | POA: Diagnosis not present

## 2016-02-05 LAB — BASIC METABOLIC PANEL
Anion gap: 4 — ABNORMAL LOW (ref 5–15)
BUN: 8 mg/dL (ref 6–20)
CHLORIDE: 103 mmol/L (ref 101–111)
CO2: 31 mmol/L (ref 22–32)
Calcium: 9 mg/dL (ref 8.9–10.3)
Creatinine, Ser: 1.04 mg/dL (ref 0.61–1.24)
GFR calc Af Amer: >60 mL/min (ref >60)
GFR calc non Af Amer: >60 mL/min (ref >60)
Glucose, Bld: 106 mg/dL — ABNORMAL HIGH (ref 65–99)
POTASSIUM: 4.5 mmol/L (ref 3.5–5.1)
SODIUM: 138 mmol/L (ref 135–145)

## 2016-02-05 LAB — GLUCOSE, CAPILLARY: GLUCOSE-CAPILLARY: 69 mg/dL (ref 65–99)

## 2016-02-05 LAB — VALPROIC ACID LEVEL: Valproic Acid Lvl: 143 ug/mL — ABNORMAL HIGH (ref 50.0–100.0)

## 2016-02-05 MED ORDER — DIVALPROEX SODIUM 250 MG PO DR TAB: 250.0000 mg | DELAYED_RELEASE_TABLET | ORAL | Status: DC

## 2016-02-05 NOTE — ED Notes (Signed)
Pt came to ED via EMS. Pt has history of seizures. Pt reports he was going for morning walk when he began to feel dizzy and sluggish. Unsure if pt had a seizure.

## 2016-02-05 NOTE — Discharge Instructions (Signed)
Please change your depakote (divalproex) dosing to 250mg  each morning, and 500mg  each night. This is a decrease in your morning dose.  Call neurology for follow-up tomorrow. This is very important.  Epilepsy Epilepsy is a disorder in which a person has repeated seizures over time. A seizure is a release of abnormal electrical activity in the brain. Seizures can cause a change in attention, behavior, or the ability to remain awake and alert (altered mental status). Seizures often involve uncontrollable shaking (convulsions).  Most people with epilepsy lead normal lives. However, people with epilepsy are at an increased risk of falls, accidents, and injuries. Therefore, it is important to begin treatment right away. CAUSES  Epilepsy has many possible causes. Anything that disturbs the normal pattern of brain cell activity can lead to seizures. This may include:   Head injury.  Birth trauma.  High fever as a child.  Stroke.  Bleeding into or around the brain.  Certain drugs.  Prolonged low oxygen, such as what occurs after CPR efforts.  Abnormal brain development.  Certain illnesses, such as meningitis, encephalitis (brain infection), malaria, and other infections.  An imbalance of nerve signaling chemicals (neurotransmitters).  SIGNS AND SYMPTOMS  The symptoms of a seizure can vary greatly from one person to another. Right before a seizure, you may have a warning (aura) that a seizure is about to occur. An aura may include the following symptoms:  Fear or anxiety.  Nausea.  Feeling like the room is spinning (vertigo).  Vision changes, such as seeing flashing lights or spots. Common symptoms during a seizure include:  Abnormal sensations, such as an abnormal smell or a bitter taste in the mouth.   Sudden, general body stiffness.   Convulsions that involve rhythmic jerking of the face, arm, or leg on one or both sides.   Sudden change in consciousness.   Appearing to  be awake but not responding.   Appearing to be asleep but cannot be awakened.   Grimacing, chewing, lip smacking, drooling, tongue biting, or loss of bowel or bladder control. After a seizure, you may feel sleepy for a while. DIAGNOSIS  Your health care provider will ask about your symptoms and take a medical history. Descriptions from any witnesses to your seizures will be very helpful in the diagnosis. A physical exam, including a detailed neurological exam, is necessary. Various tests may be done, such as:   An electroencephalogram (EEG). This is a painless test of your brain waves. In this test, a diagram is created of your brain waves. These diagrams can be interpreted by a specialist.  An MRI of the brain.   A CT scan of the brain.   A spinal tap (lumbar puncture, LP).  Blood tests to check for signs of infection or abnormal blood chemistry. TREATMENT  There is no cure for epilepsy, but it is generally treatable. Once epilepsy is diagnosed, it is important to begin treatment as soon as possible. For most people with epilepsy, seizures can be controlled with medicines. The following may also be used:  A pacemaker for the brain (vagus nerve stimulator) can be used for people with seizures that are not well controlled by medicine.  Surgery on the brain. For some people, epilepsy eventually goes away. HOME CARE INSTRUCTIONS   Follow your health care provider's recommendations on driving and safety in normal activities.  Get enough rest. Lack of sleep can cause seizures.  Only take over-the-counter or prescription medicines as directed by your health care provider. Take  any prescribed medicine exactly as directed.  Avoid any known triggers of your seizures.  Keep a seizure diary. Record what you recall about any seizure, especially any possible trigger.   Make sure the people you live and work with know that you are prone to seizures. They should receive instructions on  how to help you. In general, a witness to a seizure should:   Cushion your head and body.   Turn you on your side.   Avoid unnecessarily restraining you.   Not place anything inside your mouth.   Call for emergency medical help if there is any question about what has occurred.   Follow up with your health care provider as directed. You may need regular blood tests to monitor the levels of your medicine.  SEEK MEDICAL CARE IF:   You develop signs of infection or other illness. This might increase the risk of a seizure.   You seem to be having more frequent seizures.   Your seizure pattern is changing.  SEEK IMMEDIATE MEDICAL CARE IF:   You have a seizure that does not stop after a few moments.   You have a seizure that causes any difficulty in breathing.   You have a seizure that results in a very severe headache.   You have a seizure that leaves you with the inability to speak or use a part of your body.    This information is not intended to replace advice given to you by your health care provider. Make sure you discuss any questions you have with your health care provider.   Document Released: 09/03/2005 Document Revised: 06/24/2013 Document Reviewed: 04/15/2013 Elsevier Interactive Patient Education [employer]2016 [employer] Inc[employer]

## 2016-02-05 NOTE — ED Provider Notes (Signed)
[facility] Emergency Department Provider Note  ____________________________________________  Time seen: Approximately 9:45 AM  I have reviewed the triage vital signs and the nursing notes.   HISTORY  Chief Complaint Seizures    HPI Keith Chung is a 35 y.o. male history of seizure disorder.  Patient reports that today he was walking outside when he woke up on the ground. He thinks he had another seizure and was slightly confused after.  He denies any injury or concern. He reports he is continued to be compliant with his medicine and has an upcoming appointment on Tuesday with neurology for an EEG.  Denies any fevers or chills, weakness trouble speaking or other new concerns. He had a mild headache which is gone away which she states is typical.  No chest pain or trouble breathing.  No changes in medications. His last seizure he believes about 3 weeks ago and was seen here in the ER for it.  [facility].   Past Medical History  Diagnosis Date  . Seizures (HCC)   . Learning disorder   . Seasonal allergies     Patient Active Problem List   Diagnosis Date Noted  . Valproic acid toxicity   . Acute delirium 10/17/2015  . Postictal state (HCC) 10/17/2015  . Epilepsy (HCC) 10/17/2015  . Overdose 10/16/2015    Past Surgical History  Procedure Laterality Date  . Vasectomy Bilateral   . Hand surgery      right hand surgery    Current Outpatient Rx  Name  Route  Sig  Dispense  Refill  . divalproex (DEPAKOTE) 250 MG DR tablet   Oral   Take 1 tablet (250 mg total) by mouth as directed. Take 250 mg by mouth every morning (1 tab). take 500 mg by mouth every night (2 tabs)   60 tablet   0   . levETIRAcetam (KEPPRA) 500 MG tablet   Oral   Take 3 tablets (1,500 mg total) by mouth 2 (two) times daily.   180 tablet   6     Allergies Pollen extract  Family History  Problem Relation Age of  Onset  . Hypertension Mother   . Diabetes Mellitus II Maternal Grandmother   . Cancer Father     Unsure of type  . Hypertension Father     Social History Social History  Substance Use Topics  . Smoking status: Former Smoker -- 0.30 packs/day    Types: Cigarettes    Quit date: 07/18/2014  . Smokeless tobacco: None     Comment: stopped 4 months ago  . Alcohol Use: No     Comment: socially    Review of Systems Constitutional: No fever/chills Eyes: No visual changes.No double vision. Denies any twitching in his eyes. ENT: No sore throat. Cardiovascular: Denies chest pain. Respiratory: Denies shortness of breath. Gastrointestinal: No abdominal pain.  No nausea, no vomiting.  No diarrhea.  No constipation. Genitourinary: Negative for dysuria. Musculoskeletal: Negative for back pain. Skin: Negative for rash. Neurological: Negative for  focal weakness or numbness.  10-point ROS otherwise negative.  ____________________________________________   PHYSICAL EXAM:  VITAL SIGNS: ED Triage Vitals  Enc Vitals Group     BP 02/05/16 0937 130/90 mmHg     Pulse Rate 02/05/16 0937 56     Resp 02/05/16 0937 16     Temp 02/05/16 0937 97.6 F (36.4 C)     Temp Source 02/05/16 0937 Oral  SpO2 02/05/16 0937 94 %     Weight 02/05/16 0937 165 lb (74.844 kg)     Height 02/05/16 0937  (1.88 m)     Head Cir --      Peak Flow --      Pain Score --      Pain Loc --      Pain Edu? --      Excl. in GC? --    Constitutional: Alert and oriented. Well appearing and in no acute distress. Eyes: Conjunctivae are normal. PERRL. EOMI.No vertical or non-extinguishing nystagmus. Head: Atraumatic. Nose: No congestion/rhinnorhea. Mouth/Throat: Mucous membranes are moist.  Oropharynx non-erythematous. Neck: No stridor.  No cervical spine tenderness. Cardiovascular: Normal rate, regular rhythm. Grossly normal heart sounds.  Good peripheral circulation. Respiratory: Normal respiratory effort.   No retractions. Lungs CTAB. Gastrointestinal: Soft and nontender. No distention.  Musculoskeletal: No lower extremity tenderness nor edema.  No joint effusions. Neurologic:  Normal speech and language. No gross focal neurologic deficits are appreciated. Skin:  Skin is warm, dry and intact. No rash noted. Psychiatric: Mood and affect are normal. Speech and behavior are normal.  ____________________________________________   LABS (all labs ordered are listed, but only abnormal results are displayed)  Labs Reviewed  VALPROIC ACID LEVEL - Abnormal; Notable for the following:    Valproic Acid Lvl 143 (*)    All other components within normal limits  BASIC METABOLIC PANEL - Abnormal; Notable for the following:    Glucose, Bld 106 (*)    Anion gap 4 (*)    All other components within normal limits  GLUCOSE, CAPILLARY   ____________________________________________  EKG   ____________________________________________  RADIOLOGY   ____________________________________________   PROCEDURES  Procedure(s) performed: None  Critical Care performed: No  ____________________________________________   INITIAL IMPRESSION / ASSESSMENT AND PLAN / ED COURSE  Pertinent labs & imaging results that were available during my care of the patient were reviewed by me and considered in my medical decision making (see chart for details).  Patient presents with probable seizure. He's been evaluated in the ER many times, is currently following with neurology. His valproic acid level is elevated, however and previous discussion it appears that this was planned and followed by neurology. The patient is currently awake and alert without evidence of injury or neurologic deficit. He has a established history of seizure disorder and is currently following up with [facility].  Discussed the patient's case, presentation, and valproic acid level as well as treatment regimen with Dr. Loretha Brasil of  on-call neurology. Recommends decreasing Depakote dose to 250 mg every morning, 500 mg daily at bedtime.  Return precautions and treatment recommendations and follow-up discussed with the patient who is agreeable with the plan.  Patient instructed not to drive, no operating heavy machinery or being dangerous places due to his history of seizures. He will call and set up neurology follow-up with [facility] neurologic tomorrow. This was stressed with him.  ____________________________________________   FINAL CLINICAL IMPRESSION(S) / ED DIAGNOSES  Final diagnoses:  Nonintractable epilepsy without status epilepticus, unspecified epilepsy type (HCC)      Sharyn Creamer, MD 02/05/16 1353

## 2016-02-07 ENCOUNTER — Other Ambulatory Visit: Payer: Self-pay

## 2016-02-07 ENCOUNTER — Telehealth: Payer: Self-pay | Admitting: *Deleted

## 2016-02-07 ENCOUNTER — Telehealth: Payer: Self-pay | Admitting: Neurology

## 2016-02-07 NOTE — Telephone Encounter (Signed)
Taken 23-MAY-17 at  9:29AM by [facility] ------------------------------------------------------------ Keith Chung Keith Chung            CID [id number]  Patient SAME                 Pt's Dr Terrace ArabiaYAN          Area Code 336 Phone# [phone] * DOB [phone]     RE NEEDS TO RESCHEDULE HIS SLEEP STUDY THAT YOU      CANCELLED,ALSO WANTS TO KNOW WHY IT WAS CANCELLED    Disp:Y/N N If Y = C/B If No Response In 20minutes ============================================================

## 2016-02-07 NOTE — Telephone Encounter (Signed)
Pt No showed for his sleep deprived EEG.  [facility]  How would you like to proceed?  Is he to be discharged?

## 2016-02-08 ENCOUNTER — Encounter: Payer: Self-pay | Admitting: Neurology

## 2016-02-08 NOTE — Telephone Encounter (Signed)
Our office actually canceled his appointment because our EEG machine was broken.

## 2016-03-06 ENCOUNTER — Other Ambulatory Visit: Payer: Self-pay

## 2016-03-13 ENCOUNTER — Ambulatory Visit: Payer: Self-pay | Admitting: Neurology

## 2016-03-21 ENCOUNTER — Ambulatory Visit (INDEPENDENT_AMBULATORY_CARE_PROVIDER_SITE_OTHER): Payer: Medicaid Other | Admitting: Neurology

## 2016-03-21 ENCOUNTER — Encounter: Payer: Self-pay | Admitting: Neurology

## 2016-03-21 DIAGNOSIS — G40909 Epilepsy, unspecified, not intractable, without status epilepticus: Secondary | ICD-10-CM

## 2016-03-21 MED ORDER — DIVALPROEX SODIUM 500 MG PO DR TAB: 1000.0000 mg | DELAYED_RELEASE_TABLET | Freq: Two times a day (BID) | ORAL | Status: DC

## 2016-03-21 MED ORDER — LEVETIRACETAM 500 MG PO TABS: 1000.0000 mg | ORAL_TABLET | Freq: Two times a day (BID) | ORAL | Status: DC

## 2016-03-21 NOTE — Patient Instructions (Signed)
No driving until seizure free for 6 months  You should take levetiracetam=keppra  500 mg 2 tablets twice a day Depakote DR 500 mg 2 tablets twice

## 2016-03-21 NOTE — Progress Notes (Signed)
PATIENT: Keith Chung DOB: Nov 30, 1980  No chief complaint on file.    HISTORICAL  Keith Chung is a 35 years old right-handed male, alone at today's clinical visit, seen in refer by emergency room for epilepsy   I have reviewed and summarized multiple ED visit, reviewed multiple laboratory evaluations, UDS was once positive for marijuana, normal CMP, CBC, In January 2017, Depakote level was 188, with Keppra level of 68, but I am not sure what the timing of the antiepileptic medication and the blood sample.   He is a poor historian, per patient, he had epilepsy since childhood, also ADHD, has been taking different antiepileptic medications for long time, also Ritalin, he was [facility], he used to work at [employer] job, operating heavy equipment, last times he went to work was in September 2016,  He had occasionally seizure in the past, but since summer of 2016, he began to have frequent seizure again, it was difficult to get a clear story whether he has been complying with his medications are not, what kind of antiepileptic medications he was taking, He is now taking to Divalproex sodium DR 250 mg twice a day, levetiracetam 500 mg 3 tablets twice a day, most recent seizure was January 06 2016, he woke up, his brother told him he had seizure.  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  MRA of brain was normal  UPDATE July 5th 2017: He continued to have seizure, sometimes preceded by feeling  dizziness, followed by loss consciousness, somewhat times woke up in the teaching did not know what has happened, last seizure was about a month ago in June 2017, he woke up in the bathroom at the [facility] unconscious, he is now living with his younger brother's family, is not driving, he came in to clinic today for sleep deprived EEG study, was confused about his medications,  He has prescription of levetiracetam 500 mg dated April 25-17 by Dr. Scotty CourtStafford, the instruction was to take 3 tablets twice a day, also Divalproex sodium DR 250 mg 1 in the morning, 2 tablets every night, by Dr. Fanny BienQuale date Feb 06 2016, and also new prescription from me Divalpreox acid DR 500 mg twice a day on February 22 2016.  Since last visit on January 10 2016, he was treated at acute care twice, May 8, and may 21st, found on the floor thought he had a seizure, he is now keppra  500 mg 3 tablets in the morning, skip evening dose, Depakote DR 500 mg 1 in the morning, 2 at night, he complains of swimming headedness sometimes, black dots in his visual field, also complains of intermittent chest pain,    REVIEW OF SYSTEMS: Full 14 system review of systems performed and notable only for as above  ALLERGIES: Allergies  Allergen Reactions  . Pollen Extract Other (See Comments)    Runny nose, watery eyes, sneezing    HOME MEDICATIONS: Current Outpatient Prescriptions  Medication Sig Dispense Refill  . divalproex (DEPAKOTE) 250 MG  DR tablet Take 1 tablet (250 mg total) by mouth as directed. Take 250 mg by mouth every morning (1 tab). take 500 mg by mouth every night (2 tabs) 60 tablet 0  . levETIRAcetam (KEPPRA) 500 MG tablet Take 3 tablets (1,500 mg total) by mouth 2 (two) times daily. 180 tablet 6  . [DISCONTINUED] phenytoin (DILANTIN) 100 MG ER capsule Take 1 capsule (100 mg total) by mouth 3 (three) times daily. (Patient not taking: Reported on 05/11/2015) 90 capsule 0   No current facility-administered medications for this  visit.    PAST MEDICAL HISTORY: Past Medical History  Diagnosis Date  . Seizures (HCC)   . Learning disorder   . Seasonal allergies     PAST SURGICAL HISTORY: Past Surgical History  Procedure Laterality Date  . Vasectomy Bilateral   . Hand surgery      right hand surgery    FAMILY HISTORY: Family History  Problem Relation Age of Onset  . Hypertension Mother   . Diabetes Mellitus II Maternal Grandmother   . Cancer Father     Unsure of type  . Hypertension Father     SOCIAL HISTORY:  Social History   Social History  . Marital Status: Single    Spouse Name: N/A  . Number of Children: 4  . Years of Education: GED   Occupational History  . Unemployed    Social History Main Topics  . Smoking status: Former Smoker -- 0.30 packs/day    Types: Cigarettes    Quit date: 07/18/2014  . Smokeless tobacco: Not on file     Comment: stopped 4 months ago  . Alcohol Use: No     Comment: socially  . Drug Use: No     Comment: No longer using - last smoked 04/2015.  Marland Kitchen. Sexual Activity: Yes    Birth Control/ Protection: Surgical   Other Topics Concern  . Not on file   Social History Narrative   Not driving or working due to seizures.   Lives at home with his brother.   Right-handed.   Occasional use of caffeine.     PHYSICAL EXAM   There were no vitals filed for this visit.  Not recorded      There is no weight on file to calculate BMI.  PHYSICAL EXAMNIATION:  Gen: NAD, conversant, well nourised, obese, well groomed                     Cardiovascular: Regular rate rhythm, no peripheral edema, warm, nontender. Eyes: Conjunctivae clear without exudates or hemorrhage Neck: Supple, no carotid bruise. Pulmonary: Clear to auscultation bilaterally   NEUROLOGICAL EXAM:  MENTAL STATUS: Speech:    Speech is normal; fluent and spontaneous with normal comprehension.  Cognition:     Orientation to time, place and person     Normal recent and remote memory      Normal Attention span and concentration     Normal Language, naming, repeating,spontaneous speech     Fund of knowledge   CRANIAL NERVES: CN II: Visual fields are full to confrontation. Fundoscopic exam is normal with sharp discs and no vascular changes. Pupils are round equal and briskly reactive to light. CN III, IV, VI: extraocular movement are normal. No ptosis. CN V: Facial sensation is intact to pinprick in all 3 divisions bilaterally. Corneal responses are intact.  CN VII: Face is symmetric with normal eye closure and smile. CN VIII: Hearing is normal to rubbing fingers CN IX, X:  Palate elevates symmetrically. Phonation is normal. CN XI: Head turning and shoulder shrug are intact CN XII: Tongue is midline with normal movements and no atrophy.  MOTOR: There is no pronator drift of out-stretched arms. Muscle bulk and tone are normal. Muscle strength is normal.  REFLEXES: Reflexes are 2+ and symmetric at the biceps, triceps, knees, and ankles. Plantar responses are flexor.  SENSORY: Intact to light touch, pinprick, positional sensation and vibratory sensation are intact in fingers and toes.  COORDINATION: Rapid alternating movements and fine finger movements are intact. There is no dysmetria on finger-to-nose and heel-knee-shin.    GAIT/STANCE: Posture is normal. Gait is steady with normal steps, base, arm swing, and turning. Heel and toe walking are normal. Tandem gait is normal.  Romberg is absent.   DIAGNOSTIC DATA (LABS, IMAGING, TESTING) - I reviewed patient records, labs, notes, testing and imaging myself where available.   ASSESSMENT AND PLAN  Keith Chung is a 35 y.o. male   Epilepsy    Most recent recurrence was in June 2017  No driving until seizure free for 6 months  I have went over medication with him in detail, he should take Keppra 500 mg 2 tablets twice a day, Depakote DR 500 mg tablets 2 tablets twice a day.  Return to clinic in one month  Levert Feinstein, M.D. Ph.D.  [facility] [address] [place], [place] [address] Ph: [phone] Fax: [phone]  CC: Referring Provider Our office actually canceled his appointment because our EEG machine was broken.

## 2016-03-23 NOTE — Procedures (Signed)
   HISTORY: 35 years old male, with history of seizure at childhood, with recurrent seizure like event  TECHNIQUE:  16 channel EEG was performed based on standard 10-16 international system. One channel was dedicated to EKG, which has demonstrates normal sinus rhythm of 84 beats per minutes.  Upon awakening, the posterior background activity was well-developed, in alpha range, 9 Hz, reactive to eye opening and closure.  There was no evidence of epileptiform discharge.  Photic stimulation was not performed.  Hyperventilation was performed, there was no abnormality elicit.  No sleep was achieved.  CONCLUSION: This is a  normal awake EEG.  There is no electrodiagnostic evidence of epileptiform discharge.  Levert FeinsteinYijun Abbie Jablon, M.D. Ph.D.  [facility] [address] [place], [place] [address] Phone: [phone] Fax:      [phone]

## 2016-03-27 ENCOUNTER — Telehealth: Payer: Self-pay | Admitting: *Deleted

## 2016-03-27 NOTE — Telephone Encounter (Signed)
Records faxed to [facility] on 03/27/2016

## 2016-03-28 ENCOUNTER — Ambulatory Visit: Payer: Self-pay | Admitting: Neurology

## 2016-04-18 ENCOUNTER — Telehealth: Payer: Self-pay | Admitting: Neurology

## 2016-04-18 ENCOUNTER — Observation Stay
Admission: EM | Admit: 2016-04-18 | Discharge: 2016-04-19 | Disposition: A | Discharge status: Home / Self Care | Payer: Medicaid Other | Attending: Specialist | Admitting: Specialist

## 2016-04-18 ENCOUNTER — Encounter: Payer: Self-pay | Admitting: Emergency Medicine

## 2016-04-18 DIAGNOSIS — F819 Developmental disorder of scholastic skills, unspecified: Secondary | ICD-10-CM | POA: Diagnosis not present

## 2016-04-18 DIAGNOSIS — J301 Allergic rhinitis due to pollen: Secondary | ICD-10-CM | POA: Insufficient documentation

## 2016-04-18 DIAGNOSIS — Z8052 Family history of malignant neoplasm of bladder: Secondary | ICD-10-CM | POA: Insufficient documentation

## 2016-04-18 DIAGNOSIS — Z87891 Personal history of nicotine dependence: Secondary | ICD-10-CM | POA: Diagnosis not present

## 2016-04-18 DIAGNOSIS — Z833 Family history of diabetes mellitus: Secondary | ICD-10-CM | POA: Insufficient documentation

## 2016-04-18 DIAGNOSIS — Z9852 Vasectomy status: Secondary | ICD-10-CM | POA: Diagnosis not present

## 2016-04-18 DIAGNOSIS — R569 Unspecified convulsions: Secondary | ICD-10-CM

## 2016-04-18 DIAGNOSIS — G40909 Epilepsy, unspecified, not intractable, without status epilepticus: Principal | ICD-10-CM | POA: Insufficient documentation

## 2016-04-18 DIAGNOSIS — Z8249 Family history of ischemic heart disease and other diseases of the circulatory system: Secondary | ICD-10-CM | POA: Insufficient documentation

## 2016-04-18 DIAGNOSIS — Z79899 Other long term (current) drug therapy: Secondary | ICD-10-CM | POA: Diagnosis not present

## 2016-04-18 LAB — BASIC METABOLIC PANEL
ANION GAP: 7 (ref 5–15)
BUN: 12 mg/dL (ref 6–20)
CO2: 24 mmol/L (ref 22–32)
Calcium: 8.6 mg/dL — ABNORMAL LOW (ref 8.9–10.3)
Chloride: 108 mmol/L (ref 101–111)
Creatinine, Ser: 1.04 mg/dL (ref 0.61–1.24)
GFR calc non Af Amer: >60 mL/min (ref >60)
GLUCOSE: 91 mg/dL (ref 65–99)
POTASSIUM: 3.8 mmol/L (ref 3.5–5.1)
Sodium: 139 mmol/L (ref 135–145)

## 2016-04-18 LAB — CBC
HCT: 38.8 % — ABNORMAL LOW (ref 40.0–52.0)
Hemoglobin: 13.7 g/dL (ref 13.0–18.0)
MCH: 30.1 pg (ref 26.0–34.0)
MCHC: 35.4 g/dL (ref 32.0–36.0)
MCV: 85 fL (ref 80.0–100.0)
PLATELETS: 214 10*3/uL (ref 150–440)
RBC: 4.56 MIL/uL (ref 4.40–5.90)
RDW: 12.4 % (ref 11.5–14.5)
WBC: 4.4 10*3/uL (ref 3.8–10.6)

## 2016-04-18 LAB — VALPROIC ACID LEVEL: Valproic Acid Lvl: 65 ug/mL (ref 50.0–100.0)

## 2016-04-18 LAB — GLUCOSE, CAPILLARY: Glucose-Capillary: 87 mg/dL (ref 65–99)

## 2016-04-18 MED ORDER — DIVALPROEX SODIUM 500 MG PO DR TAB
1000.0000 mg | DELAYED_RELEASE_TABLET | ORAL | Status: AC
  Administered 2016-04-18: 1000 mg via ORAL
  Filled 2016-04-18: qty 2

## 2016-04-18 MED ORDER — ONDANSETRON HCL 4 MG/2ML IJ SOLN: 4.0000 mg | Freq: Four times a day (QID) | INTRAMUSCULAR | Status: DC | PRN

## 2016-04-18 MED ORDER — ACETAMINOPHEN 325 MG PO TABS
650.0000 mg | ORAL_TABLET | Freq: Four times a day (QID) | ORAL | Status: DC | PRN
  Administered 2016-04-18: 650 mg via ORAL
  Filled 2016-04-18: qty 2

## 2016-04-18 MED ORDER — ENOXAPARIN SODIUM 40 MG/0.4ML ~~LOC~~ SOLN
40.0000 mg | SUBCUTANEOUS | Status: DC
  Administered 2016-04-18: 40 mg via SUBCUTANEOUS
  Filled 2016-04-18: qty 0.4

## 2016-04-18 MED ORDER — DIVALPROEX SODIUM 250 MG PO DR TAB
1000.0000 mg | DELAYED_RELEASE_TABLET | Freq: Two times a day (BID) | ORAL | Status: DC
  Administered 2016-04-18 – 2016-04-19 (×2): 1000 mg via ORAL
  Filled 2016-04-18 (×2): qty 4

## 2016-04-18 MED ORDER — ACETAMINOPHEN 650 MG RE SUPP: 650.0000 mg | Freq: Four times a day (QID) | RECTAL | Status: DC | PRN

## 2016-04-18 MED ORDER — LORAZEPAM 2 MG/ML IJ SOLN
INTRAMUSCULAR | Status: AC
  Administered 2016-04-18: 1 mg via INTRAVENOUS
  Filled 2016-04-18: qty 1

## 2016-04-18 MED ORDER — ONDANSETRON HCL 4 MG PO TABS: 4.0000 mg | ORAL_TABLET | Freq: Four times a day (QID) | ORAL | Status: DC | PRN

## 2016-04-18 MED ORDER — LORAZEPAM 2 MG/ML IJ SOLN
1.0000 mg | INTRAMUSCULAR | Status: AC
  Administered 2016-04-18: 1 mg via INTRAVENOUS

## 2016-04-18 MED ORDER — LEVETIRACETAM 500 MG PO TABS
1000.0000 mg | ORAL_TABLET | Freq: Two times a day (BID) | ORAL | Status: DC
  Administered 2016-04-18: 1000 mg via ORAL
  Filled 2016-04-18: qty 2

## 2016-04-18 NOTE — ED Notes (Signed)
RN at bedside to discharge pt. Left arm twitching. Pt not responding to verbal stimuli. Did withdraw to nail bed pressure. After 15-20 seconds opened eyes. Remains oriented. Reports still seeing black spots.

## 2016-04-18 NOTE — ED Notes (Signed)
Waiting on admit bed  

## 2016-04-18 NOTE — Telephone Encounter (Signed)
I was called by [facility] regional emergency room (Dr. Sharyn Creamer) that he may have had a couple seizures earlier today. His Depakote level was 63. He is also on Keppra. Most of his Depakote levels have been above 100.       I advised that he receive 1000 mg Depakote IV or PO and then resume his 1000 mg twice a day dose. He can call to get a follow-up appointment with Dr. Terrace Arabia.

## 2016-04-18 NOTE — Discharge Instructions (Signed)
Continue your current medications as recommended by Dr. Terrace Arabia.  You have been seen in the emergency department today for a seizure.  Your workup today including labs are within normal limits.  Please follow up with your doctor/neurologist as soon as possible regarding today's emergency department visit and your likely seizure.  As we have discussed it is very important that you do not drive until you have been seen and cleared by your neurologist.  Please drink plenty of fluids, get plenty of sleep and avoid any alcohol or drug use Please return to the emergency department if you have any further seizures which do not respond to medications, or for any other symptoms per se concerning for yourself.

## 2016-04-18 NOTE — ED Notes (Signed)
Called Doug in [place] for transfer [phone]

## 2016-04-18 NOTE — H&P (Signed)
[facility] [place] [facility]   PATIENT NAME: Keith Chung    MR#:  [id number]  DATE OF BIRTH:  March 12, 1981   DATE OF ADMISSION:  04/18/2016  PRIMARY CARE PHYSICIAN: Pcp Not In System  [facility]  REQUESTING/REFERRING PHYSICIAN: Quale  CHIEF COMPLAINT:   Chief Complaint  Patient presents with  . Seizures    HISTORY OF PRESENT ILLNESS:  Keith Chung  is a 35 y.o. male with a known history of Seizure disorder presenting with seizures 2. Patient states last seizure activity approximately 1 month ago however had repeat episode today that had further witnessed episodes of seizures with EMS. His Depakote level was noted to be low however did not subtherapeutic, but low for his usual state. Load with this medication in emergency department.  PAST MEDICAL HISTORY:   Past Medical History:  Diagnosis Date  . Learning disorder   . Seasonal allergies   . Seizures (HCC)     PAST SURGICAL HISTORY:   Past Surgical History:  Procedure Laterality Date  . HAND SURGERY     right hand surgery  . VASECTOMY Bilateral     SOCIAL HISTORY:   Social History  Substance Use Topics  . Smoking status: Former Smoker    Packs/day: 0.30    Types: Cigarettes    Quit date: 07/18/2014  . Smokeless tobacco: Never Used     Comment: stopped 4 months ago  . Alcohol use No     Comment: socially    FAMILY HISTORY:   Family History  Problem Relation Age of Onset  . Cancer Father     Unsure of type  . Hypertension Father   . Hypertension Mother   . Diabetes Mellitus II Maternal Grandmother     DRUG ALLERGIES:   Allergies  Allergen Reactions  . Pollen Extract Other (See Comments)    Runny nose, watery eyes, sneezing    REVIEW OF SYSTEMS:  REVIEW OF SYSTEMS:  CONSTITUTIONAL: Denies fevers, chills, fatigue, weakness.  EYES: Denies blurred vision, double vision, or eye pain.  EARS, NOSE, THROAT: Denies tinnitus, ear pain, hearing loss.    RESPIRATORY: denies cough, shortness of breath, wheezing  CARDIOVASCULAR: Denies chest pain, palpitations, edema.  GASTROINTESTINAL: Denies nausea, vomiting, diarrhea, abdominal pain.  GENITOURINARY: Denies dysuria, hematuria.  ENDOCRINE: Denies nocturia or thyroid problems. HEMATOLOGIC AND LYMPHATIC: Denies easy bruising or bleeding.  SKIN: Denies rash or lesions.  MUSCULOSKELETAL: Denies pain in neck, back, shoulder, knees, hips, or further arthritic symptoms.  NEUROLOGIC: Denies paralysis, paresthesias.  PSYCHIATRIC: Denies anxiety or depressive symptoms. Otherwise full review of systems performed by me is negative.   MEDICATIONS AT HOME:   Prior to Admission medications   Medication Sig Start Date End Date Taking? Authorizing Provider  divalproex (DEPAKOTE) 500 MG DR tablet Take 2 tablets (1,000 mg total) by mouth 2 (two) times daily. 03/21/16   Levert Feinstein, MD  levETIRAcetam (KEPPRA) 500 MG tablet Take 2 tablets (1,000 mg total) by mouth 2 (two) times daily. 03/21/16   Levert Feinstein, MD      VITAL SIGNS:  Blood pressure 117/82, pulse 65, temperature 98.3 F (36.8 C), temperature source Oral, resp. rate 12, height 6\' 2"  (1.88 m), weight 165 lb (74.8 kg), SpO2 100 %.  PHYSICAL EXAMINATION:  VITAL SIGNS: Vitals:   04/18/16 1630 04/18/16 1700  BP: (!) 102/91 117/82  Pulse:  65  Resp: 17 12  Temp:     GENERAL:34 y.o.male currently in no acute distress.  HEAD: Normocephalic, atraumatic.  EYES: Pupils equal, round, reactive to light. Extraocular muscles intact. No scleral icterus.  MOUTH: Moist mucosal membrane. Dentition intact. No abscess noted.  EAR, NOSE, THROAT: Clear without exudates. No external lesions.  NECK: Supple. No thyromegaly. No nodules. No JVD.  PULMONARY: Clear to ascultation, without wheeze rails or rhonci. No use of accessory muscles, Good respiratory effort. good air entry bilaterally CHEST: Nontender to palpation.  CARDIOVASCULAR: S1 and S2. Regular rate and  rhythm. No murmurs, rubs, or gallops. No edema. Pedal pulses 2+ bilaterally.  GASTROINTESTINAL: Soft, nontender, nondistended. No masses. Positive bowel sounds. No hepatosplenomegaly.  MUSCULOSKELETAL: No swelling, clubbing, or edema. Range of motion full in all extremities.  NEUROLOGIC: Cranial nerves II through XII are intact. No gross focal neurological deficits. Sensation intact. Reflexes intact.  SKIN: No ulceration, lesions, rashes, or cyanosis. Skin warm and dry. Turgor intact.  PSYCHIATRIC: Mood, affect within normal limits.Responses somewhat slowed The patient is awake, alert and oriented x 3. Insight, judgment intact.    LABORATORY PANEL:   CBC No results for input(s): WBC, HGB, HCT, PLT in the last 168 hours. ------------------------------------------------------------------------------------------------------------------  Chemistries   Recent Labs Lab 04/18/16 1354  NA 139  K 3.8  CL 108  CO2 24  GLUCOSE 91  BUN 12  CREATININE 1.04  CALCIUM 8.6*   ------------------------------------------------------------------------------------------------------------------  Cardiac Enzymes No results for input(s): TROPONINI in the last 168 hours. ------------------------------------------------------------------------------------------------------------------  RADIOLOGY:  No results found.  EKG:   Orders placed or performed during the hospital encounter of 01/20/16  . EKG 12-Lead  . EKG 12-Lead    IMPRESSION AND PLAN:   35 year old African American gentleman history of seizure disorder presenting with seizure activity  1. Seizures: Subtherapeutic levels restart medications after bolus, consult neurology, seizure precautions, 2. Venous thrombi embolism prophylactic: Lovenox    All the records are reviewed and case discussed with ED provider. Management plans discussed with the patient, family and they are in agreement.  CODE STATUS: Full  TOTAL TIME TAKING CARE  OF THIS PATIENT: 33 minutes.    Hower,  Mardi Mainland.D on 04/18/2016 at 5:23 PM  Between 7am to 6pm - Pager - [phone]  After 6pm: House Pager: - [phone]  [facility]  Office  [phone]  CC: Primary care physician; Pcp Not In System

## 2016-04-18 NOTE — ED Notes (Signed)
Admitting MD at bedside.

## 2016-04-18 NOTE — Progress Notes (Signed)
Pt came to floor at 1840. VSS. Pt was Oriented to floor and safety plan.

## 2016-04-18 NOTE — ED Notes (Signed)
Pt sleeping. 

## 2016-04-18 NOTE — ED Notes (Signed)
Spoke with pts mom per pts request. Informed her of transfer to [place].

## 2016-04-18 NOTE — Progress Notes (Signed)
Pt's medication and medicaid card are at bedside in white bag.

## 2016-04-18 NOTE — ED Notes (Signed)
Pt remains alert and oriented X 3.

## 2016-04-18 NOTE — ED Triage Notes (Signed)
Pt had 2 20-30 sec full body seizures today. Pt was in jail and reports did not get seizure meds there; not sure when out. Has been taking seizure meds now. On depakote and keppra

## 2016-04-18 NOTE — ED Provider Notes (Addendum)
[facility] Emergency Department Provider Note ____________________________________________   First MD Initiated Contact with Patient 04/18/16 1354     (approximate)  I have reviewed the triage vital signs and the nursing notes.   HISTORY  Chief Complaint Seizures  HPI Keith Chung. is a 35 y.o. male history of seizure disorder.  Patient reports to me that he was in jail for the second time this year, and that while in their having gotten out just a couple weeks ago he is not given his normal seizure medicines. He started retaking them about a week ago, and reports he is taking them as prescribed by neurology in [place].   Today the patient was walking out of his attorney's office, while walking he felt like he started to see spots and then awoke on the ground. Reportedly had a witnessed seizure, then while in the presence of fire department he had additional shaking episode, but was noted to have good orientation within 30 seconds of the episodes stopping. While at the EMS he had another episode of shaking, eyes rolled back in his head, and he was given Valium with good effect.  Patient presently reports he feels okay. He would like to know why these episodes keep recurring to him. He reports he states he is medicines normally. Denies headache or injury. No fevers, no chest pain, no neck pain, reports he feels well at this time.  He does not drive, and tells me he knows not to go places where he could harm himself if he were to have a seizure.  Past Medical History:  Diagnosis Date  . Learning disorder   . Seasonal allergies   . Seizures [facility])     Patient Active Problem List   Diagnosis Date Noted  . Valproic acid toxicity   . Acute delirium 10/17/2015  . Postictal state (HCC) 10/17/2015  . Epilepsy (HCC) 10/17/2015  . Overdose 10/16/2015    Past Surgical History:  Procedure Laterality Date  . HAND SURGERY     right hand surgery  .  VASECTOMY Bilateral     Prior to Admission medications   Medication Sig Start Date End Date Taking? Authorizing Provider  divalproex (DEPAKOTE) 500 MG DR tablet Take 2 tablets (1,000 mg total) by mouth 2 (two) times daily. 03/21/16   Levert Feinstein, MD  levETIRAcetam (KEPPRA) 500 MG tablet Take 2 tablets (1,000 mg total) by mouth 2 (two) times daily. 03/21/16   Levert Feinstein, MD    Allergies Pollen extract  Family History  Problem Relation Age of Onset  . Cancer Father     Unsure of type  . Hypertension Father   . Hypertension Mother   . Diabetes Mellitus II Maternal Grandmother     Social History Social History  Substance Use Topics  . Smoking status: Former Smoker    Packs/day: 0.30    Types: Cigarettes    Quit date: 07/18/2014  . Smokeless tobacco: Not on file     Comment: stopped 4 months ago  . Alcohol use No     Comment: socially    Review of Systems Constitutional: No fever/chills Eyes: No visual changes. ENT: No sore throat. Cardiovascular: Denies chest pain. Respiratory: Denies shortness of breath. Gastrointestinal: No abdominal pain.  No nausea, no vomiting.  Genitourinary: Negative for dysuria. Musculoskeletal: Negative for back pain. Skin: Negative for rash. Neurological: Negative for headaches, focal weakness or numbness.See history of present illness regarding seizure  10-point ROS otherwise negative.  ____________________________________________  PHYSICAL EXAM:  VITAL SIGNS: ED Triage Vitals [04/18/16 1357]  Enc Vitals Group     BP 132/88     Pulse Rate 74     Resp 18     Temp 98.3 F (36.8 C)     Temp Source Oral     SpO2 98 %     Weight 165 lb (74.8 kg)     Height  (1.88 m)     Head Circumference      Peak Flow      Pain Score      Pain Loc      Pain Edu?      Excl. in GC?     Constitutional: Alert and oriented. Well appearing and in no acute distress.Pleasant. Conversant. Eyes: Conjunctivae are normal. PERRL. EOMI. Head:  Atraumatic. Nose: No congestion/rhinnorhea. Mouth/Throat: Mucous membranes are moist.  Oropharynx non-erythematous. Neck: No stridor.  No cervical spine tenderness Cardiovascular: Normal rate, regular rhythm. Grossly normal heart sounds.  Good peripheral circulation. Respiratory: Normal respiratory effort.  No retractions. Lungs CTAB. Gastrointestinal: Soft and nontender. No distention.  Musculoskeletal: No lower extremity tenderness nor edema.  Neurologic:  Normal speech and language. No gross focal neurologic deficits are appreciated. No pronator drift. Normal cranial nerve exam. Normal orientation.  Skin:  Skin is warm, dry and intact. No rash noted. Psychiatric: Mood and affect are normal. Speech and behavior are normal.  ____________________________________________   LABS (all labs ordered are listed, but only abnormal results are displayed)  Labs Reviewed  BASIC METABOLIC PANEL - Abnormal; Notable for the following:       Result Value   Calcium 8.6 (*)    All other components within normal limits  VALPROIC ACID LEVEL   ____________________________________________  EKG   ____________________________________________  RADIOLOGY  Patient has had neuro imaging including MRI. No evidence of injury or trauma today. ____________________________________________   PROCEDURES  Procedure(s) performed: None  Procedures  Critical Care performed: No  ____________________________________________   INITIAL IMPRESSION / ASSESSMENT AND PLAN / ED COURSE  Pertinent labs & imaging results that were available during my care of the patient were reviewed by me and considered in my medical decision making (see chart for details).  Patient presents with what may be recurrent seizures. He certainly has an established history of evaluation for seizures, and saw Dr. Terrace Arabia at [facility] neurology recently. He will continue on his current medications reports compliance. I discussed his case  with Dr. Epimenio Foot, of neurology, and he recommends giving 1 g of Depakote by mouth now and discharging the patient to continue on his current outpatient medicine. He recommends scheduling follow-up with Dr. Terrace Arabia, and typical seizure precautions. I discussed with the patient who is agreeable. Return precautions and treatment recommendations and follow-up discussed with the patient who is agreeable with the plan.   Clinical Course     ____________________________________________   FINAL CLINICAL IMPRESSION(S) / ED DIAGNOSES  Final diagnoses:  Seizure (HCC)      NEW MEDICATIONS STARTED DURING THIS VISIT:  New Prescriptions   No medications on file     Note:  This document was prepared using Dragon voice recognition software and may include unintentional dictation errors.     Sharyn Creamer, MD 04/18/16 1554   ----------------------------------------- 5:15 PM on 04/18/2016 -----------------------------------------  Patient was being discharged, but he then pressed the call button and on arrival to the room was noted to have shaking his left arm. Pressure over the right hand nail bed did arouse  him, and his alertness appears improved.  Discussed with [facility], who advises that they would not perform EEG monitoring tonight and recommended he be admitted here to our internal medicine service for observation due to recurrent seizures. As the patient's mental status returned to baseline between episodes the report there is no chance the patient is in status epilepticus. Discussed with the patient and he will be admitted for recurrent seizures, multiple occurring today. She is agreeable. Awake and alert.   Sharyn Creamer, MD 04/18/16 2134

## 2016-04-19 DIAGNOSIS — R569 Unspecified convulsions: Secondary | ICD-10-CM | POA: Diagnosis not present

## 2016-04-19 LAB — VALPROIC ACID LEVEL: Valproic Acid Lvl: 112 ug/mL — ABNORMAL HIGH (ref 50.0–100.0)

## 2016-04-19 MED ORDER — LORAZEPAM 2 MG/ML IJ SOLN
INTRAMUSCULAR | Status: AC
  Filled 2016-04-19: qty 1

## 2016-04-19 MED ORDER — LORAZEPAM 2 MG/ML IJ SOLN
1.0000 mg | Freq: Once | INTRAMUSCULAR | Status: AC
  Administered 2016-04-19: 1 mg via INTRAVENOUS

## 2016-04-19 MED ORDER — LEVETIRACETAM 750 MG PO TABS
1500.0000 mg | ORAL_TABLET | Freq: Two times a day (BID) | ORAL | Status: DC
  Administered 2016-04-19: 1500 mg via ORAL
  Filled 2016-04-19: qty 2

## 2016-04-19 MED ORDER — DIVALPROEX SODIUM 500 MG PO DR TAB: DELAYED_RELEASE_TABLET | ORAL | 11 refills | Status: DC

## 2016-04-19 NOTE — Progress Notes (Signed)
Pt called out to front desk and stated "he feels like he is going to have a seizure". RN immediately entered room and pt was unresponsive and eyes were closed and legs were slightly shaking, no other seizures symptoms were present. Placed on side and suction obtained as a precaution. MD immediately called and VS obtained. VS were within normal limits. MD ordered one time dose of ativan 1mg . Pt now post ictal and opens eyes on command and can verbalize minimally. VS checked again and remained stable. Will continue to closely monitor.

## 2016-04-19 NOTE — Discharge Summary (Signed)
[facility] [place] [facility]   PATIENT NAME: Keith Chung    MR#:  [id number]  DATE OF BIRTH:  11-11-1980  DATE OF ADMISSION:  04/18/2016 ADMITTING PHYSICIAN: Wyatt Haste, MD  DATE OF DISCHARGE: 04/19/2016  PRIMARY CARE PHYSICIAN: Pcp Not In System    ADMISSION DIAGNOSIS:  Seizure (HCC) [R56.9]  DISCHARGE DIAGNOSIS:  Active Problems:   Seizure (HCC)   SECONDARY DIAGNOSIS:   Past Medical History:  Diagnosis Date  . Learning disorder   . Seasonal allergies   . Seizures [facility])     HOSPITAL COURSE:   35 year old male with past history of seizures, learning disorder who presented to the hospital due to recurrent seizures.  1. Seizures-patient presented to the hospital due to recurrent seizures. He is already on antiepileptics with Depakote and Keppra. He was given extra loading dose of Depakote. -His Depakote level has improved with the bolus. It increased from 65-112. -He was seen by neurology recommended increasing his Depakote from thousand twice a day to thousand milligrams in a.m., 1250 at bedtime. He will continue his same dose of Keppra. -He is clinically stable and has been seizure free since being admitted to the hospital and not being discharged home with follow-up with neurology as an outpatient.  DISCHARGE CONDITIONS:   Stable  CONSULTS OBTAINED:  Treatment Team:  Wyatt Haste, MD Pauletta Browns, MD  DRUG ALLERGIES:   Allergies  Allergen Reactions  . Pollen Extract Other (See Comments)    Runny nose, watery eyes, sneezing    DISCHARGE MEDICATIONS:     Medication List    TAKE these medications   divalproex 500 MG DR tablet Commonly known as:  DEPAKOTE Please take 1000 mg in a.m. And 1250 mg at bedtime. What changed:  how much to take  how to take this  when to take this  additional instructions   levETIRAcetam 500 MG tablet Commonly known as:  KEPPRA Take 1,500 mg by mouth 2 (two) times daily.          DISCHARGE INSTRUCTIONS:   DIET:  Regular diet  DISCHARGE CONDITION:  Stable  ACTIVITY:  Activity as tolerated  OXYGEN:  Home Oxygen: No.   Oxygen Delivery: room air  DISCHARGE LOCATION:  home   If you experience worsening of your admission symptoms, develop shortness of breath, life threatening emergency, suicidal or homicidal thoughts you must seek medical attention immediately by calling 911 or calling your MD immediately  if symptoms less severe.  You Must read complete instructions/literature along with all the possible adverse reactions/side effects for all the Medicines you take and that have been prescribed to you. Take any new Medicines after you have completely understood and accpet all the possible adverse reactions/side effects.   Please note  You were cared for by a hospitalist during your hospital stay. If you have any questions about your discharge medications or the care you received while you were in the hospital after you are discharged, you can call the unit and asked to speak with the hospitalist on call if the hospitalist that took care of you is not available. Once you are discharged, your primary care physician will handle any further medical issues. Please note that NO REFILLS for any discharge medications will be authorized once you are discharged, as it is imperative that you return to your primary care physician (or establish a relationship with a primary care physician if you do not have one) for your aftercare needs so that  they can reassess your need for medications and monitor your lab values.     Today   No acute seizures today overnight. Clinically stable.  VITAL SIGNS:  Blood pressure (!) 115/55, pulse 63, temperature 97.9 F (36.6 C), temperature source Oral, resp. rate 17, height 6\' 2"  (1.88 m), weight 74.8 kg (165 lb), SpO2 100 %.  I/O:   Intake/Output Summary (Last 24 hours) at 04/19/16 1524 Last data filed at 04/19/16 1221   Gross per 24 hour  Intake              300 ml  Output              525 ml  Net             -225 ml    PHYSICAL EXAMINATION:  GENERAL:  35 y.o.-year-old patient lying in the bed with no acute distress.  EYES: Pupils equal, round, reactive to light and accommodation. No scleral icterus. Extraocular muscles intact.  HEENT: Head atraumatic, normocephalic. Oropharynx and nasopharynx clear.  NECK:  Supple, no jugular venous distention. No thyroid enlargement, no tenderness.  LUNGS: Normal breath sounds bilaterally, no wheezing, rales,rhonchi. No use of accessory muscles of respiration.  CARDIOVASCULAR: S1, S2 normal. No murmurs, rubs, or gallops.  ABDOMEN: Soft, non-tender, non-distended. Bowel sounds present. No organomegaly or mass.  EXTREMITIES: No pedal edema, cyanosis, or clubbing.  NEUROLOGIC: Cranial nerves II through XII are intact. No focal motor or sensory defecits b/l.  PSYCHIATRIC: The patient is alert and oriented x 3. Good affect.  SKIN: No obvious rash, lesion, or ulcer.   DATA REVIEW:   CBC  Recent Labs Lab 04/18/16 1742  WBC 4.4  HGB 13.7  HCT 38.8*  PLT 214    Chemistries   Recent Labs Lab 04/18/16 1354  NA 139  K 3.8  CL 108  CO2 24  GLUCOSE 91  BUN 12  CREATININE 1.04  CALCIUM 8.6*    Cardiac Enzymes No results for input(s): TROPONINI in the last 168 hours.  Microbiology Results  No results found for this or any previous visit.  RADIOLOGY:  No results found.    Management plans discussed with the patient, family and they are in agreement.  CODE STATUS:     Code Status Orders        Start     Ordered   04/18/16 1712  Full code  Continuous     04/18/16 1712    Code Status History    Date Active Date Inactive Code Status Order ID Comments User Context   10/16/2015  6:09 PM 10/18/2015  5:36 PM Full Code [id number]  Enid Baas, MD Inpatient      TOTAL TIME TAKING CARE OF THIS PATIENT: 40 minutes.    Houston Siren M.D on  04/19/2016 at 3:24 PM  Between 7am to 6pm - Pager - [phone]  After 6pm go to [email or web address] - password [email or web address]  [facility]  Office  [phone]  CC: Primary care physician; Pcp Not In System

## 2016-04-19 NOTE — Consult Note (Signed)
CC: seizures   HPI: Keith Chung. is an 35 y.o. male with a known history of Seizure disorder presenting with seizures 2. Patient states last seizure activity approximately 1 month ago however had repeat episode today that had further witnessed episodes of seizures with EMS. States compliant with medications. On keppra 1500 BID and VPA 1000 BID  Past Medical History:  Diagnosis Date  . Learning disorder   . Seasonal allergies   . Seizures (HCC)     Past Surgical History:  Procedure Laterality Date  . HAND SURGERY     right hand surgery  . VASECTOMY Bilateral     Family History  Problem Relation Age of Onset  . Cancer Father     Unsure of type  . Hypertension Father   . Hypertension Mother   . Diabetes Mellitus II Maternal Grandmother     Social History:  reports that he quit smoking about 21 months ago. His smoking use included Cigarettes. He smoked 0.30 packs per day. He has never used smokeless tobacco. He reports that he does not drink alcohol or use drugs.  Allergies  Allergen Reactions  . Pollen Extract Other (See Comments)    Runny nose, watery eyes, sneezing    Medications: I have reviewed the patient's current medications.  ROS: History obtained from the patient  General ROS: negative for - chills, fatigue, fever, night sweats, weight gain or weight loss Psychological ROS: negative for - behavioral disorder, hallucinations, memory difficulties, mood swings or suicidal ideation Ophthalmic ROS: negative for - blurry vision, double vision, eye pain or loss of vision ENT ROS: negative for - epistaxis, nasal discharge, oral lesions, sore throat, tinnitus or vertigo Allergy and Immunology ROS: negative for - hives or itchy/watery eyes Hematological and Lymphatic ROS: negative for - bleeding problems, bruising or swollen lymph nodes Endocrine ROS: negative for - galactorrhea, hair pattern changes, polydipsia/polyuria or temperature intolerance Respiratory  ROS: negative for - cough, hemoptysis, shortness of breath or wheezing Cardiovascular ROS: negative for - chest pain, dyspnea on exertion, edema or irregular heartbeat Gastrointestinal ROS: negative for - abdominal pain, diarrhea, hematemesis, nausea/vomiting or stool incontinence Genito-Urinary ROS: negative for - dysuria, hematuria, incontinence or urinary frequency/urgency Musculoskeletal ROS: negative for - joint swelling or muscular weakness Neurological ROS: as noted in HPI Dermatological ROS: negative for rash and skin lesion changes  Physical Examination: Blood pressure (!) 115/55, pulse 63, temperature 97.9 F (36.6 C), temperature source Oral, resp. rate 17, height  (1.88 m), weight 74.8 kg (165 lb), SpO2 100 %.    Neurological Examination Mental Status: Alert, oriented, thought content appropriate.  Speech fluent without evidence of aphasia.  Able to follow 3 step commands without difficulty. Cranial Nerves: II: Discs flat bilaterally; Visual fields grossly normal, pupils equal, round, reactive to light and accommodation III,IV, VI: ptosis not present, extra-ocular motions intact bilaterally V,VII: smile symmetric, facial light touch sensation normal bilaterally VIII: hearing normal bilaterally IX,X: gag reflex present XI: bilateral shoulder shrug XII: midline tongue extension Motor: Right : Upper extremity   5/5    Left:     Upper extremity   5/5  Lower extremity   4/5     Lower extremity   4/5 Tone and bulk:normal tone throughout; no atrophy noted Sensory: Pinprick and light touch intact throughout, bilaterally Deep Tendon Reflexes: 1+ and symmetric throughout Plantars: Right: downgoing   Left: downgoing Cerebellar: normal finger-to-nose, normal rapid alternating movements and normal heel-to-shin test Gait: not tested  Laboratory Studies:   Basic Metabolic Panel:  Recent Labs Lab 04/18/16 1354  NA 139  K 3.8  CL 108  CO2 24  GLUCOSE 91  BUN 12   CREATININE 1.04  CALCIUM 8.6*    Liver Function Tests: No results for input(s): AST, ALT, ALKPHOS, BILITOT, PROT, ALBUMIN in the last 168 hours. No results for input(s): LIPASE, AMYLASE in the last 168 hours. No results for input(s): AMMONIA in the last 168 hours.  CBC:  Recent Labs Lab 04/18/16 1742  WBC 4.4  HGB 13.7  HCT 38.8*  MCV 85.0  PLT 214    Cardiac Enzymes: No results for input(s): CKTOTAL, CKMB, CKMBINDEX, TROPONINI in the last 168 hours.  BNP: Invalid input(s): POCBNP  CBG:  Recent Labs Lab 04/18/16 1720  GLUCAP 87    Microbiology: No results found for this or any previous visit.  Coagulation Studies: No results for input(s): LABPROT, INR in the last 72 hours.  Urinalysis: No results for input(s): COLORURINE, LABSPEC, PHURINE, GLUCOSEU, HGBUR, BILIRUBINUR, KETONESUR, PROTEINUR, UROBILINOGEN, NITRITE, LEUKOCYTESUR in the last 168 hours.  Invalid input(s): APPERANCEUR  Lipid Panel:     Component Value Date/Time   CHOL 150 11/02/2015   TRIG 69 11/02/2015   HDL 54 11/02/2015   LDLCALC 82 11/02/2015    HgbA1C: No results found for: HGBA1C  Urine Drug Screen:     Component Value Date/Time   LABOPIA NONE DETECTED 01/05/2016 1143   LABOPIA NONE DETECTED 05/12/2015 1752   COCAINSCRNUR NONE DETECTED 01/05/2016 1143   LABBENZ NONE DETECTED 01/05/2016 1143   LABBENZ NONE DETECTED 05/12/2015 1752   AMPHETMU NONE DETECTED 01/05/2016 1143   AMPHETMU NONE DETECTED 05/12/2015 1752   THCU NONE DETECTED 01/05/2016 1143   THCU POSITIVE (A) 05/12/2015 1752   LABBARB NONE DETECTED 01/05/2016 1143   LABBARB NONE DETECTED 05/12/2015 1752    Alcohol Level: No results for input(s): ETH in the last 168 hours.  Other results: EKG: normal EKG, normal sinus rhythm, unchanged from previous tracings.  Imaging: No results found.   Assessment/Plan: 35 y.o. male with a known history of Seizure disorder presenting with seizures 2. Patient states last  seizure activity approximately 1 month ago however had repeat episode today that had further witnessed episodes of seizures with EMS. States compliant with medications. On keppra 1500 BID and VPA 1000 BID  VPA level is 65 s/p load 1grm and today level is 112  - Con't keppra 1500 BID - Con't Vpa 1gm in AM and increase to 1250 at night.  - d/c planning   04/19/2016, 2:19 PM

## 2016-04-19 NOTE — Progress Notes (Signed)
Patient states that [facility]. Patient also that he was in [place] [facility] for 2 months and did not receive his seizure medication while in jail. Patient is alert and oriented no acute distress noted, Seizure precautions in place.

## 2016-04-23 ENCOUNTER — Telehealth: Payer: Self-pay | Admitting: Neurology

## 2016-04-23 ENCOUNTER — Emergency Department
Admission: EM | Admit: 2016-04-23 | Discharge: 2016-04-23 | Disposition: A | Discharge status: Home / Self Care | Payer: Medicaid Other | Attending: Emergency Medicine | Admitting: Emergency Medicine

## 2016-04-23 ENCOUNTER — Encounter: Payer: Self-pay | Admitting: Emergency Medicine

## 2016-04-23 ENCOUNTER — Emergency Department
Admission: EM | Admit: 2016-04-23 | Discharge: 2016-04-23 | Disposition: A | Discharge status: Home / Self Care | Payer: Medicaid Other | Source: Home / Self Care | Attending: Student in an Organized Health Care Education/Training Program | Admitting: Student in an Organized Health Care Education/Training Program

## 2016-04-23 ENCOUNTER — Emergency Department: Payer: Medicaid Other

## 2016-04-23 DIAGNOSIS — Z87891 Personal history of nicotine dependence: Secondary | ICD-10-CM | POA: Insufficient documentation

## 2016-04-23 DIAGNOSIS — R42 Dizziness and giddiness: Secondary | ICD-10-CM | POA: Diagnosis present

## 2016-04-23 DIAGNOSIS — R079 Chest pain, unspecified: Secondary | ICD-10-CM

## 2016-04-23 DIAGNOSIS — R0789 Other chest pain: Secondary | ICD-10-CM | POA: Diagnosis not present

## 2016-04-23 DIAGNOSIS — G40909 Epilepsy, unspecified, not intractable, without status epilepticus: Secondary | ICD-10-CM

## 2016-04-23 LAB — BASIC METABOLIC PANEL
ANION GAP: 5 (ref 5–15)
Anion gap: 3 — ABNORMAL LOW (ref 5–15)
BUN: 12 mg/dL (ref 6–20)
BUN: 13 mg/dL (ref 6–20)
CHLORIDE: 106 mmol/L (ref 101–111)
CO2: 27 mmol/L (ref 22–32)
CO2: 30 mmol/L (ref 22–32)
CREATININE: 1.16 mg/dL (ref 0.61–1.24)
Calcium: 8.8 mg/dL — ABNORMAL LOW (ref 8.9–10.3)
Calcium: 9.3 mg/dL (ref 8.9–10.3)
Chloride: 107 mmol/L (ref 101–111)
Creatinine, Ser: 1.2 mg/dL (ref 0.61–1.24)
GFR calc Af Amer: >60 mL/min (ref >60)
GFR calc Af Amer: >60 mL/min (ref >60)
GLUCOSE: 96 mg/dL (ref 65–99)
GLUCOSE: 92 mg/dL (ref 65–99)
POTASSIUM: 4 mmol/L (ref 3.5–5.1)
POTASSIUM: 4.3 mmol/L (ref 3.5–5.1)
SODIUM: 140 mmol/L (ref 135–145)
Sodium: 138 mmol/L (ref 135–145)

## 2016-04-23 LAB — CBC
HEMATOCRIT: 41.5 % (ref 40.0–52.0)
HEMOGLOBIN: 14.6 g/dL (ref 13.0–18.0)
MCH: 29.7 pg (ref 26.0–34.0)
MCHC: 35.2 g/dL (ref 32.0–36.0)
MCV: 84.3 fL (ref 80.0–100.0)
Platelets: 227 10*3/uL (ref 150–440)
RBC: 4.93 MIL/uL (ref 4.40–5.90)
RDW: 12.2 % (ref 11.5–14.5)
WBC: 4.1 10*3/uL (ref 3.8–10.6)

## 2016-04-23 LAB — VALPROIC ACID LEVEL

## 2016-04-23 LAB — TROPONIN I: Troponin I: <0.03 ng/mL (ref <0.03)

## 2016-04-23 MED ORDER — DIVALPROEX SODIUM 500 MG PO DR TAB
1000.0000 mg | DELAYED_RELEASE_TABLET | Freq: Once | ORAL | Status: AC
Start: 2016-04-23 — End: 2016-04-23
  Administered 2016-04-23: 1000 mg via ORAL
  Filled 2016-04-23: qty 2

## 2016-04-23 MED ORDER — DIVALPROEX SODIUM 500 MG PO DR TAB
1000.0000 mg | DELAYED_RELEASE_TABLET | Freq: Once | ORAL | Status: AC
  Administered 2016-04-23: 1000 mg via ORAL
  Filled 2016-04-23: qty 2

## 2016-04-23 MED ORDER — LEVETIRACETAM 500 MG PO TABS
1000.0000 mg | ORAL_TABLET | Freq: Once | ORAL | Status: AC
  Administered 2016-04-23: 1000 mg via ORAL
  Filled 2016-04-23: qty 2

## 2016-04-23 MED ORDER — SODIUM CHLORIDE 0.9 % IV SOLN
1500.0000 mg | Freq: Once | INTRAVENOUS | Status: AC
  Administered 2016-04-23: 1500 mg via INTRAVENOUS
  Filled 2016-04-23: qty 15

## 2016-04-23 MED ORDER — DIVALPROEX SODIUM 500 MG PO DR TAB: 1250.0000 mg | DELAYED_RELEASE_TABLET | Freq: Once | ORAL | Status: DC

## 2016-04-23 NOTE — ED Notes (Signed)
Pt arrived via EMS.  A&O.  Reports having 2 seizures today.  States he starts to feel his head spinning right before a seizure.  Skin dry and cool, warm blankets placed on pt.  Pt was found by EMS out in the rain. PERRL, MAE, no facial droop.

## 2016-04-23 NOTE — ED Notes (Signed)
Pt given meal tray, tolerating PO without issue

## 2016-04-23 NOTE — ED Notes (Signed)
Pt pressed call bell, this RN walked into room and pt was shaking in bed, pt arousalable to sternal rub. MD made aware and at bedside. No post-ictal effects noted. Pt A&O x4 and asking for food

## 2016-04-23 NOTE — ED Notes (Signed)
Esig pad not working. Pt verbalized consent  

## 2016-04-23 NOTE — ED Notes (Signed)
Pt reports that he is epileptic and that he was seen in the ER earlier today for 2 "grand mal seizures" - He states the seizures are not getting better and he feels light headed and dizzy - Pt reports he has had two seizures since he left the ER - He takes Depakote and Keppra and reports he has been taking as prescribed - Pt reports that he is short of breath and having chest pain - Pt appears in no respiratory distress (respirations are even and unlabored at a rate of 16) - Pt reports chest pain over center of chest that feels like a tightness

## 2016-04-23 NOTE — ED Notes (Signed)
Pt arrived with 2 knives, a cell phone, glasses and ID.  All placed in belonging bag and given to pt.

## 2016-04-23 NOTE — ED Triage Notes (Addendum)
Pt presents ED via EMS with c/o dizziness. Pt reports he had seizure x2 this morning. Pt was seen here today for same. Pt requesting to be admitted, states due to his seizure not resolving. Pt alerts and oriented x4 at this time.

## 2016-04-23 NOTE — Telephone Encounter (Signed)
Spoke to family member to have him call and make appt with NP this week.  He relayed will have him call back.

## 2016-04-23 NOTE — Telephone Encounter (Signed)
Spoke to Dr. Roxan Hockeyobinson at the emergency room. Patient has been seen in the emergency room with breakthrough seizures, either in the context of noncompliance with medication or erratic medication metabolism. Depakote level today was undetectable, patient apparently claims that he is taking his medication. In the recent past his medication levels have been erratic as well. Patient has an appointment with Dr. Terrace ArabiaYan next month, nevertheless, he has had multiple emergency room visits for breakthrough seizures. He was given 1 g of Keppra was also loaded with Depacon.  Sandy, please call patient for a visit sooner than scheduled. Please see if he can be seen by Dr. Terrace ArabiaYan sooner than scheduled in the next week or 2 or nurse practitioner this week or at the most next week.

## 2016-04-23 NOTE — ED Triage Notes (Signed)
Reports hx of seizures.  States he had 2 unwitnessed today.  EMS states pt became alert with amonia capsules.  On arrival to ED pt is alert.

## 2016-04-23 NOTE — ED Provider Notes (Signed)
[facility] Emergency Department Provider Note  Time seen: 9:20 PM  I have reviewed the triage vital signs and the nursing notes.   HISTORY  Chief Complaint Dizziness    HPI Keith Bottomrnest C Westby Jr. is a 35 y.o. male with a past medical history of epilepsy presents the emergency department for chest discomfort. According to the patient he had 2 seizures this morning, came to the emergency department for evaluation. Patient minutes that he has been out of his medication for the past 3 days. His medication is ready for him at the pharmacy but he did not get out of the emergency department in time to get to his medications, but states she will pick him up tomorrow morning. Patient states since going home he has felt dizzy and has been experiencing chest pain.Denies any further seizures since discharge. Patient has a follow-up scheduled with [place] neurology, however when I mention this the patient states he is not going back to see them. Patient states mild chest pain currently. States he continues to feel dizzy.  Past Medical History:  Diagnosis Date  . Learning disorder   . Seasonal allergies   . Seizures [facility](HCC)     Patient Active Problem List   Diagnosis Date Noted  . Seizure (HCC) 04/18/2016  . Epilepsy (HCC) 10/17/2015    Past Surgical History:  Procedure Laterality Date  . HAND SURGERY     right hand surgery  . VASECTOMY Bilateral     Prior to Admission medications   Medication Sig Start Date End Date Taking? Authorizing Provider  divalproex (DEPAKOTE) 500 MG DR tablet Please take 1000 mg in a.m. And 1250 mg at bedtime. Patient taking differently: Take 1,000 mg by mouth 2 (two) times daily. Please take 1000 mg in a.m. And 1250 mg at bedtime. 04/19/16   Houston SirenVivek J Sainani, MD  levETIRAcetam (KEPPRA) 500 MG tablet Take 1,000 mg by mouth 2 (two) times daily.     Historical Provider, MD    Allergies  Allergen Reactions  . Pollen Extract Other (See  Comments)    Runny nose, watery eyes, sneezing    Family History  Problem Relation Age of Onset  . Cancer Father     Unsure of type  . Hypertension Father   . Hypertension Mother   . Diabetes Mellitus II Maternal Grandmother     Social History Social History  Substance Use Topics  . Smoking status: Former Smoker    Packs/day: 0.30    Types: Cigarettes    Quit date: 07/18/2014  . Smokeless tobacco: Never Used     Comment: stopped 4 months ago  . Alcohol use No     Comment: socially    Review of Systems Constitutional: Negative for fever. Cardiovascular: Positive for intermittent chest discomfort. Respiratory: Negative for shortness of breath. Gastrointestinal: Negative for abdominal pain Musculoskeletal: Negative for back pain. Neurological: Negative for headaches, focal weakness or numbness. 10-point ROS otherwise negative.  ____________________________________________   PHYSICAL EXAM:  VITAL SIGNS: ED Triage Vitals  Enc Vitals Group     BP 04/23/16 1908 127/79     Pulse Rate 04/23/16 1908 (!) 59     Resp 04/23/16 1908 18     Temp 04/23/16 1908 97.7 F (36.5 C)     Temp Source 04/23/16 1908 Oral     SpO2 04/23/16 1908 99 %     Weight 04/23/16 1908 165 lb (74.8 kg)     Height 04/23/16 1908 6\' 2"  (1.88 m)  Head Circumference --      Peak Flow --      Pain Score 04/23/16 1923 0     Pain Loc --      Pain Edu? --      Excl. in GC? --     Constitutional: Alert and oriented. Well appearing and in no distress. Eyes: Normal exam ENT   Head: Normocephalic and atraumatic.   Mouth/Throat: Mucous membranes are moist. Cardiovascular: Normal rate, regular rhythm. No murmur Respiratory: Normal respiratory effort without tachypnea nor retractions. Breath sounds are clear  Gastrointestinal: Soft and nontender. No distention.  Musculoskeletal: Nontender with normal range of motion in all extremities.  Neurologic:  Normal speech and language. No gross focal  neurologic deficits Psychiatric: Mood and affect are normal. Speech and behavior are normal.   ____________________________________________    EKG  EKG reviewed and interpreted by myself shows sinus bradycardia at 55 bpm, narrow QRS, right axis deviation, nonspecific ST changes.   ____________________________________________    RADIOLOGY  Chest x-ray negative  ____________________________________________   INITIAL IMPRESSION / ASSESSMENT AND PLAN / ED COURSE  Pertinent labs & imaging results that were available during my care of the patient were reviewed by me and considered in my medical decision making (see chart for details).  Patient presents the emergency department with continued dizziness and intermittent chest discomfort since this morning when he suffered 2 seizures. Patient was seen in the emergency department for the seizures. Patient was loaded with Keppra and Depakote, he has not had any further seizures. Patient has follow-up with neurology, but he states he is not having go back to see them. Patient states mild chest discomfort currently. We will check labs including troponin. We'll dose the patient's nightly seizure medications, and closely monitor in the emergency department while awaiting results.  Labs are within normal limits. Troponin is negative. EKG is reassuring. Chest x-ray is negative. We will discharge the patient home. He states he will pick up his prescriptions tomorrow morning to restart his seizure medications. Patient has received his nighttime dose of medications in the emergency department. I discussed with the patient increasing his fluids, and obtaining plenty of rest.  ____________________________________________   FINAL CLINICAL IMPRESSION(S) / ED DIAGNOSES  Dizziness Chest pain    Minna Antis, MD 04/23/16 2206

## 2016-04-23 NOTE — ED Provider Notes (Signed)
[facility] Emergency Department Provider Note    First MD Initiated Contact with Patient 04/23/16 1048     (approximate)  I have reviewed the triage vital signs and the nursing notes.   HISTORY  Chief Complaint Seizures    HPI Keith Chung. is a 35 y.o. male with a history of seizure disorder on Depakote and Keppra presents with 2 episodes of seizure-like activity that occurred today the patient does not remember exactly what happened. States that he is at his cousin's house and started seeing black spots and feeling lightheaded and then awoke up on the ground. Uncertain as to how long he was out. He called 911 and then had another episode. This was unwitnessed. He did not bite his tongue. Did not lose control his bladder. Patient states that he's been taking his Depakote and Keppra as directed. Denies any fevers denies any numbness or tingling. States he does have a mild headache and feels tired at this time. Denies any substance abuse. Denies any chest pain or shortness of breath.   Past Medical History:  Diagnosis Date  . Learning disorder   . Seasonal allergies   . Seizures [facility])     Patient Active Problem List   Diagnosis Date Noted  . Seizure (HCC) 04/18/2016  . Epilepsy (HCC) 10/17/2015    Past Surgical History:  Procedure Laterality Date  . HAND SURGERY     right hand surgery  . VASECTOMY Bilateral     Prior to Admission medications   Medication Sig Start Date End Date Taking? Authorizing Provider  divalproex (DEPAKOTE) 500 MG DR tablet Please take 1000 mg in a.m. And 1250 mg at bedtime. Patient taking differently: Take 1,000 mg by mouth 2 (two) times daily. Please take 1000 mg in a.m. And 1250 mg at bedtime. 04/19/16   Houston Siren, MD  levETIRAcetam (KEPPRA) 500 MG tablet Take 1,000 mg by mouth 2 (two) times daily.     Historical Provider, MD    Allergies Pollen extract  Family History  Problem Relation Age of Onset  .  Cancer Father     Unsure of type  . Hypertension Father   . Hypertension Mother   . Diabetes Mellitus II Maternal Grandmother     Social History Social History  Substance Use Topics  . Smoking status: Former Smoker    Packs/day: 0.30    Types: Cigarettes    Quit date: 07/18/2014  . Smokeless tobacco: Never Used     Comment: stopped 4 months ago  . Alcohol use No     Comment: socially    Review of Systems Patient denies headaches, rhinorrhea, blurry vision, numbness, shortness of breath, chest pain, edema, cough, abdominal pain, nausea, vomiting, diarrhea, dysuria, fevers, rashes or hallucinations unless otherwise stated above in HPI. ____________________________________________   PHYSICAL EXAM:  VITAL SIGNS: Vitals:   04/23/16 1330 04/23/16 1400  BP: (!) 139/103 (!) 137/107  Pulse: (!) 59   Resp: 17 15  Temp:      Constitutional: Alert and oriented. Well appearing and in no acute distress. Eyes: Conjunctivae are normal. PERRL. EOMI. Head: Atraumatic. Nose: No congestion/rhinnorhea. Mouth/Throat: Mucous membranes are moist.  Oropharynx non-erythematous. Neck: No stridor. Painless ROM. No cervical spine tenderness to palpation Hematological/Lymphatic/Immunilogical: No cervical lymphadenopathy. Cardiovascular: Normal rate, regular rhythm. Grossly normal heart sounds.  Good peripheral circulation. Respiratory: Normal respiratory effort.  No retractions. Lungs CTAB. Gastrointestinal: Soft and nontender. No distention. No abdominal bruits. No CVA tenderness. Genitourinary:  Musculoskeletal: No lower extremity tenderness nor edema.  No joint effusions. Neurologic:  Normal speech and language. CN 2-12 intact No gross focal neurologic deficits are appreciated. No gait instability. Skin:  Skin is warm, dry and intact. No rash noted. Psychiatric: Mood and affect are normal. Speech and behavior are normal.  ____________________________________________   LABS (all labs  ordered are listed, but only abnormal results are displayed)  Results for orders placed or performed during the hospital encounter of 04/23/16 (from the past 24 hour(s))  Valproic acid level     Status: Abnormal   Collection Time: 04/23/16 10:59 AM  Result Value Ref Range   Valproic Acid Lvl <10 (L) 50.0 - 100.0 ug/mL  Basic metabolic panel     Status: Abnormal   Collection Time: 04/23/16 10:59 AM  Result Value Ref Range   Sodium 138 135 - 145 mmol/L   Potassium 4.0 3.5 - 5.1 mmol/L   Chloride 106 101 - 111 mmol/L   CO2 27 22 - 32 mmol/L   Glucose, Bld 96 65 - 99 mg/dL   BUN 12 6 - 20 mg/dL   Creatinine, Ser 1.611.20 0.61 - 1.24 mg/dL   Calcium 8.8 (L) 8.9 - 10.3 mg/dL   GFR calc non Af Amer >60 >60 mL/min   GFR calc Af Amer >60 >60 mL/min   Anion gap 5 5 - 15   ____________________________________________  EKG  Time: 11:03  Indication: seizure  Rate: 65  Rhythm: nsr Axis: right Other: J-point elevation in V2 and V3 no evidence of acute ST elevations or depressions. ____________________________________________  RADIOLOGY   ____________________________________________   PROCEDURES  Procedure(s) performed: none    Critical Care performed: no ____________________________________________   INITIAL IMPRESSION / ASSESSMENT AND PLAN / ED COURSE  Pertinent labs & imaging results that were available during my care of the patient were reviewed by me and considered in my medical decision making (see chart for details).  DDX: Seizure, electrolyte Abnormality, dysrhythmia, medication noncompliance, status epilepticus  Keith Bottomrnest C Morlock Jr. is a 35 y.o. who presents to the ED with seizure episode. Patient afebrile and hemodynamically stable. No focal neuro deficits at this time. Patient with well-documented seizure history. Will check labs and Depakote level will provide IV Keppra.  Clinical Course  Comment By Time  [facility] who agreed to see  patient this week for follow-up. Depakote level undetectable. Suggestive of medication non-compliance.  Spoke with patient regarding this finding and patient states that he is not since gotten his prescription filled since discharge.  States he has funds but hasn't "had time for transportation" the to pharmacy. Patient provided Depakote in the ED. Neuro exam remains nonfocal.  As the patient is medically stable with no acute pathology, able to tolerate by mouth and able to ambulate with a steady gait, feel patient stable for discharge home. Have discussed with the patient and available family all diagnostics and treatments performed thus far and all questions were answered to the best of my ability. The patient demonstrates understanding and agreement with plan.  Willy EddyPatrick Giancarlos Berendt, MD 08/07 1414     ____________________________________________   FINAL CLINICAL IMPRESSION(S) / ED DIAGNOSES  Final diagnoses:  Seizure disorder [facility](HCC)      NEW MEDICATIONS STARTED DURING THIS VISIT:  Discharge Medication List as of 04/23/2016  2:11 PM       Note:  This document was prepared using Dragon voice recognition software and may include unintentional dictation errors.     Luisa HartPatrick  Roxan Hockey, MD 04/23/16 1610

## 2016-04-23 NOTE — ED Notes (Signed)
Seizure pads in place

## 2016-04-23 NOTE — Discharge Instructions (Signed)
Please call Dr. Zannie CoveYan's office regarding a follow-up appointment this week. Continue taking her Depakote and Keppra as directed. Please let us know if you have any difficulties with obtaining these medications. Return to ER for any additional concerns.

## 2016-04-24 ENCOUNTER — Telehealth: Payer: Self-pay | Admitting: Neurology

## 2016-04-24 NOTE — Telephone Encounter (Addendum)
Patient called to request referral and records to [facility] [place], states it's difficult for him to get transportation to our office. [facility] will see him but they need a referral from our office along with records. Patient currently has appointment with Dr. Terrace ArabiaYan September 13th. If we are able to take care of this referral and records request prior to this appointment, then will cancel appointment. Please call [phone] or [phone].

## 2016-04-24 NOTE — Telephone Encounter (Signed)
I called and spoke to his mother, and he was told to call us back.   He has transportation issues and it is hard to come to [place].  [facility].  I will wait for his call to make appt for him this or next week.   Noted he has appt with Dr. Terrace ArabiaYan in septmeber.

## 2016-04-24 NOTE — Telephone Encounter (Signed)
Spoke to patient - states he does not need a referral but just needs his records sent to [facility] in [place] (Neurology Dept) - ph: [phone], fax:  [phone].  He has [place] Medicaid now and will provide the card to [facility] directly.

## 2016-04-26 NOTE — Telephone Encounter (Signed)
Pt medical records faxed to  [facility]/ [place] to fax [phone] # [phone].

## 2016-05-01 DIAGNOSIS — Z0289 Encounter for other administrative examinations: Secondary | ICD-10-CM

## 2016-05-04 ENCOUNTER — Telehealth: Payer: Self-pay | Admitting: Neurology

## 2016-05-04 NOTE — Telephone Encounter (Signed)
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 Please call to discuss.

## 2016-05-07 NOTE — Telephone Encounter (Signed)
Spoke to patient - reports having continued seizure activity, despite taking medications as prescribed (confirmed doses in his med list).  His last seizure was last week - states he had two events in one day.  He cannot get into see [facility] until December (transferring care due to transportation issues - closer to home).  He would like to see Dr. Terrace ArabiaYan again, while waiting for his appt, to discuss his medications and work restrictions.  Offered several appts - he decided on 05/24/16.

## 2016-05-07 NOTE — Telephone Encounter (Signed)
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 Please call pt at [phone]

## 2016-05-07 NOTE — Telephone Encounter (Signed)
Pt returned MIchelle's call

## 2016-05-07 NOTE — Telephone Encounter (Signed)
Left message for a return call

## 2016-05-07 NOTE — Telephone Encounter (Signed)
Patient reports having another seizure one week ago.

## 2016-05-08 ENCOUNTER — Other Ambulatory Visit: Payer: Self-pay | Admitting: *Deleted

## 2016-05-08 MED ORDER — DIVALPROEX SODIUM 500 MG PO DR TAB: DELAYED_RELEASE_TABLET | ORAL | 1 refills | Status: DC

## 2016-05-08 MED ORDER — LEVETIRACETAM 500 MG PO TABS: ORAL_TABLET | ORAL | 1 refills | Status: DC

## 2016-05-08 NOTE — Telephone Encounter (Signed)
Per Dr. Terrace ArabiaYan, patient should increase his medications, as follows:  1) He was taking Depakote 1000mg  in am and 1250mg  in pm.  He should now take 1000mg  in am and 1500mg  in pm.  2) He was taking Keppra 1000mg , BID.  He should now take Keppra 1500mg  BID.  New prescriptions have been sent to the pharmacy. I have spoken with the patient and reviewed the new doses.  He verbalized understanding and repeated the new instructions back to me.

## 2016-05-09 ENCOUNTER — Inpatient Hospital Stay
Admission: EM | Admit: 2016-05-09 | Discharge: 2016-05-13 | DRG: 101 | Disposition: A | Discharge status: Home / Self Care | Payer: Medicaid Other | Attending: Specialist | Admitting: Specialist

## 2016-05-09 ENCOUNTER — Encounter: Payer: Self-pay | Admitting: Emergency Medicine

## 2016-05-09 DIAGNOSIS — G40909 Epilepsy, unspecified, not intractable, without status epilepticus: Secondary | ICD-10-CM | POA: Diagnosis not present

## 2016-05-09 DIAGNOSIS — R569 Unspecified convulsions: Secondary | ICD-10-CM | POA: Diagnosis not present

## 2016-05-09 DIAGNOSIS — Z9889 Other specified postprocedural states: Secondary | ICD-10-CM | POA: Diagnosis not present

## 2016-05-09 DIAGNOSIS — F8189 Other developmental disorders of scholastic skills: Secondary | ICD-10-CM | POA: Diagnosis not present

## 2016-05-09 DIAGNOSIS — Z809 Family history of malignant neoplasm, unspecified: Secondary | ICD-10-CM

## 2016-05-09 DIAGNOSIS — Z8249 Family history of ischemic heart disease and other diseases of the circulatory system: Secondary | ICD-10-CM | POA: Diagnosis not present

## 2016-05-09 DIAGNOSIS — T426X5A Adverse effect of other antiepileptic and sedative-hypnotic drugs, initial encounter: Secondary | ICD-10-CM | POA: Diagnosis present

## 2016-05-09 DIAGNOSIS — F819 Developmental disorder of scholastic skills, unspecified: Secondary | ICD-10-CM | POA: Diagnosis present

## 2016-05-09 DIAGNOSIS — Z87891 Personal history of nicotine dependence: Secondary | ICD-10-CM

## 2016-05-09 DIAGNOSIS — Z833 Family history of diabetes mellitus: Secondary | ICD-10-CM

## 2016-05-09 DIAGNOSIS — K029 Dental caries, unspecified: Secondary | ICD-10-CM | POA: Diagnosis present

## 2016-05-09 DIAGNOSIS — T426X1A Poisoning by other antiepileptic and sedative-hypnotic drugs, accidental (unintentional), initial encounter: Secondary | ICD-10-CM | POA: Diagnosis present

## 2016-05-09 LAB — CBC WITH DIFFERENTIAL/PLATELET
BASOS ABS: 0 10*3/uL (ref 0–0.1)
BASOS PCT: 1 %
Eosinophils Absolute: 0.2 10*3/uL (ref 0–0.7)
Eosinophils Relative: 6 %
HEMATOCRIT: 39.7 % — AB (ref 40.0–52.0)
HEMOGLOBIN: 14 g/dL (ref 13.0–18.0)
LYMPHS PCT: 34 %
Lymphs Abs: 1.2 10*3/uL (ref 1.0–3.6)
MCH: 30.1 pg (ref 26.0–34.0)
MCHC: 35.2 g/dL (ref 32.0–36.0)
MCV: 85.5 fL (ref 80.0–100.0)
Monocytes Absolute: 0.3 10*3/uL (ref 0.2–1.0)
Monocytes Relative: 10 %
NEUTROS ABS: 1.7 10*3/uL (ref 1.4–6.5)
NEUTROS PCT: 49 %
Platelets: 200 10*3/uL (ref 150–440)
RBC: 4.64 MIL/uL (ref 4.40–5.90)
RDW: 12.1 % (ref 11.5–14.5)
WBC: 3.4 10*3/uL — ABNORMAL LOW (ref 3.8–10.6)

## 2016-05-09 LAB — COMPREHENSIVE METABOLIC PANEL
ALBUMIN: 4 g/dL (ref 3.5–5.0)
ALK PHOS: 38 U/L (ref 38–126)
ALT: 10 U/L — AB (ref 17–63)
AST: 18 U/L (ref 15–41)
Anion gap: 7 (ref 5–15)
BILIRUBIN TOTAL: 0.3 mg/dL (ref 0.3–1.2)
BUN: 17 mg/dL (ref 6–20)
CALCIUM: 8.9 mg/dL (ref 8.9–10.3)
CO2: 26 mmol/L (ref 22–32)
CREATININE: 1.22 mg/dL (ref 0.61–1.24)
Chloride: 104 mmol/L (ref 101–111)
GFR calc Af Amer: >60 mL/min (ref >60)
GLUCOSE: 86 mg/dL (ref 65–99)
POTASSIUM: 3.9 mmol/L (ref 3.5–5.1)
Sodium: 137 mmol/L (ref 135–145)
TOTAL PROTEIN: 6.9 g/dL (ref 6.5–8.1)

## 2016-05-09 LAB — VALPROIC ACID LEVEL: VALPROIC ACID LVL: 196 ug/mL — AB (ref 50.0–100.0)

## 2016-05-09 MED ORDER — LEVETIRACETAM 500 MG PO TABS
500.0000 mg | ORAL_TABLET | Freq: Two times a day (BID) | ORAL | Status: DC
  Administered 2016-05-09 – 2016-05-13 (×7): 500 mg via ORAL
  Filled 2016-05-09 (×8): qty 1

## 2016-05-09 MED ORDER — AMOXICILLIN 500 MG PO CAPS
ORAL_CAPSULE | ORAL | Status: AC
  Administered 2016-05-09: 500 mg via ORAL
  Filled 2016-05-09: qty 1

## 2016-05-09 MED ORDER — SODIUM CHLORIDE 0.9 % IV SOLN
Freq: Once | INTRAVENOUS | Status: AC
  Administered 2016-05-09: 16:00:00 via INTRAVENOUS

## 2016-05-09 MED ORDER — ONDANSETRON HCL 4 MG PO TABS: 4.0000 mg | ORAL_TABLET | Freq: Four times a day (QID) | ORAL | Status: DC | PRN

## 2016-05-09 MED ORDER — ONDANSETRON HCL 4 MG/2ML IJ SOLN
4.0000 mg | Freq: Four times a day (QID) | INTRAMUSCULAR | Status: DC | PRN
Start: 2016-05-09 — End: 2016-05-13
  Administered 2016-05-11 (×2): 4 mg via INTRAVENOUS
  Filled 2016-05-09 (×2): qty 2

## 2016-05-09 MED ORDER — LAMOTRIGINE 25 MG PO TABS
25.0000 mg | ORAL_TABLET | Freq: Every day | ORAL | Status: DC
  Administered 2016-05-09 – 2016-05-10 (×2): 25 mg via ORAL
  Filled 2016-05-09 (×3): qty 1

## 2016-05-09 MED ORDER — ENOXAPARIN SODIUM 40 MG/0.4ML ~~LOC~~ SOLN
40.0000 mg | SUBCUTANEOUS | Status: DC
  Administered 2016-05-09 – 2016-05-12 (×4): 40 mg via SUBCUTANEOUS
  Filled 2016-05-09 (×4): qty 0.4

## 2016-05-09 MED ORDER — ACETAMINOPHEN 650 MG RE SUPP: 650.0000 mg | Freq: Four times a day (QID) | RECTAL | Status: DC | PRN

## 2016-05-09 MED ORDER — ACETAMINOPHEN 325 MG PO TABS
650.0000 mg | ORAL_TABLET | Freq: Four times a day (QID) | ORAL | Status: DC | PRN
  Administered 2016-05-09 – 2016-05-10 (×2): 650 mg via ORAL
  Filled 2016-05-09 (×2): qty 2

## 2016-05-09 MED ORDER — SODIUM CHLORIDE 0.9 % IV SOLN
INTRAVENOUS | Status: DC
  Administered 2016-05-09 – 2016-05-12 (×7): via INTRAVENOUS

## 2016-05-09 MED ORDER — AMOXICILLIN 500 MG PO CAPS
500.0000 mg | ORAL_CAPSULE | Freq: Three times a day (TID) | ORAL | Status: DC
  Administered 2016-05-09 – 2016-05-13 (×12): 500 mg via ORAL
  Filled 2016-05-09 (×12): qty 1

## 2016-05-09 NOTE — ED Notes (Signed)
Dr Mayford Knifewilliams notified valproic acid level 196

## 2016-05-09 NOTE — ED Provider Notes (Addendum)
[facility] Emergency Department Provider Note        Time seen: ----------------------------------------- 1:30 PM on 05/09/2016 -----------------------------------------    I have reviewed the triage vital signs and the nursing notes.   HISTORY  Chief Complaint Seizures    HPI Keith Bottomrnest C Morrish Jr. is a 35 y.o. male who presents to the ER after having had several seizures today. Patient reportedly was supposed to take Lamictal last night but his pharmacy was not open. Reportedly he is taking Keppra and Depakote and his pharmacy was closed and he could not get Lamictal last night. Patient states he had been seeing white spots since yesterday.    Past Medical History:  Diagnosis Date  . Learning disorder   . Seasonal allergies   . Seizures [facility](HCC)     Patient Active Problem List   Diagnosis Date Noted  . Seizure (HCC) 04/18/2016  . Epilepsy (HCC) 10/17/2015    Past Surgical History:  Procedure Laterality Date  . HAND SURGERY     right hand surgery  . VASECTOMY Bilateral     Allergies Pollen extract  Social History Social History  Substance Use Topics  . Smoking status: Former Smoker    Packs/day: 0.30    Types: Cigarettes    Quit date: 07/18/2014  . Smokeless tobacco: Never Used     Comment: stopped 4 months ago  . Alcohol use No     Comment: socially    Review of Systems Constitutional: Negative for fever. Cardiovascular: Negative for chest pain. Respiratory: Negative for shortness of breath. Gastrointestinal: Negative for abdominal pain, vomiting and diarrhea. Genitourinary: Negative for dysuria. Musculoskeletal: Negative for back pain. Skin: Negative for rash. Neurological: Negative for headaches, focal weakness or numbness.Positive for possible seizure-like event   10-point ROS otherwise negative.  ____________________________________________   PHYSICAL EXAM:  VITAL SIGNS: ED Triage Vitals  Enc Vitals Group     BP  05/09/16 1315 139/78     Pulse Rate 05/09/16 1314 72     Resp 05/09/16 1314 16     Temp 05/09/16 1314 98.3 F (36.8 C)     Temp Source 05/09/16 1314 Oral     SpO2 05/09/16 1315 97 %     Weight 05/09/16 1314 167 lb (75.8 kg)     Height 05/09/16 1314 6\' 2"  (1.88 m)     Head Circumference --      Peak Flow --      Pain Score 05/09/16 1314 9     Pain Loc --      Pain Edu? --      Excl. in GC? --     Constitutional: Alert and oriented. Well appearing and in no distress. Eyes: Conjunctivae are normal. PERRL. Normal extraocular movements. ENT   Head: Normocephalic and atraumatic.   Nose: No congestion/rhinnorhea.   Mouth/Throat: Mucous membranes are moist.   Neck: No stridor. Cardiovascular: Normal rate, regular rhythm. No murmurs, rubs, or gallops. Respiratory: Normal respiratory effort without tachypnea nor retractions. Breath sounds are clear and equal bilaterally. No wheezes/rales/rhonchi. Gastrointestinal: Soft and nontender. Normal bowel sounds Musculoskeletal: Nontender with normal range of motion in all extremities. No lower extremity tenderness nor edema. Neurologic:  Normal speech and language. No gross focal neurologic deficits are appreciated.  Skin:  Skin is warm, dry and intact. No rash noted. Psychiatric: Mood and affect are normal. Speech and behavior are normal.  ____________________________________________  ED COURSE:  Pertinent labs & imaging results that were available during my care of  the patient were reviewed by me and considered in my medical decision making (see chart for details). Clinical Course   patient is in no acute distress, I will check a Depakote level and likely recommend he start his Lamictal as prescribed   Procedures   EKG: Interpreted by me, normal sinus rhythm with a rate of 74 bpm, normal PR interval, normal QRS, normal QT interval. ____________________________________________   LABS (pertinent positives/negatives)  Labs  Reviewed  VALPROIC ACID LEVEL - Abnormal; Notable for the following:       Result Value   Valproic Acid Lvl 196 (*)    All other components within normal limits  CBC WITH DIFFERENTIAL/PLATELET - Abnormal; Notable for the following:    WBC 3.4 (*)    HCT 39.7 (*)    All other components within normal limits  COMPREHENSIVE METABOLIC PANEL - Abnormal; Notable for the following:    ALT 10 (*)    All other components within normal limits  ____________________________________________  FINAL ASSESSMENT AND PLAN   Possible seizure, Depakote toxicity  Plan: Patient with labs as dictated above. Patient had presented previously with subtherapeutic Depakote levels, now with Depakote toxicity. I discussed with neurology on call who recommends admission. He has received saline infusion.   Emily FilbertWilliams, Suren Payne E, MD   Note: This dictation was prepared with Dragon dictation. Any transcriptional errors that result from this process are unintentional    Emily FilbertJonathan E Maesyn Frisinger, MD 05/09/16 1544    Emily FilbertJonathan E Danna Sewell, MD 05/09/16 [phone]

## 2016-05-09 NOTE — H&P (Signed)
[facility] [place] at [facility]   PATIENT NAME: Keith Chung    MR#:  [id number]  DATE OF BIRTH:  May 20, 1981  DATE OF ADMISSION:  05/09/2016  PRIMARY CARE PHYSICIAN: Pcp Not In System   REQUESTING/REFERRING PHYSICIAN: Dr Mayford KnifeWilliams  CHIEF COMPLAINT:  I'm not feeling well I think I'm having seizures.  HISTORY OF PRESENT ILLNESS:  Keith Chung  is a 35 y.o. male with a known history of Seizure disorder on antiepileptics followed by Dr. Malvin JohnsPotter comes to the emergency room after he reports having several seizures today. Patient was supposed to be started on omeprazole gradually increasing dose which was prescribed to Dr. Malvin JohnsPotter yesterday however he could not go to the pharmacy. He came to the emergency room he reportedly has being seeing some white spots in his eyes, feeling very sleepy and drowsy and he thinks he is having seizures. His Depakote level was 196 He is being admitted for Depakote toxicity.  PAST MEDICAL HISTORY:   Past Medical History:  Diagnosis Date  . Learning disorder   . Seasonal allergies   . Seizures (HCC)     PAST SURGICAL HISTOIRY:   Past Surgical History:  Procedure Laterality Date  . HAND SURGERY     right hand surgery  . VASECTOMY Bilateral     SOCIAL HISTORY:   Social History  Substance Use Topics  . Smoking status: Former Smoker    Packs/day: 0.30    Types: Cigarettes    Quit date: 07/18/2014  . Smokeless tobacco: Never Used     Comment: stopped 4 months ago  . Alcohol use No     Comment: socially    FAMILY HISTORY:   Family History  Problem Relation Age of Onset  . Cancer Father     Unsure of type  . Hypertension Father   . Hypertension Mother   . Diabetes Mellitus II Maternal Grandmother     DRUG ALLERGIES:   Allergies  Allergen Reactions  . Pollen Extract Other (See Comments)    Runny nose, watery eyes, sneezing    REVIEW OF SYSTEMS:  Review of Systems  Constitutional: Positive for  malaise/fatigue. Negative for chills, fever and weight loss.  HENT: Negative for ear discharge, ear pain and nosebleeds.   Eyes: Negative for blurred vision, pain and discharge.  Respiratory: Negative for sputum production, shortness of breath, wheezing and stridor.   Cardiovascular: Negative for chest pain, palpitations, orthopnea and PND.  Gastrointestinal: Negative for abdominal pain, diarrhea, nausea and vomiting.  Genitourinary: Negative for frequency and urgency.  Musculoskeletal: Negative for back pain and joint pain.  Neurological: Positive for seizures and weakness. Negative for sensory change, speech change and focal weakness.  Psychiatric/Behavioral: Negative for depression and hallucinations. The patient is not nervous/anxious.   All other systems reviewed and are negative.    MEDICATIONS AT HOME:   Prior to Admission medications   Medication Sig Start Date End Date Taking? Authorizing Provider  divalproex (DEPAKOTE) 500 MG DR tablet Please take 1000 mg in am and 1500mg  at bedtime. 05/08/16   Levert FeinsteinYijun Yan, MD  levETIRAcetam (KEPPRA) 500 MG tablet Take 1500 mg in am and 1500 mg in pm. 05/08/16   Levert FeinsteinYijun Yan, MD      VITAL SIGNS:  Blood pressure 119/83, pulse 66, temperature 98.3 F (36.8 C), temperature source Oral, resp. rate 19, height 6\' 2"  (1.88 m), weight 167 lb (75.8 kg), SpO2 95 %.  PHYSICAL EXAMINATION:  GENERAL:  35 y.o.-year-old patient lying in  the bed with no acute distress.  EYES: Pupils equal, round, reactive to light and accommodation. No scleral icterus. Extraocular muscles intact.  HEENT: Head atraumatic, normocephalic. Oropharynx and nasopharynx clear.  NECK:  Supple, no jugular venous distention. No thyroid enlargement, no tenderness.  LUNGS: Normal breath sounds bilaterally, no wheezing, rales,rhonchi or crepitation. No use of accessory muscles of respiration.  CARDIOVASCULAR: S1, S2 normal. No murmurs, rubs, or gallops.  ABDOMEN: Soft, nontender,  nondistended. Bowel sounds present. No organomegaly or mass.  EXTREMITIES: No pedal edema, cyanosis, or clubbing.  NEUROLOGIC: Cranial nerves II through XII are intact. Muscle strength 5/5 in all extremities. Sensation intact. Gait not checked.  PSYCHIATRIC: The patient is alert and oriented x 3.  SKIN: No obvious rash, lesion, or ulcer.   LABORATORY PANEL:   CBC  Recent Labs Lab 05/09/16 1326  WBC 3.4*  HGB 14.0  HCT 39.7*  PLT 200   ------------------------------------------------------------------------------------------------------------------  Chemistries  No results for input(s): NA, K, CL, CO2, GLUCOSE, BUN, CREATININE, CALCIUM, MG, AST, ALT, ALKPHOS, BILITOT in the last 168 hours.  Invalid input(s): GFRCGP ------------------------------------------------------------------------------------------------------------------  Cardiac Enzymes No results for input(s): TROPONINI in the last 168 hours. ------------------------------------------------------------------------------------------------------------------  RADIOLOGY:  No results found.  EKG:    IMPRESSION AND PLAN:    Keith Chung  is a 35 y.o. male with a known history of Seizure disorder on antiepileptics followed by Dr. Malvin JohnsPotter comes to the emergency room after he reports having several seizures today. Patient was supposed to be started on omeprazole gradually increasing dose which was prescribed to Dr. Malvin JohnsPotter yesterday however he could not go to the pharmacy.  1. Depakote toxicity Patient came in with level of 196 -We'll hold off oral Depakote -IV fluids -Follow levels -Patient apparently is being tapered off with Depakote as outpatient and labetalol has been added by his neurologist Dr. Georgianne FickPotter -We'll have neurology consultation with Dr. Thad Rangereynolds  2. Seizure disorder -Patient currently is on Depakote which according to him is to be tapered off once his Lamictal is increased gradually -He also takes  Keppra 500 twice a day which I'll continue -We'll discuss with Dr. Thad Rangereynolds patient's seizure meds -Seizure precautions while patient is in house  3. Dental caries -Patient has been prescribed amoxicillin 500 mg 3 times a day from his dentist which I'll start  4. DVT prophylaxis subcutaneous Lovenox   All the records are reviewed and case discussed with ED provider. Management plans discussed with the patient, family and they are in agreement.  CODE STATUS: Full  TOTAL TIME TAKING CARE OF THIS PATIENT: 45 minutes.    Dalonda Simoni M.D on 05/09/2016 at 3:59 PM  Between 7am to 6pm - Pager - [phone]  After 6pm go to [email or web address] - password [email or web address]  [facility]  Office  [phone]  CC: Primary care physician; Pcp Not In System

## 2016-05-09 NOTE — ED Triage Notes (Signed)
Pt has been seen multiple times for seizures. He reports a seizure this AM. Has had medication changes recently. Is supposed to start lamictal but has not yet. Has been seeing white spots since yesterday

## 2016-05-09 NOTE — ED Notes (Signed)
MD at bedside. 

## 2016-05-10 DIAGNOSIS — T426X1A Poisoning by other antiepileptic and sedative-hypnotic drugs, accidental (unintentional), initial encounter: Secondary | ICD-10-CM

## 2016-05-10 DIAGNOSIS — R569 Unspecified convulsions: Secondary | ICD-10-CM

## 2016-05-10 LAB — VALPROIC ACID LEVEL: Valproic Acid Lvl: 203 ug/mL (ref 50.0–100.0)

## 2016-05-10 MED ORDER — KETOROLAC TROMETHAMINE 30 MG/ML IJ SOLN
30.0000 mg | Freq: Once | INTRAMUSCULAR | Status: AC
  Administered 2016-05-10: 30 mg via INTRAVENOUS
  Filled 2016-05-10: qty 1

## 2016-05-10 MED ORDER — DOCUSATE SODIUM 100 MG PO CAPS
100.0000 mg | ORAL_CAPSULE | Freq: Every day | ORAL | Status: DC | PRN
Start: 2016-05-10 — End: 2016-05-13
  Administered 2016-05-10: 100 mg via ORAL
  Filled 2016-05-10: qty 1

## 2016-05-10 NOTE — Progress Notes (Signed)
Notified dr.diamond of valporic acid level of 203 from 196 yesterday. Acknowledged and no new orders.

## 2016-05-10 NOTE — Progress Notes (Signed)
[facility] - [place] at [facility]   PATIENT NAME: Keith Chung    MR#:  [id number]  DATE OF BIRTH:  25-Jun-1981  SUBJECTIVE:  CHIEF COMPLAINT:   Chief Complaint  Patient presents with  . Seizures   Continues to complain of white spots in his eye. Weakness. Seizures. Tremors, anxiety. Most of his symptoms have been going on for 6 months.  REVIEW OF SYSTEMS:    Review of Systems  Constitutional: Positive for malaise/fatigue. Negative for chills and fever.  HENT: Negative for sore throat.   Eyes: Negative for blurred vision, double vision and pain.  Respiratory: Negative for cough, hemoptysis, shortness of breath and wheezing.   Cardiovascular: Negative for chest pain, palpitations, orthopnea and leg swelling.  Gastrointestinal: Negative for abdominal pain, constipation, diarrhea, heartburn, nausea and vomiting.  Genitourinary: Negative for dysuria and hematuria.  Musculoskeletal: Negative for back pain and joint pain.  Skin: Negative for rash.  Neurological: Positive for dizziness, seizures and weakness. Negative for sensory change, speech change, focal weakness and headaches.  Endo/Heme/Allergies: Does not bruise/bleed easily.  Psychiatric/Behavioral: Negative for depression. The patient is not nervous/anxious.     DRUG ALLERGIES:   Allergies  Allergen Reactions  . Pollen Extract Other (See Comments)    Runny nose, watery eyes, sneezing    VITALS:  Blood pressure 136/81, pulse 64, temperature 97.6 F (36.4 C), temperature source Oral, resp. rate 18, height 6\' 2"  (1.88 m), weight 75.8 kg (167 lb), SpO2 99 %.  PHYSICAL EXAMINATION:   Physical Exam  GENERAL:  35 y.o.-year-old patient lying in the bed with no acute distress.  EYES: Pupils equal, round, reactive to light and accommodation. No scleral icterus. Extraocular muscles intact.  HEENT: Head atraumatic, normocephalic. Oropharynx and nasopharynx clear.  NECK:  Supple, no jugular venous  distention. No thyroid enlargement, no tenderness.  LUNGS: Normal breath sounds bilaterally, no wheezing, rales, rhonchi. No use of accessory muscles of respiration.  CARDIOVASCULAR: S1, S2 normal. No murmurs, rubs, or gallops.  ABDOMEN: Soft, nontender, nondistended. Bowel sounds present. No organomegaly or mass.  EXTREMITIES: No cyanosis, clubbing or edema b/l.    NEUROLOGIC: Cranial nerves II through XII are intact. No focal Motor or sensory deficits b/l.   PSYCHIATRIC: The patient is alert and oriented x 3.  SKIN: No obvious rash, lesion, or ulcer.   LABORATORY PANEL:   CBC  Recent Labs Lab 05/09/16 1326  WBC 3.4*  HGB 14.0  HCT 39.7*  PLT 200   ------------------------------------------------------------------------------------------------------------------ Chemistries   Recent Labs Lab 05/09/16 1326  NA 137  K 3.9  CL 104  CO2 26  GLUCOSE 86  BUN 17  CREATININE 1.22  CALCIUM 8.9  AST 18  ALT 10*  ALKPHOS 38  BILITOT 0.3   ------------------------------------------------------------------------------------------------------------------  Cardiac Enzymes No results for input(s): TROPONINI in the last 168 hours. ------------------------------------------------------------------------------------------------------------------  RADIOLOGY:  No results found.   ASSESSMENT AND PLAN:   Keith Hoffrnest Larocque  is a 35 y.o. male with a known history of Seizure disorder on antiepileptics followed by Dr. Malvin JohnsPotter comes to the emergency room after he reports having several seizures   1. Depakote toxicity Patient came in with level of 196 Today's is worse at greater than 200 Valproic acid discontinued -IV fluids -Follow levels -We'll have neurology consultation with Dr. Thad Rangereynolds  2. Seizure disorder -Patient currently is on Depakote which according to him is to be tapered off once his Lamictal is increased gradually -He also takes Keppra 500 twice a day  which I'll  continue -We'll discuss with Dr. Thad Rangereynolds patient's seizure meds -Seizure precautions while patient is in house  3. Dental caries -Patient has been prescribed amoxicillin 500 mg 3 times a day from his dentist which which is being continued.  4. DVT prophylaxis subcutaneous Lovenox  All the records are reviewed and case discussed with Care Management/Social Workerr. Management plans discussed with the patient, family and they are in agreement.  CODE STATUS: FULL CODE  DVT Prophylaxis: SCDs  TOTAL TIME TAKING CARE OF THIS PATIENT: 35 minutes.   POSSIBLE D/C IN 40 DAYS, DEPENDING ON CLINICAL CONDITION.  Milagros LollSudini, Lynda Capistran R M.D on 05/10/2016 at 12:16 PM  Between 7am to 6pm - Pager - [phone]  After 6pm go to [email or web address] - password [email or web address]  [facility]  Office  [phone]  CC: Primary care physician; Pcp Not In System  Note: This dictation was prepared with Dragon dictation along with smaller phrase technology. Any transcriptional errors that result from this process are unintentional.

## 2016-05-10 NOTE — Progress Notes (Addendum)
[facility]         [place]   05/10/2016  Patient: Keith Chung   Date of Birth:  September 27, 1980  Date of admission:  05/09/2016  Date of Discharge  Likely on 05/11/2016   To Whom it May Concern:   Keith Chung  Is admitted in the hospital and will likely be discharged home on 05/11/2016.   If you have any questions or concerns, please don't hesitate to call.  Sincerely,   Milagros LollSudini, Dorraine Ellender R M.D Office : [phone]   .

## 2016-05-10 NOTE — Progress Notes (Signed)
Notified Dr.willis of pt c/o pain to head and abdomen. Tylenol that is ordered is not working. Acknowledged and new orders placed.

## 2016-05-10 NOTE — Consult Note (Signed)
Reason for Consult:Depakote toxicity Referring Physician: Sudini  CC: Seizures  HPI: Keith Bottomrnest C Console Jr. is an 35 y.o. male with a known history of seizure disorder on antiepileptics followed by Keith Chung comes to the emergency room after he reports having several seizures yesterday. Patient was supposed to be started on Lamictal with a gradually increasing dose which was prescribed by Keith Chung on Tuesday however he could not go to the pharmacy. He came to the emergency room and reported had being seeing some white spots in his eyes, feeling very sleepy and drowsy and he thinks he is having seizures. His Depakote level was 196  Past Medical History:  Diagnosis Date  . Learning disorder   . Seasonal allergies   . Seizures (HCC)     Past Surgical History:  Procedure Laterality Date  . HAND SURGERY     right hand surgery  . VASECTOMY Bilateral     Family History  Problem Relation Age of Onset  . Cancer Father     Unsure of type  . Hypertension Father   . Hypertension Mother   . Diabetes Mellitus II Maternal Grandmother     Social History:  reports that he quit smoking about 21 months ago. His smoking use included Cigarettes. He smoked 0.30 packs per day. He has never used smokeless tobacco. He reports that he does not drink alcohol or use drugs.  Allergies  Allergen Reactions  . Pollen Extract Other (See Comments)    Runny nose, watery eyes, sneezing    Medications:  I have reviewed the patient's current medications. Prior to Admission:  Prescriptions Prior to Admission  Medication Sig Dispense Refill Last Dose  . divalproex (DEPAKOTE) 500 MG DR tablet Please take 1000 mg in am and 1500mg  at bedtime. (Patient taking differently: Take 1,000 mg by mouth 2 (two) times daily. ) 150 tablet 1 05/09/2016 at 0700  . levETIRAcetam (KEPPRA) 500 MG tablet Take 1500 mg in am and 1500 mg in pm. 180 tablet 1 05/09/2016 at 0700   Scheduled: . amoxicillin  500 mg Oral Q8H  .  enoxaparin (LOVENOX) injection  40 mg Subcutaneous Q24H  . lamoTRIgine  25 mg Oral Daily  . levETIRAcetam  500 mg Oral BID    ROS: History obtained from chart review  General ROS:  fatigue Psychological ROS: negative for - behavioral disorder, hallucinations, memory difficulties, mood swings or suicidal ideation Ophthalmic ROS: as noted in HPI ENT ROS: negative for - epistaxis, nasal discharge, oral lesions, sore throat, tinnitus or vertigo Allergy and Immunology ROS: negative for - hives or itchy/watery eyes Hematological and Lymphatic ROS: negative for - bleeding problems, bruising or swollen lymph nodes Endocrine ROS: negative for - galactorrhea, hair pattern changes, polydipsia/polyuria or temperature intolerance Respiratory ROS: negative for - cough, hemoptysis, shortness of breath or wheezing Cardiovascular ROS: negative for - chest pain, dyspnea on exertion, edema or irregular heartbeat Gastrointestinal ROS: negative for - abdominal pain, diarrhea, hematemesis, nausea/vomiting or stool incontinence Genito-Urinary ROS: negative for - dysuria, hematuria, incontinence or urinary frequency/urgency Musculoskeletal ROS: negative for - joint swelling or muscular weakness Neurological ROS: as noted in HPI Dermatological ROS: negative for rash and skin lesion changes  Physical Examination: Blood pressure 135/75, pulse 70, temperature 97.7 F (36.5 C), temperature source Oral, resp. rate 19, height 6\' 2"  (1.88 m), weight 75.8 kg (167 lb), SpO2 98 %.  HEENT-  Normocephalic, no lesions, without obvious abnormality.  Normal external eye and conjunctiva.  Normal TM's bilaterally.  Normal  auditory canals and external ears. Normal external nose, mucus membranes and septum.  Normal pharynx. Cardiovascular- S1, S2 normal, pulses palpable throughout   Lungs- chest clear, no wheezing, rales, normal symmetric air entry Abdomen- soft, non-tender; bowel sounds normal; no masses,  no  organomegaly Extremities- no edema Lymph-no adenopathy palpable Musculoskeletal-no joint tenderness, deformity or swelling Skin-warm and dry, no hyperpigmentation, vitiligo, or suspicious lesions  Neurological Examination Mental Status: Alert.  Gives only one word answers to questions.  Follows some simple commands but for the most part is not cooperative.  Cranial Nerves: II: Discs flat bilaterally; Visual fields grossly normal, pupils equal, round, reactive to light and accommodation III,IV, VI: ptosis not present, extra-ocular motions intact bilaterally V,VII: smile symmetric, facial light touch sensation normal bilaterally VIII: hearing normal bilaterally IX,X: gag reflex present XI: bilateral shoulder shrug XII: midline tongue extension Motor: Right : Upper extremity   5/5    Left:     Upper extremity   5/5  Lower extremity   5/5     Lower extremity   5/5 Tone and bulk:normal tone throughout; no atrophy noted Sensory: Pinprick and light touch intact throughout, bilaterally Deep Tendon Reflexes: 2+ and symmetric throughout Plantars: Right: downgoing   Left: downgoing Cerebellar: normal finger-to-nose, normal rapid alternating movements and normal heel-to-shin test Gait: normal gait and station    Laboratory Studies:   Basic Metabolic Panel:  Recent Labs Lab 05/09/16 1326  NA 137  K 3.9  CL 104  CO2 26  GLUCOSE 86  BUN 17  CREATININE 1.22  CALCIUM 8.9    Liver Function Tests:  Recent Labs Lab 05/09/16 1326  AST 18  ALT 10*  ALKPHOS 38  BILITOT 0.3  PROT 6.9  ALBUMIN 4.0   No results for input(s): LIPASE, AMYLASE in the last 168 hours. No results for input(s): AMMONIA in the last 168 hours.  CBC:  Recent Labs Lab 05/09/16 1326  WBC 3.4*  NEUTROABS 1.7  HGB 14.0  HCT 39.7*  MCV 85.5  PLT 200    Cardiac Enzymes: No results for input(s): CKTOTAL, CKMB, CKMBINDEX, TROPONINI in the last 168 hours.  BNP: Invalid input(s): POCBNP  CBG: No  results for input(s): GLUCAP in the last 168 hours.  Microbiology: No results found for this or any previous visit.  Coagulation Studies: No results for input(s): LABPROT, INR in the last 72 hours.  Urinalysis: No results for input(s): COLORURINE, LABSPEC, PHURINE, GLUCOSEU, HGBUR, BILIRUBINUR, KETONESUR, PROTEINUR, UROBILINOGEN, NITRITE, LEUKOCYTESUR in the last 168 hours.  Invalid input(s): APPERANCEUR  Lipid Panel:     Component Value Date/Time   CHOL 150 11/02/2015   TRIG 69 11/02/2015   HDL 54 11/02/2015   LDLCALC 82 11/02/2015    HgbA1C: No results found for: HGBA1C  Urine Drug Screen:     Component Value Date/Time   LABOPIA NONE DETECTED 01/05/2016 1143   LABOPIA NONE DETECTED 05/12/2015 1752   COCAINSCRNUR NONE DETECTED 01/05/2016 1143   LABBENZ NONE DETECTED 01/05/2016 1143   LABBENZ NONE DETECTED 05/12/2015 1752   AMPHETMU NONE DETECTED 01/05/2016 1143   AMPHETMU NONE DETECTED 05/12/2015 1752   THCU NONE DETECTED 01/05/2016 1143   THCU POSITIVE (A) 05/12/2015 1752   LABBARB NONE DETECTED 01/05/2016 1143   LABBARB NONE DETECTED 05/12/2015 1752    Alcohol Level: No results for input(s): ETH in the last 168 hours.   Imaging: No results found.   Assessment/Plan: 35 year old male presenting with possible seizures and an elevated Depakote level.  Patient has had issues with his Depakote level in the past.  On presentation was 196.  Today repeat level is 203.  Patient will not respond enough to determine how he has been taking his medication.  Recent prescription given for Lamictal which he has not started.  Patient has been seizure free since admission.    Recommendations: 1.  Continue to hold Depakote.  Would repeat level in AM 2.  Would hold Lamictal until Depakote dose is closer to therapeutic since this may increase the likelihood of side effects.   3.  Continue seizure precautions 4.  Continue Keppra at current dose 5.  Ativan prn for breakthrough seizure  activity.    Thana Farr, MD Neurology [phone] 05/10/2016, 1:51 PM

## 2016-05-11 DIAGNOSIS — F8189 Other developmental disorders of scholastic skills: Secondary | ICD-10-CM

## 2016-05-11 DIAGNOSIS — G40909 Epilepsy, unspecified, not intractable, without status epilepticus: Principal | ICD-10-CM

## 2016-05-11 NOTE — Consult Note (Signed)
[facility]   Reason for Consult:  Consult for this 35 year old man with a seizure disorder. Referring Physician:  Cherlynn KaiserSainani Patient Identification: Keith BottomErnest C Sidor Jr. MRN:  [id number] Principal Diagnosis: <principal problem not specified> Diagnosis:   Patient Active Problem List   Diagnosis Date Noted  . 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 Seizure (HCC) [R56.9] 04/18/2016  . Epilepsy (HCC) [G40.909] 10/17/2015    Total Time spent with patient: 45 minutes  Subjective:   Keith Bottomrnest C Sano Jr. is a 35 y.o. male patient admitted with "I have epileptic seizures".  HPI:  Patient seen. Chart reviewed. Labs reviewed. I'm familiar with this patient from a previous encounter as well. 35 year old man came into the hospital with symptoms of nausea and sedation and was found to be toxic on valproic acid. Concern was expressed that he may be taking extra valproic acid on his own even after being told that he was toxic on it as his level is quite high and reportedly he had thrown up some fragments of pills. I directly discuss this with the patient and he denied that he had taken any extra Depakote. He said that he had not had any in the room and that people had taken his pills away. He denied that he had any wish whatsoever to take extra medicine. Denied being depressed. Denied suicidal thoughts. Denied psychotic symptoms. He simply said that he had no idea how it came to be that he had been feeling sick and that his level was high. Patient says that he tries to keep track of his pills but keeps them in the bottles regularly. Indicates that his brother gives him some assistance in trying to monitor them. He seems a little confused about the exact amounts and timing and switching of medicines that he is supposed to be on.  Social history: Lives with his brother and his brother's family. Patient is disabled and does not work and says that most of the time he does. Little  during the day.  Medical history: Seizure disorder. Followed by neurologist. Has been on multiple anticonvulsant medicines.  Substance abuse history: Denies alcohol or drug abuse currently or in the past.  Past Psychiatric History: I seen the patient before for an almost identical episode. At that time we could never figure out what it made his Depakote level go high. He had completely denied having intentionally tried to take any extra Depakote. There wasn't any indication of any particular psychiatric pathology. No history of suicide attempts.   Risk to Self: Is patient at risk for suicide?: No Risk to Others:   Prior Inpatient Therapy:   Prior Outpatient Therapy:    Past Medical History:  Past Medical History:  Diagnosis Date  . Learning disorder   . Seasonal allergies   . Seizures (HCC)     Past Surgical History:  Procedure Laterality Date  . HAND SURGERY     right hand surgery  . VASECTOMY Bilateral    Family History:  Family History  Problem Relation Age of Onset  . Cancer Father     Unsure of type  . Hypertension Father   . Hypertension Mother   . Diabetes Mellitus II Maternal Grandmother    Family Psychiatric  History: No known family history Social History:  History  Alcohol Use No    Comment: socially     History  Drug Use No    Comment: No longer using - last smoked 04/2015.    Social History  Social History  . Marital status: Single    Spouse name: N/A  . Number of children: 4  . Years of education: GED   Occupational History  . Unemployed    Social History Main Topics  . Smoking status: Former Smoker    Packs/day: 0.30    Types: Cigarettes    Quit date: 07/18/2014  . Smokeless tobacco: Never Used     Comment: stopped 4 months ago  . Alcohol use No     Comment: socially  . Drug use: No     Comment: No longer using - last smoked 04/2015.  Marland Kitchen Sexual activity: Yes    Birth control/ protection: Surgical   Other Topics Concern  . None    Social History Narrative   Not driving or working due to seizures.   Lives at home with his brother.   Right-handed.   Occasional use of caffeine.   Additional Social History:    Allergies:   Allergies  Allergen Reactions  . Pollen Extract Other (See Comments)    Runny nose, watery eyes, sneezing    Labs:  Results for orders placed or performed during the hospital encounter of 05/09/16 (from the past 48 hour(s))  Valproic acid level     Status: Abnormal   Collection Time: 05/10/16  4:38 AM  Result Value Ref Range   Valproic Acid Lvl 203 (HH) 50.0 - 100.0 ug/mL    Comment: RESULTS CONFIRMED BY MANUAL DILUTION CRITICAL RESULT CALLED TO, READ BACK BY AND VERIFIED WITH: MELISSA CARABALLO ON 05/10/16 AT 0640 BY TLB   Valproic acid level     Status: Abnormal   Collection Time: 05/11/16  5:00 AM  Result Value Ref Range   Valproic Acid Lvl 238 (HH) 50.0 - 100.0 ug/mL    Comment: RESULT CONFIRMED BY MANUAL DILUTION    Current Facility-Administered Medications  Medication Dose Route Frequency Provider Last Rate Last Dose  . 0.9 %  sodium chloride infusion   Intravenous Continuous Enedina Finner, MD 100 mL/hr at 05/11/16 1124    . acetaminophen (TYLENOL) tablet 650 mg  650 mg Oral Q6H PRN Enedina Finner, MD   650 mg at 05/10/16 0518   Or  . acetaminophen (TYLENOL) suppository 650 mg  650 mg Rectal Q6H PRN Enedina Finner, MD      . amoxicillin (AMOXIL) capsule 500 mg  500 mg Oral Q8H Enedina Finner, MD   500 mg at 05/11/16 1606  . docusate sodium (COLACE) capsule 100 mg  100 mg Oral Daily PRN Oralia Manis, MD   100 mg at 05/10/16 2045  . enoxaparin (LOVENOX) injection 40 mg  40 mg Subcutaneous Q24H Enedina Finner, MD   40 mg at 05/10/16 2025  . levETIRAcetam (KEPPRA) tablet 500 mg  500 mg Oral BID Enedina Finner, MD   500 mg at 05/11/16 1117  . ondansetron (ZOFRAN) tablet 4 mg  4 mg Oral Q6H PRN Enedina Finner, MD       Or  . ondansetron (ZOFRAN) injection 4 mg  4 mg Intravenous Q6H PRN Enedina Finner, MD   4 mg  at 05/11/16 1300    Musculoskeletal: Strength & Muscle Tone: decreased Gait & Station: unsteady Patient leans: N/A  Psychiatric Specialty Exam: Physical Exam  Nursing note and vitals reviewed. Constitutional: He appears well-developed and well-nourished.  HENT:  Head: Normocephalic and atraumatic.  Eyes: Conjunctivae are normal. Pupils are equal, round, and reactive to light.  Neck: Normal range of motion.  Cardiovascular: Regular rhythm and normal  heart sounds.   Respiratory: Effort normal. No respiratory distress.  GI: Soft.  Musculoskeletal: Normal range of motion.  Neurological: He is alert.  Skin: Skin is warm and dry.  Psychiatric: His affect is blunt. His speech is delayed. He is slowed. Cognition and memory are impaired. He expresses no suicidal ideation.    Review of Systems  Constitutional: Positive for malaise/fatigue.  HENT: Negative.   Eyes: Negative.   Respiratory: Negative.   Cardiovascular: Negative.   Gastrointestinal: Positive for nausea and vomiting.  Musculoskeletal: Negative.   Skin: Negative.   Neurological: Negative.   Psychiatric/Behavioral: Negative for depression, hallucinations, memory loss, substance abuse and suicidal ideas. The patient is not nervous/anxious and does not have insomnia.     Blood pressure 125/78, pulse 72, temperature 98.1 F (36.7 C), temperature source Oral, resp. rate 18, height 6\' 2"  (1.88 m), weight 75.8 kg (167 lb), SpO2 97 %.Body mass index is 21.44 kg/m.  General Appearance: Fairly Groomed  Eye Contact:  Minimal  Speech:  Slow and Slurred  Volume:  Decreased  Mood:  Euthymic  Affect:  Constricted  Thought Process:  Goal Directed  Orientation:  Full (Time, Place, and Person)  Thought Content:  Logical  Suicidal Thoughts:  No  Homicidal Thoughts:  No  Memory:  Immediate;   Good Recent;   Fair Remote;   Fair  Judgement:  Fair  Insight:  Fair  Psychomotor Activity:  Decreased  Concentration:  Concentration: Poor   Recall:  [employer] of Knowledge:  Fair  Language:  Fair  Akathisia:  Negative  Handed:  Right  AIMS (if indicated):     Assets:  Financial Resources/Insurance Housing Social Support  ADL's:  Intact  Cognition:  Impaired,  Mild  Sleep:        Treatment Plan Summary: Plan 35 year old man with epilepsy who also has a learning disability and probably some cognitive slowing. Depakote toxicity. Patient absolutely denies any intentional consumption of Depakote after being told to stop. Once again I'm not really sure what to make of this situation. I don't see any report or signs of depression or suicidality on his part. I can't think of any reason why he would intentionally be taking extra Depakote. He does seem to have some cognitive impairment and I'm still concerned that maybe he is just not getting the best assistance in managing his medication and that some home health or other outside assistance would be warranted. I'm not going to add or change any medication. We can continue to follow up as needed but at this point I don't see any indication of the specific psychiatric illness to treat.  Disposition: Patient does not meet criteria for psychiatric inpatient admission. Supportive therapy provided about ongoing stressors.  Mordecai Rasmussen, MD 05/11/2016 6:47 PM

## 2016-05-11 NOTE — Progress Notes (Signed)
[facility] - [place] at [facility]   PATIENT NAME: Keith Chung    MR#:  [id number]  DATE OF BIRTH:  1981-01-27  SUBJECTIVE:   Continues to complain of white spots in his eye. Weakness. Also complaining of some nausea, vomiting.  Vomited up pills today which looked like depakote as he has been taking his own pills despite depakote being on hold.   Depakote level increased.   REVIEW OF SYSTEMS:    Review of Systems  Constitutional: Positive for malaise/fatigue. Negative for chills and fever.  HENT: Negative for sore throat.   Eyes: Negative for blurred vision, double vision and pain.  Respiratory: Negative for cough, hemoptysis, shortness of breath and wheezing.   Cardiovascular: Negative for chest pain, palpitations, orthopnea and leg swelling.  Gastrointestinal: Negative for abdominal pain, constipation, diarrhea, heartburn, nausea and vomiting.  Genitourinary: Negative for dysuria and hematuria.  Musculoskeletal: Negative for back pain and joint pain.  Skin: Negative for rash.  Neurological: Positive for dizziness, seizures and weakness. Negative for sensory change, speech change, focal weakness and headaches.  Endo/Heme/Allergies: Does not bruise/bleed easily.  Psychiatric/Behavioral: Negative for depression. The patient is not nervous/anxious.     DRUG ALLERGIES:   Allergies  Allergen Reactions  . Pollen Extract Other (See Comments)    Runny nose, watery eyes, sneezing    VITALS:  Blood pressure 125/78, pulse 72, temperature 98.1 F (36.7 C), temperature source Oral, resp. rate 18, height 6\' 2"  (1.88 m), weight 75.8 kg (167 lb), SpO2 97 %.  PHYSICAL EXAMINATION:   Physical Exam  GENERAL:  35 y.o.-year-old patient lying in the bed lethargic in no acute distress.  EYES: Pupils equal, round, reactive to light and accommodation. No scleral icterus. Extraocular muscles intact.  HEENT: Head atraumatic, normocephalic. Oropharynx and nasopharynx  clear.  NECK:  Supple, no jugular venous distention. No thyroid enlargement, no tenderness.  LUNGS: Normal breath sounds bilaterally, no wheezing, rales, rhonchi. No use of accessory muscles of respiration.  CARDIOVASCULAR: S1, S2 normal. No murmurs, rubs, or gallops.  ABDOMEN: Soft, nontender, nondistended. Bowel sounds present. No organomegaly or mass.  EXTREMITIES: No cyanosis, clubbing or edema b/l.    NEUROLOGIC: Cranial nerves II through XII are intact. No focal Motor or sensory deficits b/l.   PSYCHIATRIC: The patient is alert and oriented x 3. Flat affect.  SKIN: No obvious rash, lesion, or ulcer.   LABORATORY PANEL:   CBC  Recent Labs Lab 05/09/16 1326  WBC 3.4*  HGB 14.0  HCT 39.7*  PLT 200   ------------------------------------------------------------------------------------------------------------------ Chemistries   Recent Labs Lab 05/09/16 1326  NA 137  K 3.9  CL 104  CO2 26  GLUCOSE 86  BUN 17  CREATININE 1.22  CALCIUM 8.9  AST 18  ALT 10*  ALKPHOS 38  BILITOT 0.3   ------------------------------------------------------------------------------------------------------------------  Cardiac Enzymes No results for input(s): TROPONINI in the last 168 hours. ------------------------------------------------------------------------------------------------------------------  RADIOLOGY:  No results found.   ASSESSMENT AND PLAN:   Keith Chung  is a 35 y.o. male with a known history of Seizure disorder on antiepileptics followed by Dr. Malvin Johns comes to the emergency room after he reports having several seizures   1. Depakote toxicity - this is the cause of pt's conglomerate of symptoms.  - Depakote level increasing. ?? Pt. Taking his own meds in the room despite it being on hold.  Meds taken from room to pharmacy.  - appreciate Neuro input and cont. Keppra, PRN ativan for seizures and hold Depakote.  -  We'll have neurology consultation with Dr.  Thad Rangereynolds  2. Seizure disorder -  No acute seizures overnight.  - cont. Keppra, Seizure precautions.  - appreciate Neuro input and cont. To hold Depakote for now.   3. Dental caries -Patient has been prescribed amoxicillin 500 mg 3 times a day from his dentist which which is being continued.  4. Flat affect/pt. Taking his own pills despite Depakote being on hold - will get Psych consult.   All the records are reviewed and case discussed with Care Management/Social Workerr. Management plans discussed with the patient, family and they are in agreement.  CODE STATUS: FULL CODE  DVT Prophylaxis: SCDs  TOTAL TIME TAKING CARE OF THIS PATIENT: 30 minutes.   POSSIBLE D/C IN 2-3 DAYS, DEPENDING ON CLINICAL CONDITION.  [place] SirenSAINANI,Renata Gambino J M.D on 05/11/2016 at 3:29 PM  Between 7am to 6pm - Pager - [phone]  After 6pm go to [email or web address] - password [email or web address]  [facility]  Office  [phone]  CC: Primary care physician; Pcp Not In System  Note: This dictation was prepared with Dragon dictation along with smaller phrase technology. Any transcriptional errors that result from this process are unintentional.

## 2016-05-11 NOTE — Progress Notes (Signed)
Pt vomited in the floor and was found to have 4 Divalproex pills in vomit. This medication was d/c'd on admission due to toxic levels in his blood. Pt was asked by RN how he got this medication and he would not answer. Pt was then asked if he had medication in his room and he said, "No." RN then looked in his belongings bag and found his medications, Keppra and Divalproex. RN then counted meds with charge nurse and took medications to pharmacy.

## 2016-05-12 LAB — VALPROIC ACID LEVEL: Valproic Acid Lvl: 114 ug/mL — ABNORMAL HIGH (ref 50.0–100.0)

## 2016-05-12 NOTE — Plan of Care (Signed)
Problem: Health Behavior/Discharge Planning: Goal: Ability to manage health-related needs will improve Outcome: Progressing Patient compliant with medication this shift. Educated on reason why he cannot take home meds while in hospital.

## 2016-05-12 NOTE — Progress Notes (Signed)
[facility] [place] [facility]   PATIENT NAME: Keith Chung    MR#:  [id number]  DATE OF BIRTH:  1981/08/27  SUBJECTIVE:   Mom at bedside.  Depakote level down. Still seeing white spots. Complaining of vague abdominal pain.    REVIEW OF SYSTEMS:    Review of Systems  Constitutional: Positive for malaise/fatigue. Negative for chills and fever.  HENT: Negative for sore throat.   Eyes: Negative for blurred vision, double vision and pain.  Respiratory: Negative for cough, hemoptysis, shortness of breath and wheezing.   Cardiovascular: Negative for chest pain, palpitations, orthopnea and leg swelling.  Gastrointestinal: Negative for abdominal pain, constipation, diarrhea, heartburn, nausea and vomiting.  Genitourinary: Negative for dysuria and hematuria.  Musculoskeletal: Negative for back pain and joint pain.  Skin: Negative for rash.  Neurological: Positive for dizziness and weakness. Negative for sensory change, speech change, focal weakness, seizures and headaches.  Endo/Heme/Allergies: Does not bruise/bleed easily.  Psychiatric/Behavioral: Negative for depression. The patient is not nervous/anxious.     DRUG ALLERGIES:   Allergies  Allergen Reactions  . Pollen Extract Other (See Comments)    Runny nose, watery eyes, sneezing    VITALS:  Blood pressure 120/81, pulse 75, temperature 97.6 F (36.4 C), temperature source Oral, resp. rate 19, height 6\' 2"  (1.88 m), weight 75.8 kg (167 lb), SpO2 98 %.  PHYSICAL EXAMINATION:   Physical Exam  GENERAL:  35 y.o.-year-old patient lying in the bed in no acute distress.  EYES: Pupils equal, round, reactive to light and accommodation. No scleral icterus. Extraocular muscles intact.  HEENT: Head atraumatic, normocephalic. Oropharynx and nasopharynx clear.  NECK:  Supple, no jugular venous distention. No thyroid enlargement, no tenderness.  LUNGS: Normal breath sounds bilaterally, no wheezing, rales,  rhonchi. No use of accessory muscles of respiration.  CARDIOVASCULAR: S1, S2 normal. No murmurs, rubs, or gallops.  ABDOMEN: Soft, nontender, nondistended. Bowel sounds present. No organomegaly or mass.  EXTREMITIES: No cyanosis, clubbing or edema b/l.    NEUROLOGIC: Cranial nerves II through XII are intact. No focal Motor or sensory deficits b/l.   PSYCHIATRIC: The patient is alert and oriented x 3. Flat affect.  SKIN: No obvious rash, lesion, or ulcer.   LABORATORY PANEL:   CBC  Recent Labs Lab 05/09/16 1326  WBC 3.4*  HGB 14.0  HCT 39.7*  PLT 200   ------------------------------------------------------------------------------------------------------------------ Chemistries   Recent Labs Lab 05/09/16 1326  NA 137  K 3.9  CL 104  CO2 26  GLUCOSE 86  BUN 17  CREATININE 1.22  CALCIUM 8.9  AST 18  ALT 10*  ALKPHOS 38  BILITOT 0.3   ------------------------------------------------------------------------------------------------------------------  Cardiac Enzymes No results for input(s): TROPONINI in the last 168 hours. ------------------------------------------------------------------------------------------------------------------  RADIOLOGY:  No results found.   ASSESSMENT AND PLAN:   Keith Chung  is a 35 y.o. male with a known history of Seizure disorder on antiepileptics followed by Dr. Malvin JohnsPotter comes to the emergency room after he reports having several seizures   1. Depakote toxicity - this is the cause of pt's conglomerate of symptoms.  - Depakote level trending down today. ?? Pt. was Taking his own meds in the room despite it being on hold.  Meds taken from room to pharmacy.  - appreciate Neuro input and cont. Keppra, PRN ativan for seizures and hold Depakote.  - improving.  2. Seizure disorder -  No acute seizures overnight.  - cont. Keppra, Seizure precautions.  - appreciate Neuro input  and cont. To hold Depakote for now.   3. Dental  caries -Patient has been prescribed amoxicillin 500 mg 3 times a day from his dentist which which is being continued.  4. Flat affect/pt. Taking his own pills despite Depakote being on hold - appreciate Psych consult and no change in meds and no need for inpatient psychiatric services.   If Depakote level trending down tomorrow then will d/c home. Discussed w/ mom at bedside.  All the records are reviewed and case discussed with Care Management/Social Workerr. Management plans discussed with the patient, family and they are in agreement.  CODE STATUS: FULL CODE  DVT Prophylaxis: SCDs  TOTAL TIME TAKING CARE OF THIS PATIENT: 30 minutes.   POSSIBLE D/C IN 1-2 DAYS, DEPENDING ON CLINICAL CONDITION.  Houston Siren M.D on 05/12/2016 at 1:31 PM  Between 7am to 6pm - Pager - [phone]  After 6pm go to [email or web address] - password [email or web address]  [facility]  Office  [phone]  CC: Primary care physician; Pcp Not In System  Note: This dictation was prepared with Dragon dictation along with smaller phrase technology. Any transcriptional errors that result from this process are unintentional.

## 2016-05-13 LAB — BASIC METABOLIC PANEL
Anion gap: 4 — ABNORMAL LOW (ref 5–15)
BUN: 15 mg/dL (ref 6–20)
CALCIUM: 8.8 mg/dL — AB (ref 8.9–10.3)
CO2: 30 mmol/L (ref 22–32)
CREATININE: 1.16 mg/dL (ref 0.61–1.24)
Chloride: 103 mmol/L (ref 101–111)
Glucose, Bld: 84 mg/dL (ref 65–99)
Potassium: 4.3 mmol/L (ref 3.5–5.1)
SODIUM: 137 mmol/L (ref 135–145)

## 2016-05-13 LAB — VALPROIC ACID LEVEL: VALPROIC ACID LVL: 54 ug/mL (ref 50.0–100.0)

## 2016-05-13 MED ORDER — DIVALPROEX SODIUM 500 MG PO DR TAB: 500.0000 mg | DELAYED_RELEASE_TABLET | Freq: Three times a day (TID) | ORAL | 1 refills | Status: DC

## 2016-05-13 NOTE — Discharge Summary (Signed)
[facility] [place] at [facility]   PATIENT NAME: Keith Chung    MR#:  [id number]  DATE OF BIRTH:  1980-10-15  DATE OF ADMISSION:  05/09/2016 ADMITTING PHYSICIAN: Enedina Finner, MD  DATE OF DISCHARGE: 05/13/2016  PRIMARY CARE PHYSICIAN: Pcp Not In System    ADMISSION DIAGNOSIS:  Valproic acid toxicity, accidental or unintentional, initial encounter [T42.6X1A]  DISCHARGE DIAGNOSIS:  Active Problems:   Valproic acid toxicity   SECONDARY DIAGNOSIS:   Past Medical History:  Diagnosis Date  . Learning disorder   . Seasonal allergies   . Seizures [facility])     HOSPITAL COURSE:   Keith Chung a 35 y.o. malewith a known history of Seizure disorder on antiepileptics followed by Dr. Malvin Johns comes to the emergency room after he reports having several seizures   1. Depakote toxicity - this was the cause of pt's conglomerate of symptoms.  - Depakote level down to 54 today.  Patient's Depakote dose has been adjusted. He'll be discharged on 500 mg 3 times a day as per neurology. He will follow-up with his neurologist Dr. Malvin Johns as an outpatient.  2. Seizure disorder -   patient has had no acute seizures while in the hospital. He will continue his Keppra upon discharge. He'll follow-up with his neurologist Dr. Malvin Johns as an outpatient.  3. Flat affect/pt. Taking his own pills despite Depakote being on hold -  Pt. Was seen by Psychiatry and they did not change in meds and he has no need for inpatient psychiatric services.   D/c home today.   DISCHARGE CONDITIONS:   Stable.   CONSULTS OBTAINED:  Treatment Team:  Thana Farr, MD Audery Amel, MD  DRUG ALLERGIES:   Allergies  Allergen Reactions  . Pollen Extract Other (See Comments)    Runny nose, watery eyes, sneezing    DISCHARGE MEDICATIONS:     Medication List    TAKE these medications   divalproex 500 MG DR tablet Commonly known as:  DEPAKOTE Take 1 tablet (500 mg total) by mouth 3  (three) times daily. What changed:  how much to take  how to take this  when to take this  additional instructions   levETIRAcetam 500 MG tablet Commonly known as:  KEPPRA Take 1500 mg in am and 1500 mg in pm.         DISCHARGE INSTRUCTIONS:   DIET:  Regular diet  DISCHARGE CONDITION:  Stable  ACTIVITY:  Activity as tolerated  OXYGEN:  Home Oxygen: No.   Oxygen Delivery: room air  DISCHARGE LOCATION:  home   If you experience worsening of your admission symptoms, develop shortness of breath, life threatening emergency, suicidal or homicidal thoughts you must seek medical attention immediately by calling 911 or calling your MD immediately  if symptoms less severe.  You Must read complete instructions/literature along with all the possible adverse reactions/side effects for all the Medicines you take and that have been prescribed to you. Take any new Medicines after you have completely understood and accpet all the possible adverse reactions/side effects.   Please note  You were cared for by a hospitalist during your hospital stay. If you have any questions about your discharge medications or the care you received while you were in the hospital after you are discharged, you can call the unit and asked to speak with the hospitalist on call if the hospitalist that took care of you is not available. Once you are discharged, your primary care physician will handle  any further medical issues. Please note that NO REFILLS for any discharge medications will be authorized once you are discharged, as it is imperative that you return to your primary care physician (or establish a relationship with a primary care physician if you do not have one) for your aftercare needs so that they can reassess your need for medications and monitor your lab values.     Today   Complaining of some dizziness but Orthostatic vital signs are (-).  D/c home today.   VITAL SIGNS:  Blood pressure  (!) 138/92, pulse 70, temperature 97.5 F (36.4 C), temperature source Oral, resp. rate 19, height 6\' 2"  (1.88 m), weight 75.8 kg (167 lb), SpO2 100 %.  I/O:   Intake/Output Summary (Last 24 hours) at 05/13/16 1359 Last data filed at 05/13/16 0508  Gross per 24 hour  Intake                0 ml  Output                0 ml  Net                0 ml    PHYSICAL EXAMINATION:  GENERAL:  35 y.o.-year-old patient lying in the bed with no acute distress.  EYES: Pupils equal, round, reactive to light and accommodation. No scleral icterus. Extraocular muscles intact.  HEENT: Head atraumatic, normocephalic. Oropharynx and nasopharynx clear.  NECK:  Supple, no jugular venous distention. No thyroid enlargement, no tenderness.  LUNGS: Normal breath sounds bilaterally, no wheezing, rales,rhonchi. No use of accessory muscles of respiration.  CARDIOVASCULAR: S1, S2 normal. No murmurs, rubs, or gallops.  ABDOMEN: Soft, non-tender, non-distended. Bowel sounds present. No organomegaly or mass.  EXTREMITIES: No pedal edema, cyanosis, or clubbing.  NEUROLOGIC: Cranial nerves II through XII are intact. No focal motor or sensory defecits b/l.  PSYCHIATRIC: The patient is alert and oriented x 3. Good affect.  SKIN: No obvious rash, lesion, or ulcer.   DATA REVIEW:   CBC  Recent Labs Lab 05/09/16 1326  WBC 3.4*  HGB 14.0  HCT 39.7*  PLT 200    Chemistries   Recent Labs Lab 05/09/16 1326 05/13/16 0601  NA 137 137  K 3.9 4.3  CL 104 103  CO2 26 30  GLUCOSE 86 84  BUN 17 15  CREATININE 1.22 1.16  CALCIUM 8.9 8.8*  AST 18  --   ALT 10*  --   ALKPHOS 38  --   BILITOT 0.3  --     Cardiac Enzymes No results for input(s): TROPONINI in the last 168 hours.  Microbiology Results  No results found for this or any previous visit.  RADIOLOGY:  No results found.    Management plans discussed with the patient, family and they are in agreement.  CODE STATUS:     Code Status Orders         Start     Ordered   05/09/16 1756  Full code  Continuous     05/09/16 1755    Code Status History    Date Active Date Inactive Code Status Order ID Comments User Context   04/18/2016  5:12 PM 04/19/2016  6:38 PM Full Code [id number]  Wyatt Haste, MD ED   10/16/2015  6:09 PM 10/18/2015  5:36 PM Full Code [id number]  Enid Baas, MD Inpatient      TOTAL TIME TAKING CARE OF THIS PATIENT: 40 minutes.    Earlyne [email or web address]  on 05/13/2016 at 1:59 PM  Between 7am to 6pm - Pager - [phone]  After 6pm go to [email or web address] - Social research officer, governmentpassword [email or web address]  [facility] [place] [facility]  Office  [phone]  CC: Primary care physician; Pcp Not In System

## 2016-05-13 NOTE — Consult Note (Signed)
Reason for Consult:Depakote toxicity Referring Physician: Sudini  CC: Seizures  HPI: Keith Bottomrnest C Mazur Jr. is an 35 y.o. male with a known history of seizure disorder on antiepileptics followed by Dr. Malvin Chung comes to the emergency room after he reports having several seizures yesterday. Patient was supposed to be started on Lamictal with a gradually increasing dose which was prescribed by Dr. Malvin Chung on Tuesday however he could not go to the pharmacy. He came to the emergency room and reported had being seeing some white spots in his eyes, feeling very sleepy and drowsy and he thinks he is having seizures. His Depakote level was 196  VPA level now normalized.    Past Medical History:  Diagnosis Date  . Learning disorder   . Seasonal allergies   . Seizures (HCC)     Past Surgical History:  Procedure Laterality Date  . HAND SURGERY     right hand surgery  . VASECTOMY Bilateral     Family History  Problem Relation Age of Onset  . Cancer Father     Unsure of type  . Hypertension Father   . Hypertension Mother   . Diabetes Mellitus II Maternal Grandmother     Social History:  reports that he quit smoking about 21 months ago. His smoking use included Cigarettes. He smoked 0.30 packs per day. He has never used smokeless tobacco. He reports that he does not drink alcohol or use drugs.  Allergies  Allergen Reactions  . Pollen Extract Other (See Comments)    Runny nose, watery eyes, sneezing    Medications:  I have reviewed the patient's current medications. Prior to Admission:  Prescriptions Prior to Admission  Medication Sig Dispense Refill Last Dose  . levETIRAcetam (KEPPRA) 500 MG tablet Take 1500 mg in am and 1500 mg in pm. 180 tablet 1 05/09/2016 at 0700  . [DISCONTINUED] divalproex (DEPAKOTE) 500 MG DR tablet Please take 1000 mg in am and 1500mg  at bedtime. (Patient taking differently: Take 1,000 mg by mouth 2 (two) times daily. ) 150 tablet 1 05/09/2016 at 0700    Scheduled: . amoxicillin  500 mg Oral Q8H  . enoxaparin (LOVENOX) injection  40 mg Subcutaneous Q24H  . levETIRAcetam  500 mg Oral BID    ROS: History obtained from chart review  General ROS:  fatigue Psychological ROS: negative for - behavioral disorder, hallucinations, memory difficulties, mood swings or suicidal ideation Ophthalmic ROS: as noted in HPI ENT ROS: negative for - epistaxis, nasal discharge, oral lesions, sore throat, tinnitus or vertigo Allergy and Immunology ROS: negative for - hives or itchy/watery eyes Hematological and Lymphatic ROS: negative for - bleeding problems, bruising or swollen lymph nodes Endocrine ROS: negative for - galactorrhea, hair pattern changes, polydipsia/polyuria or temperature intolerance Respiratory ROS: negative for - cough, hemoptysis, shortness of breath or wheezing Cardiovascular ROS: negative for - chest pain, dyspnea on exertion, edema or irregular heartbeat Gastrointestinal ROS: negative for - abdominal pain, diarrhea, hematemesis, nausea/vomiting or stool incontinence Genito-Urinary ROS: negative for - dysuria, hematuria, incontinence or urinary frequency/urgency Musculoskeletal ROS: negative for - joint swelling or muscular weakness Neurological ROS: as noted in HPI Dermatological ROS: negative for rash and skin lesion changes  Physical Examination: Blood pressure (!) 142/91, pulse (!) 56, temperature 97.5 F (36.4 C), temperature source Oral, resp. rate 18, height 6\' 2"  (1.88 m), weight 75.8 kg (167 lb), SpO2 100 %.  HEENT-  Normocephalic, no lesions, without obvious abnormality.  Normal external eye and conjunctiva.  Normal TM's bilaterally.  Normal auditory canals and external ears. Normal external nose, mucus membranes and septum.  Normal pharynx. Cardiovascular- S1, S2 normal, pulses palpable throughout   Lungs- chest clear, no wheezing, rales, normal symmetric air entry Abdomen- soft, non-tender; bowel sounds normal; no  masses,  no organomegaly Extremities- no edema Lymph-no adenopathy palpable Musculoskeletal-no joint tenderness, deformity or swelling Skin-warm and dry, no hyperpigmentation, vitiligo, or suspicious lesions  Neurological Examination Mental Status: Alert.  Gives only one word answers to questions.  Follows some simple commands but for the most part is not cooperative.  Cranial Nerves: II: Discs flat bilaterally; Visual fields grossly normal, pupils equal, round, reactive to light and accommodation III,IV, VI: ptosis not present, extra-ocular motions intact bilaterally V,VII: smile symmetric, facial light touch sensation normal bilaterally VIII: hearing normal bilaterally IX,X: gag reflex present XI: bilateral shoulder shrug XII: midline tongue extension Motor: Right : Upper extremity   5/5    Left:     Upper extremity   5/5  Lower extremity   5/5     Lower extremity   5/5 Tone and bulk:normal tone throughout; no atrophy noted Sensory: Pinprick and light touch intact throughout, bilaterally Deep Tendon Reflexes: 2+ and symmetric throughout Plantars: Right: downgoing   Left: downgoing Cerebellar: normal finger-to-nose, normal rapid alternating movements and normal heel-to-shin test Gait: normal gait and station    Laboratory Studies:   Basic Metabolic Panel:  Recent Labs Lab 05/09/16 1326 05/13/16 0601  NA 137 137  K 3.9 4.3  CL 104 103  CO2 26 30  GLUCOSE 86 84  BUN 17 15  CREATININE 1.22 1.16  CALCIUM 8.9 8.8*    Liver Function Tests:  Recent Labs Lab 05/09/16 1326  AST 18  ALT 10*  ALKPHOS 38  BILITOT 0.3  PROT 6.9  ALBUMIN 4.0   No results for input(s): LIPASE, AMYLASE in the last 168 hours. No results for input(s): AMMONIA in the last 168 hours.  CBC:  Recent Labs Lab 05/09/16 1326  WBC 3.4*  NEUTROABS 1.7  HGB 14.0  HCT 39.7*  MCV 85.5  PLT 200    Cardiac Enzymes: No results for input(s): CKTOTAL, CKMB, CKMBINDEX, TROPONINI in the last  168 hours.  BNP: Invalid input(s): POCBNP  CBG: No results for input(s): GLUCAP in the last 168 hours.  Microbiology: No results found for this or any previous visit.  Coagulation Studies: No results for input(s): LABPROT, INR in the last 72 hours.  Urinalysis: No results for input(s): COLORURINE, LABSPEC, PHURINE, GLUCOSEU, HGBUR, BILIRUBINUR, KETONESUR, PROTEINUR, UROBILINOGEN, NITRITE, LEUKOCYTESUR in the last 168 hours.  Invalid input(s): APPERANCEUR  Lipid Panel:     Component Value Date/Time   CHOL 150 11/02/2015   TRIG 69 11/02/2015   HDL 54 11/02/2015   LDLCALC 82 11/02/2015    HgbA1C: No results found for: HGBA1C  Urine Drug Screen:      Component Value Date/Time   LABOPIA NONE DETECTED 01/05/2016 1143   LABOPIA NONE DETECTED 05/12/2015 1752   COCAINSCRNUR NONE DETECTED 01/05/2016 1143   LABBENZ NONE DETECTED 01/05/2016 1143   LABBENZ NONE DETECTED 05/12/2015 1752   AMPHETMU NONE DETECTED 01/05/2016 1143   AMPHETMU NONE DETECTED 05/12/2015 1752   THCU NONE DETECTED 01/05/2016 1143   THCU POSITIVE (A) 05/12/2015 1752   LABBARB NONE DETECTED 01/05/2016 1143   LABBARB NONE DETECTED 05/12/2015 1752    Alcohol Level: No results for input(s): ETH in the last 168 hours.   Imaging: No results found.   Assessment/Plan: 35 year  old male presenting with possible seizures and an elevated Depakote level.  Patient has had issues with his Depakote level in the past.  On presentation was 196 with repeat of 203.  Now VPA level normalized.   Pt was on 2500 mg a day.     Currently on home dose of Keppra Please restart VPA at 500 TID and follow up with Dr. Malvin Johns as out pt.     05/13/2016, 1:08 PM

## 2016-05-13 NOTE — Discharge Instructions (Signed)
Take medications only as prescribed.  Please contact your doctor if you should experience any return of or worsening of symptoms and if you should have any questions/concerns.

## 2016-05-14 LAB — VALPROIC ACID LEVEL: Valproic Acid Lvl: 238 ug/mL (ref 50.0–100.0)

## 2016-05-24 ENCOUNTER — Ambulatory Visit: Payer: Self-pay | Admitting: Neurology

## 2016-05-24 ENCOUNTER — Telehealth: Payer: Self-pay | Admitting: *Deleted

## 2016-05-24 NOTE — Telephone Encounter (Signed)
No showed follow up appointment. 

## 2016-05-25 ENCOUNTER — Encounter: Payer: Self-pay | Admitting: Neurology

## 2016-05-30 ENCOUNTER — Ambulatory Visit: Payer: Self-pay | Admitting: Neurology

## 2016-07-07 ENCOUNTER — Emergency Department
Admission: EM | Admit: 2016-07-07 | Discharge: 2016-07-07 | Discharge status: Against Medical Advice | Payer: Medicaid Other | Attending: Emergency Medicine | Admitting: Emergency Medicine

## 2016-07-07 ENCOUNTER — Encounter: Payer: Self-pay | Admitting: Emergency Medicine

## 2016-07-07 DIAGNOSIS — Z79899 Other long term (current) drug therapy: Secondary | ICD-10-CM | POA: Insufficient documentation

## 2016-07-07 DIAGNOSIS — G40909 Epilepsy, unspecified, not intractable, without status epilepticus: Secondary | ICD-10-CM | POA: Insufficient documentation

## 2016-07-07 DIAGNOSIS — Z87891 Personal history of nicotine dependence: Secondary | ICD-10-CM | POA: Diagnosis not present

## 2016-07-07 DIAGNOSIS — R569 Unspecified convulsions: Secondary | ICD-10-CM | POA: Diagnosis present

## 2016-07-07 DIAGNOSIS — R202 Paresthesia of skin: Secondary | ICD-10-CM

## 2016-07-07 NOTE — ED Notes (Signed)
NT notified RN that pt has removed his BP cuff and pulse ox. Pt states he is fine and wants to leave. EDP notified.

## 2016-07-07 NOTE — ED Provider Notes (Signed)
[facility] Emergency Department Provider Note   ____________________________________________   First MD Initiated Contact with Patient 07/07/16 1119     (approximate)  I have reviewed the triage vital signs and the nursing notes.   HISTORY  Chief Complaint Seizures   HPI Keith Chung. is a 35 y.o. male with a history of epilepsy who is presenting to the emergency department after seizure today. He says that he was sitting at his cousin's house and then just remembers waking up with EMS present. He is unable to say how long the seizure lasted or the character of the seizure was like. He says that his last seizure was about one month ago and he has been compliant with his medications. He says that he has numbness to his left arm which is now resolving. He says that he has not had any numbness after previous seizures. Denies any weakness. Denies any headache. Says that he does not drive and sees Dr. Malvin Johns at the [facility] clinic for neurology. He is also requesting to leave and does not want to stay for any further workup.   Past Medical History:  Diagnosis Date  . Learning disorder   . Seasonal allergies   . Seizures [facility])     Patient Active Problem List   Diagnosis Date Noted  . 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 Seizure (HCC) 04/18/2016  . Epilepsy (HCC) 10/17/2015    Past Surgical History:  Procedure Laterality Date  . HAND SURGERY     right hand surgery  . VASECTOMY Bilateral     Prior to Admission medications   Medication Sig Start Date End Date Taking? Authorizing Provider  divalproex (DEPAKOTE) 500 MG DR tablet Take 1 tablet (500 mg total) by mouth 3 (three) times daily. 05/13/16   Houston Siren, MD  levETIRAcetam (KEPPRA) 500 MG tablet Take 1500 mg in am and 1500 mg in pm. 05/08/16   Levert Feinstein, MD    Allergies Pollen extract  Family History  Problem Relation Age of Onset  . Cancer Father     Unsure of type  .  Hypertension Father   . Hypertension Mother   . Diabetes Mellitus II Maternal Grandmother     Social History Social History  Substance Use Topics  . Smoking status: Former Smoker    Packs/day: 0.30    Types: Cigarettes    Quit date: 07/18/2014  . Smokeless tobacco: Never Used     Comment: stopped 4 months ago  . Alcohol use No     Comment: socially    Review of Systems Constitutional: No fever/chills Eyes: No visual changes. ENT: No sore throat. Cardiovascular: Denies chest pain. Respiratory: Denies shortness of breath. Gastrointestinal: No abdominal pain.  No nausea, no vomiting.  No diarrhea.  No constipation. Genitourinary: Negative for dysuria. Musculoskeletal: Negative for back pain. Skin: Negative for rash. Neurological: Negative for headaches, focal weakness   10-point ROS otherwise negative.  ____________________________________________   PHYSICAL EXAM:  VITAL SIGNS: ED Triage Vitals  Enc Vitals Group     BP 07/07/16 1112 119/80     Pulse Rate 07/07/16 1112 79     Resp 07/07/16 1112 20     Temp 07/07/16 1112 97.6 F (36.4 C)     Temp Source 07/07/16 1112 Oral     SpO2 07/07/16 1112 100 %     Weight 07/07/16 1113 167 lb (75.8 kg)     Height 07/07/16 1113 5\' 10"  (1.778 m)  Head Circumference --      Peak Flow --      Pain Score --      Pain Loc --      Pain Edu? --      Excl. in GC? --     Constitutional: Alert and oriented. Well appearing and in no acute distress. Eyes: Conjunctivae are normal. PERRL. EOMI. Head: Atraumatic. Nose: No congestion/rhinnorhea. Mouth/Throat: Mucous membranes are moist.   Neck: No stridor.   Cardiovascular: Normal rate, regular rhythm. Grossly normal heart sounds.   Respiratory: Normal respiratory effort.  No retractions. Lungs CTAB. Gastrointestinal: Soft and nontender. No distention. Musculoskeletal: No lower extremity tenderness nor edema.  No joint effusions. Neurologic:  Normal speech and language.5 out of 5  strength throughout but with mildly decreased sensation to light touch left upper extremity.  No gait instability. Skin:  Skin is warm, dry and intact. No rash noted. Psychiatric: Mood and affect are normal. Speech and behavior are normal.  ____________________________________________   LABS (all labs ordered are listed, but only abnormal results are displayed)  Labs Reviewed - No data to display ____________________________________________  EKG   ____________________________________________  RADIOLOGY   ____________________________________________   PROCEDURES  Procedure(s) performed:   Procedures  Critical Care performed:   ____________________________________________   INITIAL IMPRESSION / ASSESSMENT AND PLAN / ED COURSE  Pertinent labs & imaging results that were available during my care of the patient were reviewed by me and considered in my medical decision making (see chart for details).  ----------------------------------------- 12:01 PM on 07/07/2016 -----------------------------------------  Patient is a and O 3. Denies any drug use. Examine is clinically sober and with decisional capacity. We discussed the risks of leaving without further workup because of the paresthesia. We discussed these risks as disability, progression of disease and death. The patient understands these risks and says that he would rather follow up with his neurologist in the office. He understands that he may return at any time. Will be discharged home.  Clinical Course     ____________________________________________   FINAL CLINICAL IMPRESSION(S) / ED DIAGNOSES  Final diagnoses:  Seizure (HCC)  Paresthesia      NEW MEDICATIONS STARTED DURING THIS VISIT:  Discharge Medication List as of 07/07/2016 11:36 AM       Note:  This document was prepared using Dragon voice recognition software and may include unintentional dictation errors.    Myrna Blazeravid Matthew Darryle Dennie,  MD 07/07/16 [phone]

## 2016-07-07 NOTE — ED Notes (Signed)
MD explained to pt risks of leaving AMA and refusing further evaluation and treatment. Pt still wishes to leave AMA. Pt is A&Ox4, no confusion noted. Pt is not driving and has called his cousin to come and pick him up.

## 2016-07-07 NOTE — ED Triage Notes (Signed)
Pt presents to ED c/o grand mal seizure lasting 1 min per EMS, pt was post ictal on their arrival. Pt was sitting on couch and slid to floor, witnessed by family. CBG 98, endorses drinking liquor and beer last night. Is supposed to be taking keppra and dilantin, states he last took them yesterday. C/o pain and limited ROM to L shoulder. Pt is A&Ox4 on arrival to ED. Denies incontinence, denies biting tongue.

## 2016-07-07 NOTE — ED Notes (Signed)
Pt refusing to be stuck for IV or blood draw. Pt states he wants to speak to MD first.

## 2016-07-07 NOTE — ED Notes (Signed)
MD at bedside. 

## 2016-07-08 ENCOUNTER — Emergency Department: Payer: Medicaid Other

## 2016-07-08 ENCOUNTER — Emergency Department
Admission: EM | Admit: 2016-07-08 | Discharge: 2016-07-08 | Disposition: A | Discharge status: Home / Self Care | Payer: Medicaid Other | Attending: Emergency Medicine | Admitting: Emergency Medicine

## 2016-07-08 ENCOUNTER — Encounter: Payer: Self-pay | Admitting: Emergency Medicine

## 2016-07-08 DIAGNOSIS — Y999 Unspecified external cause status: Secondary | ICD-10-CM | POA: Insufficient documentation

## 2016-07-08 DIAGNOSIS — R0602 Shortness of breath: Secondary | ICD-10-CM

## 2016-07-08 DIAGNOSIS — Y939 Activity, unspecified: Secondary | ICD-10-CM | POA: Diagnosis not present

## 2016-07-08 DIAGNOSIS — S43015A Anterior dislocation of left humerus, initial encounter: Secondary | ICD-10-CM | POA: Diagnosis not present

## 2016-07-08 DIAGNOSIS — Y929 Unspecified place or not applicable: Secondary | ICD-10-CM | POA: Diagnosis not present

## 2016-07-08 DIAGNOSIS — X58XXXA Exposure to other specified factors, initial encounter: Secondary | ICD-10-CM | POA: Insufficient documentation

## 2016-07-08 DIAGNOSIS — S42252A Displaced fracture of greater tuberosity of left humerus, initial encounter for closed fracture: Secondary | ICD-10-CM | POA: Diagnosis not present

## 2016-07-08 DIAGNOSIS — R42 Dizziness and giddiness: Secondary | ICD-10-CM | POA: Diagnosis not present

## 2016-07-08 DIAGNOSIS — S4992XA Unspecified injury of left shoulder and upper arm, initial encounter: Secondary | ICD-10-CM | POA: Diagnosis present

## 2016-07-08 DIAGNOSIS — Z87891 Personal history of nicotine dependence: Secondary | ICD-10-CM | POA: Insufficient documentation

## 2016-07-08 DIAGNOSIS — R079 Chest pain, unspecified: Secondary | ICD-10-CM

## 2016-07-08 DIAGNOSIS — S43005A Unspecified dislocation of left shoulder joint, initial encounter: Secondary | ICD-10-CM

## 2016-07-08 LAB — COMPREHENSIVE METABOLIC PANEL
ALK PHOS: 41 U/L (ref 38–126)
ALT: 15 U/L — AB (ref 17–63)
AST: 26 U/L (ref 15–41)
Albumin: 4.4 g/dL (ref 3.5–5.0)
Anion gap: 8 (ref 5–15)
BILIRUBIN TOTAL: 0.5 mg/dL (ref 0.3–1.2)
BUN: 8 mg/dL (ref 6–20)
CALCIUM: 8.8 mg/dL — AB (ref 8.9–10.3)
CO2: 24 mmol/L (ref 22–32)
CREATININE: 1.04 mg/dL (ref 0.61–1.24)
Chloride: 106 mmol/L (ref 101–111)
GFR calc Af Amer: >60 mL/min (ref >60)
Glucose, Bld: 117 mg/dL — ABNORMAL HIGH (ref 65–99)
POTASSIUM: 3.6 mmol/L (ref 3.5–5.1)
Sodium: 138 mmol/L (ref 135–145)
TOTAL PROTEIN: 7.3 g/dL (ref 6.5–8.1)

## 2016-07-08 LAB — CBC
HEMATOCRIT: 36.8 % — AB (ref 40.0–52.0)
HEMOGLOBIN: 13 g/dL (ref 13.0–18.0)
MCH: 30.3 pg (ref 26.0–34.0)
MCHC: 35.4 g/dL (ref 32.0–36.0)
MCV: 85.5 fL (ref 80.0–100.0)
Platelets: 224 10*3/uL (ref 150–440)
RBC: 4.3 MIL/uL — ABNORMAL LOW (ref 4.40–5.90)
RDW: 13.2 % (ref 11.5–14.5)
WBC: 8.1 10*3/uL (ref 3.8–10.6)

## 2016-07-08 LAB — TROPONIN I

## 2016-07-08 LAB — ETHANOL: Alcohol, Ethyl (B): <5 mg/dL (ref <5)

## 2016-07-08 MED ORDER — SODIUM CHLORIDE 0.9 % IV BOLUS (SEPSIS)
1000.0000 mL | Freq: Once | INTRAVENOUS | Status: AC
  Administered 2016-07-08: 1000 mL via INTRAVENOUS

## 2016-07-08 MED ORDER — OXYCODONE-ACETAMINOPHEN 5-325 MG PO TABS
2.0000 | ORAL_TABLET | Freq: Once | ORAL | Status: AC
  Administered 2016-07-08: 2 via ORAL
  Filled 2016-07-08: qty 2

## 2016-07-08 MED ORDER — OXYCODONE-ACETAMINOPHEN 5-325 MG PO TABS: 1.0000 | ORAL_TABLET | Freq: Four times a day (QID) | ORAL | 0 refills | Status: DC | PRN

## 2016-07-08 MED ORDER — SODIUM CHLORIDE 0.9 % IV SOLN
1000.0000 mg | Freq: Once | INTRAVENOUS | Status: AC
  Administered 2016-07-08: 1000 mg via INTRAVENOUS
  Filled 2016-07-08: qty 10

## 2016-07-08 MED ORDER — ETOMIDATE 2 MG/ML IV SOLN
INTRAVENOUS | Status: AC
  Filled 2016-07-08: qty 10

## 2016-07-08 MED ORDER — MORPHINE SULFATE (PF) 2 MG/ML IV SOLN
4.0000 mg | Freq: Once | INTRAVENOUS | Status: AC
  Administered 2016-07-08: 4 mg via INTRAVENOUS
  Filled 2016-07-08: qty 2

## 2016-07-08 MED ORDER — ONDANSETRON HCL 4 MG/2ML IJ SOLN
4.0000 mg | Freq: Once | INTRAMUSCULAR | Status: AC
  Administered 2016-07-08: 4 mg via INTRAVENOUS
  Filled 2016-07-08: qty 2

## 2016-07-08 MED ORDER — ETOMIDATE 2 MG/ML IV SOLN
INTRAVENOUS | Status: DC | PRN
  Administered 2016-07-08: 7.5 mg via INTRAVENOUS
  Administered 2016-07-08: 3.75 mg via INTRAVENOUS

## 2016-07-08 NOTE — ED Provider Notes (Signed)
[facility] Emergency Department Provider Note   ____________________________________________   First MD Initiated Contact with Patient 07/08/16 0255     (approximate)  I have reviewed the triage vital signs and the nursing notes.   HISTORY  Chief Complaint Chest Pain (Pt. states chest pain that radiates down lt. arm.); Arm Pain (Pt. states pain to lt. arm); and Seizures (Pt. states he was seen here yesterday.)    HPI Keith Chung. is a 35 y.o. male who comes into the hospital today with multiple complaints. The patient reports that his left arm feels like dead weight and he's having pain in his chest. The patient reports that he had a seizure this morning. He is unsure how long it lasted he just woke up with the EMS over him. The patient denies missing any doses of medications. He reports that the chest pain and arm heaviness started this morning. The patient took some Tylenol for his symptoms. He does have some headache, dizziness, shortness of breath and he is seeing spots. He endorses nausea and vomiting 2. She is also felt some sweatiness. He reports that he had similar symptoms about 1-2 months ago was found on the side of the road after having a seizure. The patient is concerned he may be getting ready to have another seizure. The patient is here for evaluation.   Past Medical History:  Diagnosis Date  . Learning disorder   . Seasonal allergies   . Seizures [facility]     Patient Active Problem List   Diagnosis Date Noted  . 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 Seizure (HCC) 04/18/2016  . Epilepsy (HCC) 10/17/2015    Past Surgical History:  Procedure Laterality Date  . HAND SURGERY     right hand surgery  . VASECTOMY Bilateral     Prior to Admission medications   Medication Sig Start Date End Date Taking? Authorizing Provider  divalproex (DEPAKOTE) 500 MG DR tablet Take 1 tablet (500 mg total) by mouth 3 (three) times daily. 05/13/16    Houston Siren, MD  lamoTRIgine (LAMICTAL) 100 MG tablet Take 100 mg by mouth 2 (two) times daily. 06/29/16 07/30/16  Historical Provider, MD  levETIRAcetam (KEPPRA) 500 MG tablet Take 1500 mg in am and 1500 mg in pm. 05/08/16   Levert Feinstein, MD  oxyCODONE-acetaminophen (ROXICET) 5-325 MG tablet Take 1 tablet by mouth every 6 (six) hours as needed. 07/08/16   Rebecka Apley, MD    Allergies Pollen extract  Family History  Problem Relation Age of Onset  . Cancer Father     Unsure of type  . Hypertension Father   . Hypertension Mother   . Diabetes Mellitus II Maternal Grandmother     Social History Social History  Substance Use Topics  . Smoking status: Former Smoker    Packs/day: 0.30    Types: Cigarettes    Quit date: 07/18/2014  . Smokeless tobacco: Never Used     Comment: stopped 4 months ago  . Alcohol use No     Comment: socially    Review of Systems Constitutional: No fever/chills Eyes: Visual scotomata ENT: No sore throat. Cardiovascular:  chest pain. Respiratory:  shortness of breath. Gastrointestinal: nausea,  vomiting.  No diarrhea.  No constipation. Genitourinary: Negative for dysuria. Musculoskeletal: Negative for back pain. Skin: Negative for rash. Neurological: Headache, seizure, left arm heaviness  10-point ROS otherwise negative.  ____________________________________________   PHYSICAL EXAM:  VITAL SIGNS: ED Triage Vitals  Enc  Vitals Group     BP 07/08/16 0302 (!) 154/101     Pulse Rate 07/08/16 0302 78     Resp 07/08/16 0302 16     Temp 07/08/16 0302 98.5 F (36.9 C)     Temp src --      SpO2 07/08/16 0254 98 %     Weight 07/08/16 0303 165 lb (74.8 kg)     Height 07/08/16 0303 6\' 2"  (1.88 m)     Head Circumference --      Peak Flow --      Pain Score 07/08/16 0303 10     Pain Loc --      Pain Edu? --      Excl. in GC? --     Constitutional: Alert and oriented. Well appearing and in no acute distress. Eyes: Conjunctivae are normal.  PERRL. EOMI. Head: Atraumatic. Nose: No congestion/rhinnorhea. Mouth/Throat: Mucous membranes are moist.  Oropharynx non-erythematous. Cardiovascular: Normal rate, regular rhythm. Grossly normal heart sounds.  Good peripheral circulation. Respiratory: Normal respiratory effort.  No retractions. Lungs CTAB. Chest tender to palpation Gastrointestinal: Soft and nontender. No distention. Positive bowel sounds Musculoskeletal: No lower extremity tenderness nor edema.   Neurologic:  Normal speech and language. Cranial nerves II through XII are grossly intact. Patient able to squeeze with left upper extremity but he has pain with trying to move his arm. Skin:  Skin is warm, dry and intact.  Psychiatric: Mood and affect are normal.   ____________________________________________   LABS (all labs ordered are listed, but only abnormal results are displayed)  Labs Reviewed  CBC - Abnormal; Notable for the following:       Result Value   RBC 4.30 (*)    HCT 36.8 (*)    All other components within normal limits  COMPREHENSIVE METABOLIC PANEL - Abnormal; Notable for the following:    Glucose, Bld 117 (*)    Calcium 8.8 (*)    ALT 15 (*)    All other components within normal limits  TROPONIN I  ETHANOL  URINALYSIS COMPLETEWITH MICROSCOPIC ([facility] ONLY)  URINE DRUG SCREEN, QUALITATIVE ([facility] ONLY)  VALPROIC ACID LEVEL   ____________________________________________  EKG  ED ECG REPORT I, Rebecka ApleyWebster,  Vonn Sliger P, the attending physician, personally viewed and interpreted this ECG.   Date: 07/08/2016  EKG Time: 257  Rate: 70  Rhythm: normal sinus rhythm  Axis: none  Intervals:none  ST&T Change: none  ____________________________________________  RADIOLOGY  CT head CXR ____________________________________________   PROCEDURES  Procedure(s) performed: None  Reduction of dislocation Date/Time: 07/08/2016 5:00 AM Performed by: Rebecka ApleyWEBSTER, Lelaina Oatis P Authorized by: Rebecka ApleyWEBSTER, Jenee Spaugh P    Consent: Verbal consent obtained. Consent given by: patient Patient understanding: patient states understanding of the procedure being performed Patient consent: the patient's understanding of the procedure matches consent given Imaging studies: imaging studies available Patient identity confirmed: verbally with patient Time out: Immediately prior to procedure a "time out" was called to verify the correct patient, procedure, equipment, support staff and site/side marked as required. Local anesthesia used: no  Anesthesia: Local anesthesia used: no  Sedation: Patient sedated: yes Sedation type: moderate (conscious) sedation Sedatives: etomidate Sedation start date/time: 07/08/2016 5:00 AM Patient tolerance: Patient tolerated the procedure well with no immediate complications Comments: Left shoulder dislocation reduced using traction/countertraction, immobilized immediately after procedure     Critical Care performed: No  ____________________________________________   INITIAL IMPRESSION / ASSESSMENT AND PLAN / ED COURSE  Pertinent labs & imaging results that were available during  my care of the patient were reviewed by me and considered in my medical decision making (see chart for details).  This is a 35 year old male who comes into the hospital today with chest pain, arm pain and heaviness and a seizure. I will check some blood work as well as CT. I will also reevaluate the patient's previous note and reassess the patient.  Clinical Course  Value Comment By Time  CT Head Wo Contrast No acute intracranial pathology. Rebecka Apley, MD 10/22 [phone]  DG Shoulder Left Anterior dislocation of the left shoulder with displaced fracture of the greater tubercle of the humeral head.   Rebecka Apley, MD 10/22 [phone]  DG Shoulder Left Portable Interval reduction of the previously seen left shoulder dislocation now appears in anatomic alignment.  Mildly displaced fracture of the  greater tubercle.   Rebecka Apley, MD 10/22 [phone]  DG Chest 1 View No acute cardiopulmonary process.  Anterior dislocation of the left shoulder with displaced fracture of the left humeral head.   Rebecka Apley, MD 10/22 [phone]    The patient still had some left shoulder pain after the procedure was performed but his repeat x-ray shows a reduction of his dislocation. The patient's dislocation as the cause of his pain at this time. We put the patient in a shoulder immobilizer. We also give the patient a dose of Keppra 1000mg  IV. The patient will be discharged home to follow-up with orthopedic surgery. ____________________________________________   FINAL CLINICAL IMPRESSION(S) / ED DIAGNOSES  Final diagnoses:  Dislocation of left shoulder joint, initial encounter  Closed displaced fracture of greater tuberosity of left humerus, initial encounter      NEW MEDICATIONS STARTED DURING THIS VISIT:  New Prescriptions   OXYCODONE-ACETAMINOPHEN (ROXICET) 5-325 MG TABLET    Take 1 tablet by mouth every 6 (six) hours as needed.     Note:  This document was prepared using Dragon voice recognition software and may include unintentional dictation errors.    Rebecka Apley, MD 07/08/16 [phone]

## 2016-07-08 NOTE — Sedation Documentation (Signed)
Portable 1 view of left shoulder completed

## 2016-07-08 NOTE — ED Notes (Signed)
Pt. States he was here yesterday for seizures.  Pt. States hx of epilepsy.

## 2016-07-08 NOTE — ED Triage Notes (Signed)
Pt. States he has epilepsy, pt. States he was here yesterday for seizure.  Pt. States tonight chest pain that radiates to pain in lt. Arm.

## 2016-07-08 NOTE — ED Notes (Signed)
Called patient mother to see if she could come pick patient up, per mother she will be here to pick him up "shortly."

## 2016-07-08 NOTE — ED Notes (Signed)
Patient transported to CT 

## 2016-07-11 ENCOUNTER — Emergency Department: Payer: Medicaid Other

## 2016-07-11 ENCOUNTER — Encounter: Payer: Self-pay | Admitting: Emergency Medicine

## 2016-07-11 ENCOUNTER — Emergency Department
Admission: EM | Admit: 2016-07-11 | Discharge: 2016-07-11 | Disposition: A | Discharge status: Home / Self Care | Payer: Medicaid Other | Attending: Emergency Medicine | Admitting: Emergency Medicine

## 2016-07-11 DIAGNOSIS — S43005A Unspecified dislocation of left shoulder joint, initial encounter: Secondary | ICD-10-CM | POA: Insufficient documentation

## 2016-07-11 DIAGNOSIS — S4292XA Fracture of left shoulder girdle, part unspecified, initial encounter for closed fracture: Secondary | ICD-10-CM

## 2016-07-11 DIAGNOSIS — Y929 Unspecified place or not applicable: Secondary | ICD-10-CM | POA: Diagnosis not present

## 2016-07-11 DIAGNOSIS — S42252A Displaced fracture of greater tuberosity of left humerus, initial encounter for closed fracture: Secondary | ICD-10-CM | POA: Diagnosis not present

## 2016-07-11 DIAGNOSIS — Z87891 Personal history of nicotine dependence: Secondary | ICD-10-CM | POA: Insufficient documentation

## 2016-07-11 DIAGNOSIS — X58XXXA Exposure to other specified factors, initial encounter: Secondary | ICD-10-CM | POA: Insufficient documentation

## 2016-07-11 DIAGNOSIS — Z79899 Other long term (current) drug therapy: Secondary | ICD-10-CM | POA: Diagnosis not present

## 2016-07-11 DIAGNOSIS — S43432A Superior glenoid labrum lesion of left shoulder, initial encounter: Secondary | ICD-10-CM | POA: Insufficient documentation

## 2016-07-11 DIAGNOSIS — Y999 Unspecified external cause status: Secondary | ICD-10-CM | POA: Insufficient documentation

## 2016-07-11 DIAGNOSIS — S43492A Other sprain of left shoulder joint, initial encounter: Secondary | ICD-10-CM

## 2016-07-11 DIAGNOSIS — S4992XA Unspecified injury of left shoulder and upper arm, initial encounter: Secondary | ICD-10-CM | POA: Diagnosis present

## 2016-07-11 DIAGNOSIS — Y9389 Activity, other specified: Secondary | ICD-10-CM | POA: Insufficient documentation

## 2016-07-11 LAB — CBC WITH DIFFERENTIAL/PLATELET
BASOS ABS: 0 10*3/uL (ref 0–0.1)
BASOS PCT: 1 %
Eosinophils Absolute: 0.2 10*3/uL (ref 0–0.7)
Eosinophils Relative: 5 %
HEMATOCRIT: 37.7 % — AB (ref 40.0–52.0)
Hemoglobin: 13.2 g/dL (ref 13.0–18.0)
LYMPHS PCT: 23 %
Lymphs Abs: 0.8 10*3/uL — ABNORMAL LOW (ref 1.0–3.6)
MCH: 29.8 pg (ref 26.0–34.0)
MCHC: 34.8 g/dL (ref 32.0–36.0)
MCV: 85.6 fL (ref 80.0–100.0)
Monocytes Absolute: 0.3 10*3/uL (ref 0.2–1.0)
Monocytes Relative: 10 %
NEUTROS ABS: 2.1 10*3/uL (ref 1.4–6.5)
Neutrophils Relative %: 61 %
PLATELETS: 259 10*3/uL (ref 150–440)
RBC: 4.41 MIL/uL (ref 4.40–5.90)
RDW: 13.2 % (ref 11.5–14.5)
WBC: 3.4 10*3/uL — AB (ref 3.8–10.6)

## 2016-07-11 LAB — BASIC METABOLIC PANEL
ANION GAP: 7 (ref 5–15)
BUN: 12 mg/dL (ref 6–20)
CALCIUM: 9.1 mg/dL (ref 8.9–10.3)
CO2: 29 mmol/L (ref 22–32)
Chloride: 103 mmol/L (ref 101–111)
Creatinine, Ser: 1.09 mg/dL (ref 0.61–1.24)
GLUCOSE: 100 mg/dL — AB (ref 65–99)
POTASSIUM: 3.8 mmol/L (ref 3.5–5.1)
Sodium: 139 mmol/L (ref 135–145)

## 2016-07-11 LAB — VALPROIC ACID LEVEL: VALPROIC ACID LVL: 22 ug/mL — AB (ref 50.0–100.0)

## 2016-07-11 MED ORDER — SODIUM CHLORIDE 0.9 % IV BOLUS (SEPSIS)
1000.0000 mL | Freq: Once | INTRAVENOUS | Status: AC
  Administered 2016-07-11: 1000 mL via INTRAVENOUS

## 2016-07-11 MED ORDER — ETOMIDATE 2 MG/ML IV SOLN
10.0000 mg | Freq: Once | INTRAVENOUS | Status: AC
  Administered 2016-07-11: 10 mg via INTRAVENOUS
  Filled 2016-07-11: qty 10

## 2016-07-11 NOTE — ED Provider Notes (Signed)
[facility] Emergency Department Provider Note  ____________________________________________   First MD Initiated Contact with Patient 07/11/16 1150     (approximate)  I have reviewed the triage vital signs and the nursing notes.   HISTORY  Chief Complaint Shoulder Pain   HPI Keith Chung. is a 35 y.o. male with a history of a seizure disorder with a recently dislocated left shoulder. The patient said he had 2 seizures this past Sunday. I personally saw him and he left AMA and then returned later in the day for definitive treatment of the left shoulder. He says that up until this morning of the shoulder had remained in place but that he took off the shoulder immobilizer and as of 30 minutes ago, was not wearing the immobilizer, his shoulder pain began again. He says that he has slight numbness to left upper extremity in addition to the pain to the left shoulder. Pain worsens with movement.Denies any further seizures since Sunday. Had seen his neurologist yesterday who increased his dose of Lamictal) 25 mg each week and to take 125 mg twice day. Patient says that he has been compliant with his medications. Patient says that he has had nothing to eat since yesterday. Has just taken his meds this morning at about 8 AM.   Past Medical History:  Diagnosis Date  . Learning disorder   . Seasonal allergies   . Seizures [facility])     Patient Active Problem List   Diagnosis Date Noted  . 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 Seizure (HCC) 04/18/2016  . Epilepsy (HCC) 10/17/2015    Past Surgical History:  Procedure Laterality Date  . HAND SURGERY     right hand surgery  . VASECTOMY Bilateral     Prior to Admission medications   Medication Sig Start Date End Date Taking? Authorizing Provider  divalproex (DEPAKOTE) 500 MG DR tablet Take 1 tablet (500 mg total) by mouth 3 (three) times daily. 05/13/16   Houston Siren, MD  lamoTRIgine (LAMICTAL) 100 MG  tablet Take 100 mg by mouth 2 (two) times daily. 06/29/16 07/30/16  Historical Provider, MD  levETIRAcetam (KEPPRA) 500 MG tablet Take 1500 mg in am and 1500 mg in pm. 05/08/16   Levert Feinstein, MD  oxyCODONE-acetaminophen (ROXICET) 5-325 MG tablet Take 1 tablet by mouth every 6 (six) hours as needed. 07/08/16   Rebecka Apley, MD    Allergies Pollen extract  Family History  Problem Relation Age of Onset  . Cancer Father     Unsure of type  . Hypertension Father   . Hypertension Mother   . Diabetes Mellitus II Maternal Grandmother     Social History Social History  Substance Use Topics  . Smoking status: Former Smoker    Packs/day: 0.30    Types: Cigarettes    Quit date: 07/18/2014  . Smokeless tobacco: Never Used     Comment: stopped 4 months ago  . Alcohol use No     Comment: socially    Review of Systems Constitutional: No fever/chills Eyes: No visual changes. ENT: No sore throat. Cardiovascular: Denies chest pain. Respiratory: Denies shortness of breath. Gastrointestinal: No abdominal pain.  No nausea, no vomiting.  No diarrhea.  No constipation. Genitourinary: Negative for dysuria. Musculoskeletal: Negative for back pain. Skin: Negative for rash. Neurological: Negative for headaches, focal weakness or numbness.  10-point ROS otherwise negative.  ____________________________________________   PHYSICAL EXAM:  VITAL SIGNS: ED Triage Vitals  Enc Vitals Group  BP 07/11/16 1153 (!) 162/72     Pulse Rate 07/11/16 1153 68     Resp 07/11/16 1153 18     Temp 07/11/16 1153 97.9 F (36.6 C)     Temp Source 07/11/16 1153 Oral     SpO2 07/11/16 1153 100 %     Weight 07/11/16 1154 165 lb (74.8 kg)     Height 07/11/16 1154 6\' 2"  (1.88 m)     Head Circumference --      Peak Flow --      Pain Score 07/11/16 1154 10     Pain Loc --      Pain Edu? --      Excl. in GC? --     Constitutional: Alert and oriented. Well appearing and in no acute distress. Eyes:  Conjunctivae are normal. PERRL. EOMI. Head: Atraumatic. Nose: No congestion/rhinnorhea. Mouth/Throat: Mucous membranes are moist.   Neck: No stridor.   Cardiovascular: Normal rate, regular rhythm. Grossly normal heart sounds.   Respiratory: Normal respiratory effort.  No retractions. Lungs CTAB. Gastrointestinal: Soft and nontender. No distention. No abdominal bruits. No CVA tenderness. Musculoskeletal: No lower extremity tenderness nor edema.  No joint effusions.  Left shoulder with deformity consistent with dislocation. No tenderness to palpation but with extension of the left arm and gentle rocking and gradual abduction and attempt to reduce his shoulder the patient began having shoulder pain. Neurologic:  Normal speech and language. No gross focal neurologic deficits are appreciated. No gait instability. Skin:  Skin is warm, dry and intact. No rash noted. Psychiatric: Mood and affect are normal. Speech and behavior are normal.  ____________________________________________   LABS (all labs ordered are listed, but only abnormal results are displayed)  Labs Reviewed  VALPROIC ACID LEVEL - Abnormal; Notable for the following:       Result Value   Valproic Acid Lvl 22 (*)    All other components within normal limits  CBC WITH DIFFERENTIAL/PLATELET - Abnormal; Notable for the following:    WBC 3.4 (*)    HCT 37.7 (*)    Lymphs Abs 0.8 (*)    All other components within normal limits  BASIC METABOLIC PANEL - Abnormal; Notable for the following:    Glucose, Bld 100 (*)    All other components within normal limits   ____________________________________________  EKG   ____________________________________________  RADIOLOGY  DG Shoulder Left (Accession [id number]) (Order [id number])  Imaging  Date: 07/11/2016 Department: [facility] EMERGENCY [facility]PARTMENT Released By/Authorizing: Myrna Blazeravid Matthew Schaevitz, MD (auto-released)  Exam Information   Status Exam Begun   Exam Ended   Final [99] 07/11/2016 12:47 PM 07/11/2016 1:00 PM  PACS Images   Show images for DG Shoulder Left  Study Result   CLINICAL DATA:  Shoulder pain.  EXAM: LEFT SHOULDER - 2+ VIEW  COMPARISON:  Three days ago  FINDINGS: Anterior glenohumeral dislocation. Subacute greater tuberosity fracture with similar mild inferior displacement. Crescentic bone fragment overlapping the lateral scapula, suspected bony Bankart. Negative acromioclavicular joint.  IMPRESSION: 1. Anterior glenohumeral dislocation. 2. Greater tuberosity fracture with similar displacement to 3 days ago. 3. Possible displaced bony Bankart fragment.   Electronically Signed   By: Marnee SpringJonathon  Watts M.D.   On: 07/11/2016 13:15    ____________________________________________   PROCEDURES  Procedure(s) performed:   Reduction of dislocation Date/Time: 1:58 PM Performed by: Arelia LongestSchaevitz,  David M Authorized by: Arelia LongestSchaevitz,  David M Consent: Verbal consent obtained. Risks and benefits: risks, benefits and alternatives were discussed Consent given  by: patient Required items: required blood products, implants, devices, and special equipment available Time out: Immediately prior to procedure a "time out" was called to verify the correct patient, procedure, equipment, support staff and site/side marked as required.  Patient sedated: etomidate  Vitals: Vital signs were monitored during sedation. Patient tolerance: Patient tolerated the procedure well with no immediate complications.Placed in shoulder immobilizer status post procedure.  Joint: left shoulder Reduction technique: modified [place]    Procedures  Critical Care performed:   ____________________________________________   INITIAL IMPRESSION / ASSESSMENT AND PLAN / ED COURSE  Pertinent labs & imaging results that were available during my care of the patient were reviewed by me and considered in my medical decision making (see chart  for details).  ----------------------------------------- 12:08 PM on 07/11/2016 -----------------------------------------  Risks and benefits explained of sedation as well as reduction of the left shoulder. Patient agrees.  Clinical Course   ----------------------------------------- 1:56 PM on 07/11/2016 -----------------------------------------  Patient tolerated the reduction well. Neurovascularly intact status post this procedure. No numbness over the left lateral deltoids. Intact radial pulse. 5 out of 5 strength to the distal left upper extremity.  Patient counseled to not take off his immobilizer and to leave it in place until he is seen by the orthopedics at his scheduled appointment next week. He says that he has called already to schedule an appointment after his last visit. Furthermore, he was seen by his neurologist yesterday who increased his dose of Lamictal. He has subtherapeutic on his Depakote today but he has not had any seizures since Sunday and since he has seen his neurologist since then and did not think it is appropriate to adjust his meds at this time. He is understanding of the plan and willing to comply.  Patient aware of fractures to the left shoulder as well.  Pending postreduction films. If satisfactory, the patient will be discharged home.  ____________________________________________   FINAL CLINICAL IMPRESSION(S) / ED DIAGNOSES  Left shoulder dislocation with reduction. Fracture to the left shoulder.    NEW MEDICATIONS STARTED DURING THIS VISIT:  New Prescriptions   No medications on file     Note:  This document was prepared using Dragon voice recognition software and may include unintentional dictation errors.    Myrna Blazer, MD 07/11/16 [phone]

## 2016-07-11 NOTE — ED Triage Notes (Signed)
Pt arrived via EMS from home for reports of possible left shoulder dislocation. Pt was recently treated for left shoulder dislocation. Pt states he slept without his shoulder immobilizer last night and awoke this morning in severe pain and with numbness and tingling to left arm. Denies one seizure since Sunday. EMS reports VSS.

## 2016-09-17 ENCOUNTER — Emergency Department
Admission: EM | Admit: 2016-09-17 | Discharge: 2016-09-17 | Disposition: A | Discharge status: Home / Self Care | Payer: Medicaid Other | Attending: Emergency Medicine | Admitting: Emergency Medicine

## 2016-09-17 ENCOUNTER — Encounter: Payer: Self-pay | Admitting: Medical Oncology

## 2016-09-17 DIAGNOSIS — Z79899 Other long term (current) drug therapy: Secondary | ICD-10-CM | POA: Diagnosis not present

## 2016-09-17 DIAGNOSIS — K2900 Acute gastritis without bleeding: Secondary | ICD-10-CM | POA: Insufficient documentation

## 2016-09-17 DIAGNOSIS — Z87891 Personal history of nicotine dependence: Secondary | ICD-10-CM | POA: Insufficient documentation

## 2016-09-17 DIAGNOSIS — G40909 Epilepsy, unspecified, not intractable, without status epilepticus: Secondary | ICD-10-CM

## 2016-09-17 DIAGNOSIS — R569 Unspecified convulsions: Secondary | ICD-10-CM | POA: Diagnosis present

## 2016-09-17 DIAGNOSIS — Z791 Long term (current) use of non-steroidal anti-inflammatories (NSAID): Secondary | ICD-10-CM | POA: Insufficient documentation

## 2016-09-17 LAB — CBC WITH DIFFERENTIAL/PLATELET
BASOS PCT: 1 %
Basophils Absolute: 0 10*3/uL (ref 0–0.1)
Eosinophils Absolute: 0.2 10*3/uL (ref 0–0.7)
Eosinophils Relative: 7 %
HEMATOCRIT: 40.4 % (ref 40.0–52.0)
HEMOGLOBIN: 14.4 g/dL (ref 13.0–18.0)
LYMPHS PCT: 44 %
Lymphs Abs: 1.3 10*3/uL (ref 1.0–3.6)
MCH: 30.4 pg (ref 26.0–34.0)
MCHC: 35.7 g/dL (ref 32.0–36.0)
MCV: 84.9 fL (ref 80.0–100.0)
Monocytes Absolute: 0.3 10*3/uL (ref 0.2–1.0)
Monocytes Relative: 12 %
NEUTROS ABS: 1 10*3/uL — AB (ref 1.4–6.5)
NEUTROS PCT: 36 %
Platelets: 244 10*3/uL (ref 150–440)
RBC: 4.76 MIL/uL (ref 4.40–5.90)
RDW: 11.8 % (ref 11.5–14.5)
WBC: 2.9 10*3/uL — ABNORMAL LOW (ref 3.8–10.6)

## 2016-09-17 LAB — BASIC METABOLIC PANEL
ANION GAP: 7 (ref 5–15)
BUN: 11 mg/dL (ref 6–20)
CALCIUM: 9.4 mg/dL (ref 8.9–10.3)
CHLORIDE: 103 mmol/L (ref 101–111)
CO2: 30 mmol/L (ref 22–32)
Creatinine, Ser: 1.34 mg/dL — ABNORMAL HIGH (ref 0.61–1.24)
GFR calc Af Amer: >60 mL/min (ref >60)
GFR calc non Af Amer: >60 mL/min (ref >60)
Glucose, Bld: 91 mg/dL (ref 65–99)
POTASSIUM: 4 mmol/L (ref 3.5–5.1)
Sodium: 140 mmol/L (ref 135–145)

## 2016-09-17 LAB — SALICYLATE LEVEL

## 2016-09-17 LAB — ETHANOL: Alcohol, Ethyl (B): <5 mg/dL (ref <5)

## 2016-09-17 LAB — ACETAMINOPHEN LEVEL

## 2016-09-17 MED ORDER — GI COCKTAIL ~~LOC~~
30.0000 mL | ORAL | Status: AC
  Administered 2016-09-17: 30 mL via ORAL
  Filled 2016-09-17: qty 30

## 2016-09-17 MED ORDER — FAMOTIDINE 20 MG PO TABS: 20.0000 mg | ORAL_TABLET | Freq: Two times a day (BID) | ORAL | 0 refills | Status: DC

## 2016-09-17 MED ORDER — FAMOTIDINE 20 MG PO TABS
40.0000 mg | ORAL_TABLET | Freq: Once | ORAL | Status: AC
  Administered 2016-09-17: 40 mg via ORAL
  Filled 2016-09-17: qty 2

## 2016-09-17 MED ORDER — SODIUM CHLORIDE 0.9 % IV BOLUS (SEPSIS)
1000.0000 mL | Freq: Once | INTRAVENOUS | Status: AC
  Administered 2016-09-17: 1000 mL via INTRAVENOUS

## 2016-09-17 NOTE — ED Triage Notes (Signed)
Per ems pt reports that he went to a bar last night and drank 2 mixed drinks, called [id number] he doesn't remember what happened after he got home, ems arrived and pt was face down on floor, reports he may have had a seizure, hx of seizures. Pt reports lower abd pain and has vomited x 1 this am.

## 2016-09-17 NOTE — Discharge Instructions (Signed)
Stop taking lamotrigine due to the rash that you're having. Continue taking Depakote and Keppra as prescribed. You should be taking at least Depakote 500 mg twice a day and Keppra 750 mg twice a day. Follow-up with Dr. Malvin JohnsPotter as soon as possible for further evaluation of the seizure disorder. Avoid drinking alcohol entirely.

## 2016-09-17 NOTE — ED Provider Notes (Signed)
[facility] Emergency Department Provider Note  ____________________________________________  Time seen: Approximately 8:10 AM  I have reviewed the triage vital signs and the nursing notes.   HISTORY  Chief Complaint Seizures (possible)    HPI Keith Chung. is a 36 y.o. male who comes to the ED by EMS due to possible seizure. He does not remember. He has a history of epilepsy. He reports that he takes Depakote and Keppra and Lamictal but sometimes misses doses because he doesn't like how they make him feel. He is concerned that he has an allergy to Lamictal because he had some bumps on his skin but no itching or throat swelling or shortness of breath.  He reports a last night he was drinking alcohol in celebration of the new year. He returned home but does not remember how he got home. When EMS arrived they found the patient on the floor. Patient currently complains of left-sided abdominal cramping, severe in intensity, nonradiating, no aggravating or alleviating factors, associated vomiting 1. No diarrhea or constipation.     Past Medical History:  Diagnosis Date  . Learning disorder   . Seasonal allergies   . Seizures [facility])      Patient Active Problem List   Diagnosis Date Noted  . 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 Seizure (HCC) 04/18/2016  . Epilepsy (HCC) 10/17/2015     Past Surgical History:  Procedure Laterality Date  . HAND SURGERY     right hand surgery  . VASECTOMY Bilateral      Prior to Admission medications   Medication Sig Start Date End Date Taking? Authorizing Provider  divalproex (DEPAKOTE) 500 MG DR tablet Take 1 tablet (500 mg total) by mouth 3 (three) times daily. Patient taking differently: Take 500 mg by mouth 2 (two) times daily.  05/13/16  Yes Houston Siren, MD  ibuprofen (ADVIL,MOTRIN) 600 MG tablet Take 600 mg by mouth every 6 (six) hours as needed.   Yes Historical Provider, MD  LamoTRIgine (LAMICTAL  PO) Take 1 tablet by mouth daily.   Yes Historical Provider, MD  levETIRAcetam (KEPPRA) 500 MG tablet Take 1500 mg in am and 1500 mg in pm. Patient taking differently: Take 500 mg by mouth 2 (two) times daily.  05/08/16  Yes Levert Feinstein, MD  famotidine (PEPCID) 20 MG tablet Take 1 tablet (20 mg total) by mouth 2 (two) times daily. 09/17/16   Sharman Cheek, MD  oxyCODONE-acetaminophen (ROXICET) 5-325 MG tablet Take 1 tablet by mouth every 6 (six) hours as needed. Patient not taking: Reported on 09/17/2016 07/08/16   Rebecka Apley, MD     Allergies Pollen extract   Family History  Problem Relation Age of Onset  . Cancer Father     Unsure of type  . Hypertension Father   . Hypertension Mother   . Diabetes Mellitus II Maternal Grandmother     Social History Social History  Substance Use Topics  . Smoking status: Former Smoker    Packs/day: 0.30    Types: Cigarettes    Quit date: 07/18/2014  . Smokeless tobacco: Never Used     Comment: stopped 4 months ago  . Alcohol use No     Comment: socially    Review of Systems  Constitutional:   No fever or chills.  ENT:   No sore throat. No rhinorrhea. Cardiovascular:   No chest pain. Respiratory:   No dyspnea or cough. Gastrointestinal:   Positive abdominal pain as above with vomiting.  Genitourinary:   Negative for dysuria or difficulty urinating. Musculoskeletal:   Negative for focal pain or swelling Neurological:   Negative for headaches.  10-point ROS otherwise negative.  ____________________________________________   PHYSICAL EXAM:  VITAL SIGNS: ED Triage Vitals  Enc Vitals Group     BP 09/17/16 0741 (!) 137/92     Pulse Rate 09/17/16 0741 75     Resp 09/17/16 0741 20     Temp 09/17/16 0741 97.3 F (36.3 C)     Temp Source 09/17/16 0741 Oral     SpO2 09/17/16 0741 100 %     Weight 09/17/16 0740 165 lb (74.8 kg)     Height 09/17/16 0740 6\' 2"  (1.88 m)     Head Circumference --      Peak Flow --      Pain Score  09/17/16 0740 9     Pain Loc --      Pain Edu? --      Excl. in GC? --     Vital signs reviewed, nursing assessments reviewed.   Constitutional:   Alert and oriented. Well appearing and in no distress. Eyes:   No scleral icterus. No conjunctival pallor. PERRL. EOMI.  No nystagmus. ENT   Head:   Normocephalic and atraumatic.   Nose:   No congestion/rhinnorhea. No septal hematoma   Mouth/Throat:   Dry mucous membranes, no pharyngeal erythema. No peritonsillar mass.    Neck:   No stridor. No SubQ emphysema. No meningismus. Hematological/Lymphatic/Immunilogical:   No cervical lymphadenopathy. Cardiovascular:   RRR. Symmetric bilateral radial and DP pulses.  No murmurs.  Respiratory:   Normal respiratory effort without tachypnea nor retractions. Breath sounds are clear and equal bilaterally. No wheezes/rales/rhonchi. Gastrointestinal:   Soft With left upper quadrant tenderness. Non distended. There is no CVA tenderness.  No rebound, rigidity, or guarding. Genitourinary:   deferred Musculoskeletal:   Nontender with normal range of motion in all extremities. No joint effusions.  No lower extremity tenderness.  No edema. Neurologic:   Normal speech and language.  CN 2-10 normal. Motor grossly intact. No gross focal neurologic deficits are appreciated.  Skin:    Skin is warm, dry and intact. No rash noted.  No petechiae, purpura, or bullae.  ____________________________________________    LABS (pertinent positives/negatives) (all labs ordered are listed, but only abnormal results are displayed) Labs Reviewed  BASIC METABOLIC PANEL - Abnormal; Notable for the following:       Result Value   Creatinine, Ser 1.34 (*)    All other components within normal limits  CBC WITH DIFFERENTIAL/PLATELET - Abnormal; Notable for the following:    WBC 2.9 (*)    Neutro Abs 1.0 (*)    All other components within normal limits  ACETAMINOPHEN LEVEL - Abnormal; Notable for the following:     Acetaminophen (Tylenol), Serum <10 (*)    All other components within normal limits  ETHANOL  SALICYLATE LEVEL   ____________________________________________   EKG  Interpreted by me Sinus rhythm rate of 80, normal axis and intervals. Benign early repolarization pattern in the anterior leads. Normal T waves, no acute ischemic changes.  ____________________________________________    RADIOLOGY    ____________________________________________   PROCEDURES Procedures  ____________________________________________   INITIAL IMPRESSION / ASSESSMENT AND PLAN / ED COURSE  Pertinent labs & imaging results that were available during my care of the patient were reviewed by me and considered in my medical decision making (see chart for details).  Patient is well-appearing no acute  distress, normal vital signs. Presents with complaint of possible seizure, without loss of bowel or bladder control or evidence of trauma. He may have had a seizure, which would be unsurprising due to his history of epilepsy, medication noncompliance, and recent alcohol use. I don't think this would indicate an acute medical issue at this time warrant further workup apart from checking labs to ensure his electrolytes and hydration status are acceptable. We will give IV fluids in the meantime.  His abdominal pain is nonsurgical and appears benign at this time based on history and examination.Considering the patient's symptoms, medical history, and physical examination today, I have low suspicion for cholecystitis or biliary pathology, pancreatitis, perforation or bowel obstruction, hernia, intra-abdominal abscess, AAA or dissection, volvulus or intussusception, mesenteric ischemia, or appendicitis. Most likely this is due to gastritis related to recent alcohol intake. We will try antacids and ensure he is tolerating oral intake.  Will refer patient back to neurology for concerns of Lamictal  intolerance.    ----------------------------------------- 9:41 AM on 09/17/2016 -----------------------------------------  Labs unremarkable. Vital signs remain unremarkable. Patient awake and alert and tolerating oral intake. He'll better after GI cocktail for gastritis. Counseled to stop Lamictal due to presence of rash, as noted in neurology clinic note recommendations.     Clinical Course    ____________________________________________   FINAL CLINICAL IMPRESSION(S) / ED DIAGNOSES  Final diagnoses:  Seizure disorder (HCC)  Acute gastritis without hemorrhage, unspecified gastritis type      New Prescriptions   FAMOTIDINE (PEPCID) 20 MG TABLET    Take 1 tablet (20 mg total) by mouth 2 (two) times daily.     Portions of this note were generated with dragon dictation software. Dictation errors may occur despite best attempts at proofreading.    Sharman Cheek, MD 09/17/16 [phone]

## 2016-09-26 ENCOUNTER — Emergency Department: Payer: Medicaid Other

## 2016-09-26 ENCOUNTER — Inpatient Hospital Stay
Admission: EM | Admit: 2016-09-26 | Discharge: 2016-09-30 | DRG: 093 | Discharge status: Against Medical Advice | Payer: Medicaid Other | Attending: Internal Medicine | Admitting: Internal Medicine

## 2016-09-26 DIAGNOSIS — Z79899 Other long term (current) drug therapy: Secondary | ICD-10-CM | POA: Diagnosis not present

## 2016-09-26 DIAGNOSIS — R29898 Other symptoms and signs involving the musculoskeletal system: Secondary | ICD-10-CM | POA: Diagnosis not present

## 2016-09-26 DIAGNOSIS — R569 Unspecified convulsions: Secondary | ICD-10-CM

## 2016-09-26 DIAGNOSIS — G928 Other toxic encephalopathy: Secondary | ICD-10-CM

## 2016-09-26 DIAGNOSIS — G40909 Epilepsy, unspecified, not intractable, without status epilepticus: Secondary | ICD-10-CM | POA: Diagnosis not present

## 2016-09-26 DIAGNOSIS — J302 Other seasonal allergic rhinitis: Secondary | ICD-10-CM | POA: Diagnosis present

## 2016-09-26 DIAGNOSIS — R32 Unspecified urinary incontinence: Secondary | ICD-10-CM | POA: Diagnosis not present

## 2016-09-26 DIAGNOSIS — F819 Developmental disorder of scholastic skills, unspecified: Secondary | ICD-10-CM | POA: Diagnosis present

## 2016-09-26 DIAGNOSIS — T426X5A Adverse effect of other antiepileptic and sedative-hypnotic drugs, initial encounter: Secondary | ICD-10-CM | POA: Diagnosis present

## 2016-09-26 DIAGNOSIS — Z87891 Personal history of nicotine dependence: Secondary | ICD-10-CM

## 2016-09-26 DIAGNOSIS — R4182 Altered mental status, unspecified: Secondary | ICD-10-CM | POA: Diagnosis not present

## 2016-09-26 DIAGNOSIS — R200 Anesthesia of skin: Secondary | ICD-10-CM | POA: Diagnosis present

## 2016-09-26 DIAGNOSIS — R531 Weakness: Secondary | ICD-10-CM | POA: Diagnosis present

## 2016-09-26 DIAGNOSIS — G92 Toxic encephalopathy: Secondary | ICD-10-CM | POA: Diagnosis not present

## 2016-09-26 DIAGNOSIS — D72819 Decreased white blood cell count, unspecified: Secondary | ICD-10-CM

## 2016-09-26 DIAGNOSIS — T426X1A Poisoning by other antiepileptic and sedative-hypnotic drugs, accidental (unintentional), initial encounter: Secondary | ICD-10-CM | POA: Diagnosis present

## 2016-09-26 DIAGNOSIS — G929 Unspecified toxic encephalopathy: Secondary | ICD-10-CM | POA: Diagnosis present

## 2016-09-26 LAB — CBC
HCT: 38.7 % — ABNORMAL LOW (ref 40.0–52.0)
Hemoglobin: 13.4 g/dL (ref 13.0–18.0)
MCH: 29.7 pg (ref 26.0–34.0)
MCHC: 34.6 g/dL (ref 32.0–36.0)
MCV: 85.8 fL (ref 80.0–100.0)
PLATELETS: 237 10*3/uL (ref 150–440)
RBC: 4.51 MIL/uL (ref 4.40–5.90)
RDW: 11.9 % (ref 11.5–14.5)
WBC: 3.2 10*3/uL — ABNORMAL LOW (ref 3.8–10.6)

## 2016-09-26 LAB — COMPREHENSIVE METABOLIC PANEL
ALT: 9 U/L — AB (ref 17–63)
ANION GAP: 6 (ref 5–15)
AST: 14 U/L — ABNORMAL LOW (ref 15–41)
Albumin: 4 g/dL (ref 3.5–5.0)
Alkaline Phosphatase: 45 U/L (ref 38–126)
BUN: 8 mg/dL (ref 6–20)
CHLORIDE: 104 mmol/L (ref 101–111)
CO2: 29 mmol/L (ref 22–32)
Calcium: 8.8 mg/dL — ABNORMAL LOW (ref 8.9–10.3)
Creatinine, Ser: 1.24 mg/dL (ref 0.61–1.24)
GFR calc non Af Amer: >60 mL/min (ref >60)
Glucose, Bld: 91 mg/dL (ref 65–99)
Potassium: 3.8 mmol/L (ref 3.5–5.1)
SODIUM: 139 mmol/L (ref 135–145)
Total Bilirubin: 0.4 mg/dL (ref 0.3–1.2)
Total Protein: 6.8 g/dL (ref 6.5–8.1)

## 2016-09-26 LAB — AMMONIA: AMMONIA: 58 umol/L — AB (ref 9–35)

## 2016-09-26 LAB — VALPROIC ACID LEVEL: VALPROIC ACID LVL: 108 ug/mL — AB (ref 50.0–100.0)

## 2016-09-26 LAB — ACETAMINOPHEN LEVEL

## 2016-09-26 LAB — ETHANOL

## 2016-09-26 LAB — SALICYLATE LEVEL: Salicylate Lvl: <7 mg/dL (ref 2.8–30.0)

## 2016-09-26 MED ORDER — LAMOTRIGINE 100 MG PO TABS
125.0000 mg | ORAL_TABLET | Freq: Two times a day (BID) | ORAL | Status: DC
  Administered 2016-09-26 – 2016-09-29 (×7): 125 mg via ORAL
  Filled 2016-09-26 (×8): qty 1

## 2016-09-26 MED ORDER — ONDANSETRON HCL 4 MG PO TABS: 4.0000 mg | ORAL_TABLET | Freq: Four times a day (QID) | ORAL | Status: DC | PRN

## 2016-09-26 MED ORDER — ENOXAPARIN SODIUM 40 MG/0.4ML ~~LOC~~ SOLN
40.0000 mg | SUBCUTANEOUS | Status: DC
  Administered 2016-09-26 – 2016-09-29 (×4): 40 mg via SUBCUTANEOUS
  Filled 2016-09-26 (×4): qty 0.4

## 2016-09-26 MED ORDER — SODIUM CHLORIDE 0.9 % IV BOLUS (SEPSIS)
1000.0000 mL | Freq: Once | INTRAVENOUS | Status: AC
  Administered 2016-09-26: 1000 mL via INTRAVENOUS

## 2016-09-26 MED ORDER — LAMOTRIGINE 5 MG PO CHEW: 25.0000 mg | CHEWABLE_TABLET | Freq: Two times a day (BID) | ORAL | Status: DC

## 2016-09-26 MED ORDER — LORAZEPAM 2 MG/ML IJ SOLN
2.0000 mg | INTRAMUSCULAR | Status: DC | PRN
  Administered 2016-09-28 – 2016-09-29 (×2): 2 mg via INTRAVENOUS
  Filled 2016-09-26 (×2): qty 1

## 2016-09-26 MED ORDER — LORAZEPAM 2 MG/ML IJ SOLN
2.0000 mg | Freq: Once | INTRAMUSCULAR | Status: AC
  Administered 2016-09-26: 2 mg via INTRAVENOUS

## 2016-09-26 MED ORDER — FAMOTIDINE 20 MG PO TABS
20.0000 mg | ORAL_TABLET | Freq: Two times a day (BID) | ORAL | Status: DC
  Administered 2016-09-26 – 2016-09-29 (×7): 20 mg via ORAL
  Filled 2016-09-26 (×7): qty 1

## 2016-09-26 MED ORDER — SODIUM CHLORIDE 0.9% FLUSH
3.0000 mL | Freq: Two times a day (BID) | INTRAVENOUS | Status: DC
  Administered 2016-09-26 – 2016-09-29 (×5): 3 mL via INTRAVENOUS

## 2016-09-26 MED ORDER — SODIUM CHLORIDE 0.9 % IV SOLN
INTRAVENOUS | Status: DC
  Administered 2016-09-26 – 2016-09-29 (×4): via INTRAVENOUS

## 2016-09-26 MED ORDER — ONDANSETRON HCL 4 MG/2ML IJ SOLN: 4.0000 mg | Freq: Four times a day (QID) | INTRAMUSCULAR | Status: DC | PRN

## 2016-09-26 MED ORDER — LEVETIRACETAM 500 MG PO TABS
500.0000 mg | ORAL_TABLET | Freq: Two times a day (BID) | ORAL | Status: DC
  Administered 2016-09-26 – 2016-09-27 (×2): 500 mg via ORAL
  Filled 2016-09-26 (×2): qty 1

## 2016-09-26 MED ORDER — LAMOTRIGINE 100 MG PO TABS: 100.0000 mg | ORAL_TABLET | Freq: Two times a day (BID) | ORAL | Status: DC

## 2016-09-26 NOTE — H&P (Signed)
[facility] [place] [facility]   PATIENT NAME: Keith Chung    MR#:  [id number]  DATE OF BIRTH:  [birth year]  DATE OF ADMISSION:  09/26/2016  PRIMARY CARE PHYSICIAN: Pcp Not In System   REQUESTING/REFERRING PHYSICIAN:   CHIEF COMPLAINT:   Chief Complaint  Patient presents with  . Seizures    HISTORY OF PRESENT ILLNESS: Keith Chung  is a 36 y.o. male with a known history of Seasonal allergies. Seizures, learning disorder, who presents to the hospital with altered mental status, very somnolent. Currently patient was found by police department in the hotel room. After someone called 911, and left, no further history was available, patient himself is altered and not able to provide review of systems, but admits OF thinking that he had seizure episodes. It has been that he felt suddenly very lightheaded and dizzy, and the was able to ambulate to the bathroom in bed and passed out. He thought that he had seizure. He thought that he also bit his tongue. His not able to provide any more history. On Police Department arrival, patient was given Narcan with no improvement and he was brought to emergency room for further evaluation and treatment, where he was given Ativan. He was still somnolent until 1 p.m., during my evaluation, he was able to open his eyes and briefly converses but not able to provide review of systems  PAST MEDICAL HISTORY:   Past Medical History:  Diagnosis Date  . Learning disorder   . Seasonal allergies   . Seizures (HCC)     PAST SURGICAL HISTORY: Past Surgical History:  Procedure Laterality Date  . HAND SURGERY     right hand surgery  . VASECTOMY Bilateral     SOCIAL HISTORY:  Social History  Substance Use Topics  . Smoking status: Former Smoker    Packs/day: 0.30    Types: Cigarettes    Quit date: 07/18/2014  . Smokeless tobacco: Never Used     Comment: stopped 4 months ago  . Alcohol use No     Comment: socially     FAMILY HISTORY:  Family History  Problem Relation Age of Onset  . Cancer Father     Unsure of type  . Hypertension Father   . Hypertension Mother   . Diabetes Mellitus II Maternal Grandmother     DRUG ALLERGIES:  Allergies  Allergen Reactions  . Pollen Extract Other (See Comments)    Runny nose, watery eyes, sneezing    Review of Systems  Unable to perform ROS: Medical condition    MEDICATIONS AT HOME:  Prior to Admission medications   Medication Sig Start Date End Date Taking? Authorizing Provider  lamoTRIgine (LAMICTAL) 100 MG tablet Take 100 mg by mouth 2 (two) times daily.   Yes Historical Provider, MD  divalproex (DEPAKOTE) 500 MG DR tablet Take 1 tablet (500 mg total) by mouth 3 (three) times daily. Patient taking differently: Take 500 mg by mouth 2 (two) times daily.  05/13/16   Houston Siren, MD  famotidine (PEPCID) 20 MG tablet Take 1 tablet (20 mg total) by mouth 2 (two) times daily. 09/17/16   Sharman Cheek, MD  ibuprofen (ADVIL,MOTRIN) 600 MG tablet Take 600 mg by mouth every 6 (six) hours as needed.    Historical Provider, MD  lamoTRIgine (LAMICTAL) 25 MG CHEW chewable tablet Chew 1 tablet by mouth 2 (two) times daily.     Historical Provider, MD  levETIRAcetam (KEPPRA) 500 MG tablet Take  1500 mg in am and 1500 mg in pm. Patient taking differently: Take 500 mg by mouth 2 (two) times daily.  05/08/16   Levert Feinstein, MD  oxyCODONE-acetaminophen (ROXICET) 5-325 MG tablet Take 1 tablet by mouth every 6 (six) hours as needed. Patient not taking: Reported on 09/17/2016 07/08/16   Rebecka Apley, MD      PHYSICAL EXAMINATION:   VITAL SIGNS: Blood pressure 127/83, pulse 60, temperature 97.4 F (36.3 C), temperature source Axillary, resp. rate 15, height 6\' 2"  (1.88 m), weight 74.8 kg (165 lb), SpO2 100 %.  GENERAL:  36 y.o.-year-old patient lying in the bed , Somnolent, in no acute distress. Intermittently yawning EYES: Pupils equal, round, reactive to light and  accommodation. No scleral icterus. Extraocular muscles intact.  HEENT: Head atraumatic, normocephalic. Oropharynx and nasopharynx clear.  NECK:  Supple, no jugular venous distention. No thyroid enlargement, no tenderness.  LUNGS: Normal breath sounds bilaterally, no wheezing, rales,rhonchi or crepitation. No use of accessory muscles of respiration.  CARDIOVASCULAR: S1, S2 normal. No murmurs, rubs, or gallops.  ABDOMEN: Soft, mild discomfort on deep palpation on the left, but no rebound or guarding were noted. However, upon further and repeated evaluation, no significant discomfort was noted, nondistended. Bowel sounds present. No organomegaly or mass.  EXTREMITIES: No pedal edema, cyanosis, or clubbing.  NEUROLOGIC: Cranial nerves II through XII are intact. Muscle strength 5/5 in all extremities. Sensation intact. Gait not checked.  PSYCHIATRIC: The patient is very somnolent, opens eyes, able to follow commands intermittently, converses briefly yes and no with minimal explanations, answer some questions. Not able to assess his orientation SKIN: No obvious rash, lesion, or ulcer.   LABORATORY PANEL:   CBC  Recent Labs Lab 09/26/16 0802  WBC 3.2*  HGB 13.4  HCT 38.7*  PLT 237  MCV 85.8  MCH 29.7  MCHC 34.6  RDW 11.9   ------------------------------------------------------------------------------------------------------------------  Chemistries   Recent Labs Lab 09/26/16 0802  NA 139  K 3.8  CL 104  CO2 29  GLUCOSE 91  BUN 8  CREATININE 1.24  CALCIUM 8.8*  AST 14*  ALT 9*  ALKPHOS 45  BILITOT 0.4   ------------------------------------------------------------------------------------------------------------------  Cardiac Enzymes No results for input(s): TROPONINI in the last 168 hours. ------------------------------------------------------------------------------------------------------------------  RADIOLOGY: Ct Head Wo Contrast  Result Date: 09/26/2016 CLINICAL  DATA:  Seizure with unresponsiveness EXAM: CT HEAD WITHOUT CONTRAST TECHNIQUE: Contiguous axial images were obtained from the base of the skull through the vertex without intravenous contrast. COMPARISON:  July 08, 2016 FINDINGS: Brain: The ventricles are normal in size and configuration. There is no intracranial mass, hemorrhage, extra-axial fluid collection, or midline shift. Gray-white compartments are normal. No acute infarct evident. Vascular: There is no hyperdense vessel. There is no appreciable arterial vascular calcification. Skull: Bony calvarium appears intact. Sinuses/Orbits: There is mucosal thickening in the medial aspect of the right sphenoid sinus as well as in the posterior aspect of the left ethmoid sinus region. Other paranasal sinuses which are visualized are clear. Visualized orbits appear symmetric bilaterally. Other: Visualized mastoid air cells are clear. IMPRESSION: Areas of paranasal sinus disease. No intracranial mass, hemorrhage, or extra-axial fluid collection. Gray-white compartments appear normal. Electronically Signed   By: Bretta Bang III M.D.   On: 09/26/2016 07:45    EKG: Orders placed or performed during the hospital encounter of 09/26/16  . EKG 12-Lead  . EKG 12-Lead  EKG in the emergency room reveals sinus rhythm at 77 bpm, RSR prime in V1 or V2,  felt to be possible normal variant, ST elevation in V3. No acute ST-T changes  IMPRESSION AND PLAN:  Active Problems:   Valproic acid toxicity   Toxic metabolic encephalopathy   Leukopenia   Seizure disorder (HCC)   Toxic encephalopathy  #1. Toxic metabolic encephalopathy, likely due to valproic acid toxicity as well as question will seizure episode, continue patient on Ativan as needed. Intermittently, hold valproic acid, get ammonia level checked, get nephrologist involved for recommendations, may need an electroencephalogram if mental status does not improve #2 (valproic acid toxicity, hold valproic acid,  check level in the morning #3. Leukopenia, likely related to valproic acid toxicity #4. History of seizure disorder with likely recurrent seizure, getting neurologist to review patient's medications   All the records are reviewed and case discussed with ED provider. Management plans discussed with the patient, family and they are in agreement.  CODE STATUS: Code Status History    Date Active Date Inactive Code Status Order ID Comments User Context   05/09/2016  5:55 PM 05/13/2016  5:13 PM Full Code [id number]  Enedina FinnerSona Patel, MD Inpatient   04/18/2016  5:12 PM 04/19/2016  6:38 PM Full Code [id number]  Wyatt Hasteavid K Hower, MD ED   10/16/2015  6:09 PM 10/18/2015  5:36 PM Full Code [id number]  Enid Baasadhika Kalisetti, MD Inpatient       TOTAL TIME TAKING CARE OF THIS PATIENT: 55 minutes.    Katharina CaperVAICKUTE,Alizeh Madril M.D on 09/26/2016 at 12:50 PM  Between 7am to 6pm - Pager - [phone] After 6pm go to [email or web address] - password [email or web address]  Fabio Ne[facility]ors[facility] [place] [facility]  Office  [phone]  CC: Primary care physician; Pcp Not In System

## 2016-09-26 NOTE — ED Notes (Signed)
Pt's mother at bedside.

## 2016-09-26 NOTE — ED Provider Notes (Signed)
[facility] Emergency Department Provider Note   ____________________________________________   First MD Initiated Contact with Patient 09/26/16 [phone]     (approximate)  I have reviewed the triage vital signs and the nursing notes.   HISTORY  Chief Complaint Seizures  The patient is post ictal  HPI Keith Chung. is a 36 y.o. male who comes into the hospital today after being found unresponsive on the floor. The patient has a history of seizures and 911 was called out for a 911 hang-up. When the police arrived to the patient's hotel he was found face down next to the floor. He was unresponsive and given 2 mg of Narcan which aroused the patient. The police said that nothing was found in the room he doesn't have a history of drug abuse so they contacted EMS to bring the patient in. He takes Keppra and Depakote. According to EMS he was in and out of it.The patient is unable to answer questions due to being post ictal.    Past Medical History:  Diagnosis Date  . Learning disorder   . Seasonal allergies   . Seizures [facility])     Patient Active Problem List   Diagnosis Date Noted  . 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 Seizure (HCC) 04/18/2016  . Epilepsy (HCC) 10/17/2015    Past Surgical History:  Procedure Laterality Date  . HAND SURGERY     right hand surgery  . VASECTOMY Bilateral     Prior to Admission medications   Medication Sig Start Date End Date Taking? Authorizing Provider  lamoTRIgine (LAMICTAL) 100 MG tablet Take 100 mg by mouth 2 (two) times daily.   Yes Historical Provider, MD  divalproex (DEPAKOTE) 500 MG DR tablet Take 1 tablet (500 mg total) by mouth 3 (three) times daily. Patient taking differently: Take 500 mg by mouth 2 (two) times daily.  05/13/16   Houston Siren, MD  famotidine (PEPCID) 20 MG tablet Take 1 tablet (20 mg total) by mouth 2 (two) times daily. 09/17/16   Sharman Cheek, MD  ibuprofen (ADVIL,MOTRIN) 600 MG  tablet Take 600 mg by mouth every 6 (six) hours as needed.    Historical Provider, MD  lamoTRIgine (LAMICTAL) 25 MG CHEW chewable tablet Chew 1 tablet by mouth 2 (two) times daily.     Historical Provider, MD  levETIRAcetam (KEPPRA) 500 MG tablet Take 1500 mg in am and 1500 mg in pm. Patient taking differently: Take 500 mg by mouth 2 (two) times daily.  05/08/16   Levert Feinstein, MD  oxyCODONE-acetaminophen (ROXICET) 5-325 MG tablet Take 1 tablet by mouth every 6 (six) hours as needed. Patient not taking: Reported on 09/17/2016 07/08/16   Rebecka Apley, MD    Allergies Pollen extract  Family History  Problem Relation Age of Onset  . Cancer Father     Unsure of type  . Hypertension Father   . Hypertension Mother   . Diabetes Mellitus II Maternal Grandmother     Social History Social History  Substance Use Topics  . Smoking status: Former Smoker    Packs/day: 0.30    Types: Cigarettes    Quit date: 07/18/2014  . Smokeless tobacco: Never Used     Comment: stopped 4 months ago  . Alcohol use No     Comment: socially    Review of Systems Constitutional: No fever/chills Eyes: No visual changes. ENT: No sore throat. Cardiovascular: Denies chest pain. Respiratory: Denies shortness of breath. Gastrointestinal: No abdominal  pain.  No nausea, no vomiting.  No diarrhea.  No constipation. Genitourinary: Negative for dysuria. Musculoskeletal: Negative for back pain. Skin: Negative for rash. Neurological: seizure  10-point ROS otherwise negative.  ____________________________________________   PHYSICAL EXAM:  VITAL SIGNS: ED Triage Vitals  Enc Vitals Group     BP 09/26/16 0618 (!) 136/94     Pulse Rate 09/26/16 0618 77     Resp 09/26/16 0618 17     Temp 09/26/16 0618 97.4 F (36.3 C)     Temp Source 09/26/16 0618 Axillary     SpO2 09/26/16 0618 100 %     Weight 09/26/16 0619 165 lb (74.8 kg)     Height 09/26/16 0619 6\' 2"  (1.88 m)     Head Circumference --      Peak Flow  --      Pain Score 09/26/16 0658 0     Pain Loc --      Pain Edu? --      Excl. in GC? --     Constitutional: Alert and oriented. Well appearing and in moderate distress. Eyes: Conjunctivae are normal. PERRL. EOMI. Head: Atraumatic. Nose: No congestion/rhinnorhea. Mouth/Throat: Mucous membranes are moist.  Oropharynx non-erythematous. Cardiovascular: Normal rate, regular rhythm. Grossly normal heart sounds.  Good peripheral circulation. Respiratory: Normal respiratory effort.  No retractions. Lungs CTAB. Gastrointestinal: Soft and nontender. No distention. Positive bowel sounds Musculoskeletal: No lower extremity tenderness nor edema.  No joint effusions. Neurologic:  Somnolent and slightly conbative. Skin:  Skin is warm, dry and intact.  Psychiatric: somnolent  ____________________________________________   LABS (all labs ordered are listed, but only abnormal results are displayed)  Labs Reviewed  URINALYSIS, COMPLETE (UACMP) WITH MICROSCOPIC  VALPROIC ACID LEVEL  ETHANOL  URINE DRUG SCREEN, QUALITATIVE ([facility] ONLY)  CBC  COMPREHENSIVE METABOLIC PANEL  LEVETIRACETAM LEVEL   ____________________________________________  EKG  ED ECG REPORT I, Rebecka Apley, the attending physician, personally viewed and interpreted this ECG.   Date: 09/26/2016  EKG Time: 0616  Rate: 77  Rhythm: normal sinus rhythm  Axis: normal  Intervals:none  ST&T Change: none  ____________________________________________  RADIOLOGY   CT head ____________________________________________   PROCEDURES  Procedure(s) performed: None  Procedures  Critical Care performed: No  ____________________________________________   INITIAL IMPRESSION / ASSESSMENT AND PLAN / ED COURSE  Pertinent labs & imaging results that were available during my care of the patient were reviewed by me and considered in my medical decision making (see chart for details).  This is a 36 year old male with  a history of seizures who comes in tonight after being found unresponsive. We think he may have had another seizure. The patient was combative when he initially came in but was not answering questions. I feel that the patient is postictal at this time. We did give him 2 mg of Ativan IV. I will check some blood work we will reassess the patient.  Clinical Course    The patient's care will be signed out to Dr. Silverio Lay who will follow up on the patient results. He has had many seizures in the past and his symptoms are likely from a seizure. He will be reassessed.   ____________________________________________   FINAL CLINICAL IMPRESSION(S) / ED DIAGNOSES  Final diagnoses:  Seizure (HCC)      NEW MEDICATIONS STARTED DURING THIS VISIT:  New Prescriptions   No medications on file     Note:  This document was prepared using Dragon voice recognition software and may include unintentional  dictation errors.    Rebecka ApleyAllison P Webster, MD 09/26/16 [phone]

## 2016-09-26 NOTE — ED Notes (Signed)
Pt remains very drowsy.  Will open eyes briefly to voice but does not answer questions. No seizure activity since arrival,  Continue to monitor pt.

## 2016-09-26 NOTE — ED Triage Notes (Signed)
Per EMS, pt was found unconscious after hang up 911 call.  Police officers have 2mg  narcan intranasal and pt aroused, but EMS believes this may have been coincidence.  Pt states he called 911 because he could feel a seizure coming on.  Pt rigid and actively seizing upon arrival to ER.  Pt unable to answer any questions at this time.  EDP gave VO for ativan 2mg  IV and medication given to patient by this RN.

## 2016-09-26 NOTE — ED Provider Notes (Signed)
  Physical Exam  BP 127/83   Pulse 60   Temp 97.4 F (36.3 C) (Axillary)   Resp 15   Ht 6\' 2"  (1.88 m)   Wt 165 lb (74.8 kg)   SpO2 100%   BMI 21.18 kg/m   Physical Exam  ED Course  Procedures  MDM Care assumed at 7 am. Patient here with seizures. Hx of seizures on depakote, lamictal, keppra. Patient was given ativan at 6 am. Sign out pending CT head, labs.   11:28 AM Labs unremarkable. Depakote 108. Observed for 5 hrs in the ED. Still altered and unable to wake up and talk. Concerned for possible prolonged post ictal period. No obvious seizure activity. Consulted Dr. Thad Rangereynolds, who will do another EEG on patient. Since depakote is slightly supratherapeutic, will hold off on loading with seizure meds for now.     Charlynne Panderavid Hsienta Alissa Pharr, MD 09/26/16 [phone]

## 2016-09-27 ENCOUNTER — Inpatient Hospital Stay: Payer: Medicaid Other

## 2016-09-27 DIAGNOSIS — R29898 Other symptoms and signs involving the musculoskeletal system: Secondary | ICD-10-CM

## 2016-09-27 DIAGNOSIS — G40909 Epilepsy, unspecified, not intractable, without status epilepticus: Secondary | ICD-10-CM

## 2016-09-27 LAB — URINALYSIS, COMPLETE (UACMP) WITH MICROSCOPIC
BACTERIA UA: NONE SEEN
BILIRUBIN URINE: NEGATIVE
Glucose, UA: NEGATIVE mg/dL
Hgb urine dipstick: NEGATIVE
KETONES UR: 5 mg/dL — AB
LEUKOCYTES UA: NEGATIVE
Nitrite: NEGATIVE
PROTEIN: 30 mg/dL — AB
SQUAMOUS EPITHELIAL / LPF: NONE SEEN
Specific Gravity, Urine: 1.029 (ref 1.005–1.030)
pH: 6 (ref 5.0–8.0)

## 2016-09-27 LAB — LAMOTRIGINE LEVEL: LAMOTRIGINE LVL: 8.1 ug/mL (ref 2.0–20.0)

## 2016-09-27 LAB — URINE DRUG SCREEN, QUALITATIVE (ARMC ONLY)
Amphetamines, Ur Screen: NOT DETECTED
BARBITURATES, UR SCREEN: POSITIVE — AB
BENZODIAZEPINE, UR SCRN: NOT DETECTED
CANNABINOID 50 NG, UR ~~LOC~~: POSITIVE — AB
Cocaine Metabolite,Ur ~~LOC~~: POSITIVE — AB
MDMA (Ecstasy)Ur Screen: NOT DETECTED
METHADONE SCREEN, URINE: NOT DETECTED
Opiate, Ur Screen: NOT DETECTED
Phencyclidine (PCP) Ur S: NOT DETECTED
TRICYCLIC, UR SCREEN: NOT DETECTED

## 2016-09-27 LAB — CBC
HCT: 39.5 % — ABNORMAL LOW (ref 40.0–52.0)
Hemoglobin: 13.9 g/dL (ref 13.0–18.0)
MCH: 29.9 pg (ref 26.0–34.0)
MCHC: 35.1 g/dL (ref 32.0–36.0)
MCV: 85.3 fL (ref 80.0–100.0)
PLATELETS: 222 10*3/uL (ref 150–440)
RBC: 4.63 MIL/uL (ref 4.40–5.90)
RDW: 11.3 % — ABNORMAL LOW (ref 11.5–14.5)
WBC: 2.9 10*3/uL — ABNORMAL LOW (ref 3.8–10.6)

## 2016-09-27 LAB — BASIC METABOLIC PANEL
Anion gap: 5 (ref 5–15)
BUN: 10 mg/dL (ref 6–20)
CALCIUM: 8.9 mg/dL (ref 8.9–10.3)
CO2: 30 mmol/L (ref 22–32)
CREATININE: 1.17 mg/dL (ref 0.61–1.24)
Chloride: 106 mmol/L (ref 101–111)
Glucose, Bld: 88 mg/dL (ref 65–99)
Potassium: 3.8 mmol/L (ref 3.5–5.1)
Sodium: 141 mmol/L (ref 135–145)

## 2016-09-27 LAB — VALPROIC ACID LEVEL: VALPROIC ACID LVL: 48 ug/mL — AB (ref 50.0–100.0)

## 2016-09-27 MED ORDER — DIVALPROEX SODIUM 250 MG PO DR TAB
250.0000 mg | DELAYED_RELEASE_TABLET | Freq: Two times a day (BID) | ORAL | Status: DC
  Administered 2016-09-27: 12:00:00 250 mg via ORAL
  Filled 2016-09-27: qty 1

## 2016-09-27 MED ORDER — LEVETIRACETAM 750 MG PO TABS
750.0000 mg | ORAL_TABLET | Freq: Two times a day (BID) | ORAL | Status: DC
  Administered 2016-09-27 – 2016-09-28 (×3): 750 mg via ORAL
  Filled 2016-09-27 (×3): qty 1

## 2016-09-27 MED ORDER — DIVALPROEX SODIUM 250 MG PO DR TAB
500.0000 mg | DELAYED_RELEASE_TABLET | Freq: Two times a day (BID) | ORAL | Status: DC
  Administered 2016-09-27 – 2016-09-29 (×4): 500 mg via ORAL
  Filled 2016-09-27 (×4): qty 2

## 2016-09-27 MED ORDER — DIVALPROEX SODIUM 250 MG PO DR TAB: 500.0000 mg | DELAYED_RELEASE_TABLET | Freq: Three times a day (TID) | ORAL | Status: DC

## 2016-09-27 MED ORDER — ORAL CARE MOUTH RINSE
15.0000 mL | Freq: Two times a day (BID) | OROMUCOSAL | Status: DC
  Administered 2016-09-27 – 2016-09-29 (×5): 15 mL via OROMUCOSAL

## 2016-09-27 MED ORDER — ACETAMINOPHEN 325 MG PO TABS
650.0000 mg | ORAL_TABLET | Freq: Four times a day (QID) | ORAL | Status: DC | PRN
  Administered 2016-09-27 – 2016-09-28 (×3): 650 mg via ORAL
  Filled 2016-09-27 (×3): qty 2

## 2016-09-27 NOTE — Progress Notes (Signed)
Patient made aware of need for urine collection. Clean urinal placed at bedside. Keith Chung,Keith Ostlund S, RN

## 2016-09-27 NOTE — Consult Note (Signed)
Reason for Consult:Seizures Referring Physician: Elisabeth PigeonVachhani  CC: Seizures  HPI: Keith Bottomrnest C Cordon Jr. is an 36 y.o. male with a known history of seizures who provides very little history today.  Most history obtained from the chart.  Patient was found by police department in a hotel room. After someone called 911, and left, no further history was available.  Patient felt that he had a seizure.  Reported biting his tongue.  On Police Department arrival, patient was given Narcan with no improvement and he was brought to emergency room for further evaluation and treatment, where he was given Ativan.  Patient has had no further seizures since admission.   Patient reports that he is also unable to move his legs and although initially on presentation was not incontinent has overnight become incontinent.    Past Medical History:  Diagnosis Date  . Learning disorder   . Seasonal allergies   . Seizures (HCC)     Past Surgical History:  Procedure Laterality Date  . HAND SURGERY     right hand surgery  . VASECTOMY Bilateral     Family History  Problem Relation Age of Onset  . Cancer Father     Unsure of type  . Hypertension Father   . Hypertension Mother   . Diabetes Mellitus II Maternal Grandmother     Social History:  reports that he quit smoking about 2 years ago. His smoking use included Cigarettes. He smoked 0.30 packs per day. He has never used smokeless tobacco. He reports that he does not drink alcohol or use drugs.  Allergies  Allergen Reactions  . Pollen Extract Other (See Comments)    Runny nose, watery eyes, sneezing    Medications:  I have reviewed the patient's current medications. Prior to Admission:  Prescriptions Prior to Admission  Medication Sig Dispense Refill Last Dose  . lamoTRIgine (LAMICTAL) 100 MG tablet Take 100 mg by mouth 2 (two) times daily.   unknown at unknown  . divalproex (DEPAKOTE) 500 MG DR tablet Take 1 tablet (500 mg total) by mouth 3 (three) times  daily. (Patient taking differently: Take 500 mg by mouth 2 (two) times daily. ) 150 tablet 1 unknown at unknown  . famotidine (PEPCID) 20 MG tablet Take 1 tablet (20 mg total) by mouth 2 (two) times daily. 60 tablet 0 unknown at unkown  . ibuprofen (ADVIL,MOTRIN) 600 MG tablet Take 600 mg by mouth every 6 (six) hours as needed.   unknown at unknown  . lamoTRIgine (LAMICTAL) 25 MG CHEW chewable tablet Chew 1 tablet by mouth 2 (two) times daily.    unknown at unknown  . levETIRAcetam (KEPPRA) 500 MG tablet Take 1500 mg in am and 1500 mg in pm. (Patient taking differently: Take 500 mg by mouth 2 (two) times daily. ) 180 tablet 1 unknown at unknown  . oxyCODONE-acetaminophen (ROXICET) 5-325 MG tablet Take 1 tablet by mouth every 6 (six) hours as needed. (Patient not taking: Reported on 09/17/2016) 12 tablet 0 unknown at unknown   Scheduled: . divalproex  250 mg Oral BID  . enoxaparin (LOVENOX) injection  40 mg Subcutaneous Q24H  . famotidine  20 mg Oral BID  . lamoTRIgine  125 mg Oral BID  . levETIRAcetam  500 mg Oral BID  . mouth rinse  15 mL Mouth Rinse BID  . sodium chloride flush  3 mL Intravenous Q12H    ROS: History obtained from the patient  General ROS: negative for - chills, fatigue, fever, night sweats,  weight gain or weight loss Psychological ROS: negative for - behavioral disorder, hallucinations, memory difficulties, mood swings or suicidal ideation Ophthalmic ROS: negative for - blurry vision, double vision, eye pain or loss of vision ENT ROS: negative for - epistaxis, nasal discharge, oral lesions, sore throat, tinnitus or vertigo Allergy and Immunology ROS: negative for - hives or itchy/watery eyes Hematological and Lymphatic ROS: negative for - bleeding problems, bruising or swollen lymph nodes Endocrine ROS: negative for - galactorrhea, hair pattern changes, polydipsia/polyuria or temperature intolerance Respiratory ROS: negative for - cough, hemoptysis, shortness of breath or  wheezing Cardiovascular ROS: negative for - chest pain, dyspnea on exertion, edema or irregular heartbeat Gastrointestinal ROS: negative for - abdominal pain, diarrhea, hematemesis, nausea/vomiting or stool incontinence Genito-Urinary ROS: negative for - dysuria, hematuria, incontinence or urinary frequency/urgency Musculoskeletal ROS: negative for - joint swelling or muscular weakness Neurological ROS: as noted in HPI Dermatological ROS: negative for rash and skin lesion changes  Physical Examination: Blood pressure 123/72, pulse 66, temperature 98.2 F (36.8 C), resp. rate 16, height 6\' 2"  (1.88 m), weight 74.8 kg (165 lb), SpO2 100 %.  HEENT-  Normocephalic, no lesions, without obvious abnormality.  Normal external eye and conjunctiva.  Normal TM's bilaterally.  Normal auditory canals and external ears. Normal external nose, mucus membranes and septum.  Normal pharynx. Cardiovascular- S1, S2 normal, pulses palpable throughout   Lungs- chest clear, no wheezing, rales, normal symmetric air entry Abdomen- soft, non-tender; bowel sounds normal; no masses,  no organomegaly Extremities- no edema Lymph-no adenopathy palpable Musculoskeletal-no joint tenderness, deformity or swelling Skin-warm and dry, no hyperpigmentation, vitiligo, or suspicious lesions  Neurological Examination Mental Status: Lethargic.  Patient uncooperative for the majority of the testing.  Speech minimal but fluent.  Follows some commands.   Cranial Nerves: II: Discs flat bilaterally; Visual fields grossly normal, pupils equal, round, reactive to light and accommodation III,IV, VI: ptosis not present, extra-ocular motions intact bilaterally V,VII: smile symmetric, facial light touch sensation decreased on the entire head VIII: hearing normal bilaterally IX,X: gag reflex present XI: bilateral shoulder shrug XII: midline tongue extension Motor: Spontaneously lifts both upper extremities off the bed.  Reports unable to  lift either leg or wiggle the toes but when I lift the leg at the thigh, the entire leg remains extended and he reports that he is unable to bend the legs.   Sensory: Pinprick and light touch decreased throughout but less so on the LUE.   Deep Tendon Reflexes: 2+ and symmetric throughout Plantars: Right: upgoing   Left: upgoing Cerebellar: Unable to perform Gait: not tested due to safety concerns    Laboratory Studies:   Basic Metabolic Panel:  Recent Labs Lab 09/26/16 0802 09/27/16 0440  NA 139 141  K 3.8 3.8  CL 104 106  CO2 29 30  GLUCOSE 91 88  BUN 8 10  CREATININE 1.24 1.17  CALCIUM 8.8* 8.9    Liver Function Tests:  Recent Labs Lab 09/26/16 0802  AST 14*  ALT 9*  ALKPHOS 45  BILITOT 0.4  PROT 6.8  ALBUMIN 4.0   No results for input(s): LIPASE, AMYLASE in the last 168 hours.  Recent Labs Lab 09/26/16 1924  AMMONIA 58*    CBC:  Recent Labs Lab 09/26/16 0802 09/27/16 0440  WBC 3.2* 2.9*  HGB 13.4 13.9  HCT 38.7* 39.5*  MCV 85.8 85.3  PLT 237 222    Cardiac Enzymes: No results for input(s): CKTOTAL, CKMB, CKMBINDEX, TROPONINI in the last 168  hours.  BNP: Invalid input(s): POCBNP  CBG: No results for input(s): GLUCAP in the last 168 hours.  Microbiology: No results found for this or any previous visit.  Coagulation Studies: No results for input(s): LABPROT, INR in the last 72 hours.  Urinalysis: No results for input(s): COLORURINE, LABSPEC, PHURINE, GLUCOSEU, HGBUR, BILIRUBINUR, KETONESUR, PROTEINUR, UROBILINOGEN, NITRITE, LEUKOCYTESUR in the last 168 hours.  Invalid input(s): APPERANCEUR  Lipid Panel:     Component Value Date/Time   CHOL 150 11/02/2015   TRIG 69 11/02/2015   HDL 54 11/02/2015   LDLCALC 82 11/02/2015    HgbA1C: No results found for: HGBA1C  Urine Drug Screen:     Component Value Date/Time   LABOPIA NONE DETECTED 01/05/2016 1143   LABOPIA NONE DETECTED 05/12/2015 1752   COCAINSCRNUR NONE DETECTED 01/05/2016  1143   LABBENZ NONE DETECTED 01/05/2016 1143   LABBENZ NONE DETECTED 05/12/2015 1752   AMPHETMU NONE DETECTED 01/05/2016 1143   AMPHETMU NONE DETECTED 05/12/2015 1752   THCU NONE DETECTED 01/05/2016 1143   THCU POSITIVE (A) 05/12/2015 1752   LABBARB NONE DETECTED 01/05/2016 1143   LABBARB NONE DETECTED 05/12/2015 1752    Alcohol Level:  Recent Labs Lab 09/26/16 1012  ETH <5    Other results: EKG: sinus rhythm at 77 bpm.  Imaging: Ct Head Wo Contrast  Result Date: 09/26/2016 CLINICAL DATA:  Seizure with unresponsiveness EXAM: CT HEAD WITHOUT CONTRAST TECHNIQUE: Contiguous axial images were obtained from the base of the skull through the vertex without intravenous contrast. COMPARISON:  July 08, 2016 FINDINGS: Brain: The ventricles are normal in size and configuration. There is no intracranial mass, hemorrhage, extra-axial fluid collection, or midline shift. Gray-white compartments are normal. No acute infarct evident. Vascular: There is no hyperdense vessel. There is no appreciable arterial vascular calcification. Skull: Bony calvarium appears intact. Sinuses/Orbits: There is mucosal thickening in the medial aspect of the right sphenoid sinus as well as in the posterior aspect of the left ethmoid sinus region. Other paranasal sinuses which are visualized are clear. Visualized orbits appear symmetric bilaterally. Other: Visualized mastoid air cells are clear. IMPRESSION: Areas of paranasal sinus disease. No intracranial mass, hemorrhage, or extra-axial fluid collection. Gray-white compartments appear normal. Electronically Signed   By: Bretta Bang III M.D.   On: 09/26/2016 07:45     Assessment/Plan: 36 year old male with a history of seizures presenting with breakthrough seizures.  Patient on Keppra, Lamictal and Depakote. Followed by Dr. Malvin Johns and on review of his last notes to be on Keppra 750mg  BID, Lamictal 125mg  BID and Depakote 500mg  BID.  Initial VPA level 108, which in the  clinical setting of patient being asymptomatic and seizures being difficult to control would not change dosing or consider toxic.  Will place patient back on last documented doses.   Patient also reports that he is unable to feel and move his lower extremities.  Now with some incontinence as well.  Patient clearly has more strength in his lower extremities that he is giving on examination but due to the fact that the circumstances around his admission are unclear and he is a poor historian, further work up recommended.  Head CT reviewed and shows no acute changes.    Recommendations: 1.  STAT MRI of the cervical and lumbar spines 2.  Last prescribed AED doses reinitiated.   3.  Seizure precautions 4.  Ativan prn 5.  Depakote level in AM  Thana Farr, MD Neurology [phone] 09/27/2016, 11:59 AM

## 2016-09-27 NOTE — Plan of Care (Signed)
Problem: Education: Goal: Knowledge of [facility] Education information/materials will improve Outcome: Progressing VSS, free of falls during shift.  No seizure activity since admission to unit.  Denies pain, nausea, no complaints overnight.  Bed in low position, rails padded, bed alarm on.  Call bell within reach, WCTM.

## 2016-09-27 NOTE — Progress Notes (Signed)
[facility] - [place] at [facility]   PATIENT NAME: Keith Chung    MR#:  [id number]  DATE OF BIRTH:  06-22-1981  SUBJECTIVE:  CHIEF COMPLAINT:   Chief Complaint  Patient presents with  . Seizures     Brought with confusion.suspected to have seizures vs stroke.   Alert today. Have c/o not able to move both legs.  REVIEW OF SYSTEMS:  CONSTITUTIONAL: No fever, fatigue or weakness.  EYES: No blurred or double vision.  EARS, NOSE, AND THROAT: No tinnitus or ear pain.  RESPIRATORY: No cough, shortness of breath, wheezing or hemoptysis.  CARDIOVASCULAR: No chest pain, orthopnea, edema.  GASTROINTESTINAL: No nausea, vomiting, diarrhea or abdominal pain.  GENITOURINARY: No dysuria, hematuria.  ENDOCRINE: No polyuria, nocturia,  HEMATOLOGY: No anemia, easy bruising or bleeding SKIN: No rash or lesion. MUSCULOSKELETAL: No joint pain or arthritis.   NEUROLOGIC: No tingling, numbness, b/l legs weakness.  PSYCHIATRY: No anxiety or depression.   ROS  DRUG ALLERGIES:   Allergies  Allergen Reactions  . Pollen Extract Other (See Comments)    Runny nose, watery eyes, sneezing    VITALS:  Blood pressure (!) 139/91, pulse 68, temperature 98.2 F (36.8 C), resp. rate 18, height 6\' 2"  (1.88 m), weight 74.8 kg (165 lb), SpO2 100 %.  PHYSICAL EXAMINATION:  GENERAL:  36 y.o.-year-old patient lying in the bed with no acute distress.  EYES: Pupils equal, round, reactive to light and accommodation. No scleral icterus. Extraocular muscles intact.  HEENT: Head atraumatic, normocephalic. Oropharynx and nasopharynx clear.  NECK:  Supple, no jugular venous distention. No thyroid enlargement, no tenderness.  LUNGS: Normal breath sounds bilaterally, no wheezing, rales,rhonchi or crepitation. No use of accessory muscles of respiration.  CARDIOVASCULAR: S1, S2 normal. No murmurs, rubs, or gallops.  ABDOMEN: Soft, nontender, nondistended. Bowel sounds present. No organomegaly or  mass.  EXTREMITIES: No pedal edema, cyanosis, or clubbing.  NEUROLOGIC: Cranial nerves II through XII are intact. Muscle strength 5/5 in upper extremities 1/5 in both lower extremities. Sensation intact. Gait not checked.  PSYCHIATRIC: The patient is alert and oriented x 3.  SKIN: No obvious rash, lesion, or ulcer.   Physical Exam LABORATORY PANEL:   CBC  Recent Labs Lab 09/27/16 0440  WBC 2.9*  HGB 13.9  HCT 39.5*  PLT 222   ------------------------------------------------------------------------------------------------------------------  Chemistries   Recent Labs Lab 09/26/16 0802 09/27/16 0440  NA 139 141  K 3.8 3.8  CL 104 106  CO2 29 30  GLUCOSE 91 88  BUN 8 10  CREATININE 1.24 1.17  CALCIUM 8.8* 8.9  AST 14*  --   ALT 9*  --   ALKPHOS 45  --   BILITOT 0.4  --    ------------------------------------------------------------------------------------------------------------------  Cardiac Enzymes No results for input(s): TROPONINI in the last 168 hours. ------------------------------------------------------------------------------------------------------------------  RADIOLOGY:  Ct Head Wo Contrast  Result Date: 09/26/2016 CLINICAL DATA:  Seizure with unresponsiveness EXAM: CT HEAD WITHOUT CONTRAST TECHNIQUE: Contiguous axial images were obtained from the base of the skull through the vertex without intravenous contrast. COMPARISON:  July 08, 2016 FINDINGS: Brain: The ventricles are normal in size and configuration. There is no intracranial mass, hemorrhage, extra-axial fluid collection, or midline shift. Gray-white compartments are normal. No acute infarct evident. Vascular: There is no hyperdense vessel. There is no appreciable arterial vascular calcification. Skull: Bony calvarium appears intact. Sinuses/Orbits: There is mucosal thickening in the medial aspect of the right sphenoid sinus as well as in the posterior aspect of the  left ethmoid sinus region. Other  paranasal sinuses which are visualized are clear. Visualized orbits appear symmetric bilaterally. Other: Visualized mastoid air cells are clear. IMPRESSION: Areas of paranasal sinus disease. No intracranial mass, hemorrhage, or extra-axial fluid collection. Gray-white compartments appear normal. Electronically Signed   By: Bretta Bang III M.D.   On: 09/26/2016 07:45   Mr Cervical Spine Wo Contrast  Result Date: 09/27/2016 CLINICAL DATA:  36 y/o M; 36 y/o M; history of seizures, inability to move legs, incontinence. EXAM: MRI CERVICAL AND LUMBAR SPINE WITHOUT CONTRAST TECHNIQUE: Multiplanar and multiecho pulse sequences of the cervical spine, to include the craniocervical junction and cervicothoracic junction, and lumbar spine, were obtained without intravenous contrast. COMPARISON:  09/26/2016 CT head. FINDINGS: MRI CERVICAL SPINE FINDINGS Alignment: Physiologic. Vertebrae: No fracture, evidence of discitis, or bone lesion. Cord: Normal signal and morphology. Posterior Fossa, vertebral arteries, paraspinal tissues: Negative. Disc levels: C2-3: Left-sided uncovertebral hypertrophy with mild left foraminal narrowing. No significant canal stenosis. C3-4: Small disc bulge eccentric to the left with left-sided uncovertebral hypertrophy. Moderate left foraminal narrowing. No significant canal stenosis. C4-5: Small disc bulge and left-greater-than-right uncovertebral hypertrophy with mild left foraminal narrowing. No significant canal stenosis. C5-6: No significant disc displacement, foraminal narrowing, or canal stenosis. C6-7: No significant disc displacement, foraminal narrowing, or canal stenosis. C7-T1: No significant disc displacement, foraminal narrowing, or canal stenosis. MRI LUMBAR SPINE FINDINGS Segmentation:  Standard. Alignment:  Physiologic. Vertebrae:  No fracture, evidence of discitis, or bone lesion. Conus medullaris: Extends to the move level and appears normal. Paraspinal and other soft  tissues: Negative. Disc levels: L1-2: No significant disc displacement, foraminal narrowing, or canal stenosis. L2-3: No significant disc displacement, foraminal narrowing, or canal stenosis. L3-4: Small disc bulge with mild foraminal narrowing. No significant canal stenosis. L4-5: Small disc bulge with mild foraminal narrowing. No significant canal stenosis. L5-S1: Small disc bulge and mild facet hypertrophy on the right. Mild bilateral foraminal narrowing. No significant canal stenosis. IMPRESSION: 1. No cervical cord signal abnormality. No conus medullaris cord signal abnormality. 2. No acute osseous abnormality. No high-grade canal stenosis or foraminal narrowing of cervical and lumbar spine. 3. Mild cervical spondylosis greatest at the C3-4 level where a disc bulge and left-sided uncovertebral hypertrophy results in moderate left foraminal narrowing. There is mild left-sided foraminal narrowing at the C2-3 and C4-5 levels. 4. Small disc bulges at the L3 through S1 levels with mild bilateral foraminal narrowing. Electronically Signed   By: Mitzi Hansen M.D.   On: 09/27/2016 15:01   Mr Lumbar Spine Wo Contrast  Result Date: 09/27/2016 CLINICAL DATA:  36 y/o M; 36 y/o M; history of seizures, inability to move legs, incontinence. EXAM: MRI CERVICAL AND LUMBAR SPINE WITHOUT CONTRAST TECHNIQUE: Multiplanar and multiecho pulse sequences of the cervical spine, to include the craniocervical junction and cervicothoracic junction, and lumbar spine, were obtained without intravenous contrast. COMPARISON:  09/26/2016 CT head. FINDINGS: MRI CERVICAL SPINE FINDINGS Alignment: Physiologic. Vertebrae: No fracture, evidence of discitis, or bone lesion. Cord: Normal signal and morphology. Posterior Fossa, vertebral arteries, paraspinal tissues: Negative. Disc levels: C2-3: Left-sided uncovertebral hypertrophy with mild left foraminal narrowing. No significant canal stenosis. C3-4: Small disc bulge eccentric to the  left with left-sided uncovertebral hypertrophy. Moderate left foraminal narrowing. No significant canal stenosis. C4-5: Small disc bulge and left-greater-than-right uncovertebral hypertrophy with mild left foraminal narrowing. No significant canal stenosis. C5-6: No significant disc displacement, foraminal narrowing, or canal stenosis. C6-7: No significant disc displacement, foraminal narrowing, or canal stenosis. C7-T1:  No significant disc displacement, foraminal narrowing, or canal stenosis. MRI LUMBAR SPINE FINDINGS Segmentation:  Standard. Alignment:  Physiologic. Vertebrae:  No fracture, evidence of discitis, or bone lesion. Conus medullaris: Extends to the move level and appears normal. Paraspinal and other soft tissues: Negative. Disc levels: L1-2: No significant disc displacement, foraminal narrowing, or canal stenosis. L2-3: No significant disc displacement, foraminal narrowing, or canal stenosis. L3-4: Small disc bulge with mild foraminal narrowing. No significant canal stenosis. L4-5: Small disc bulge with mild foraminal narrowing. No significant canal stenosis. L5-S1: Small disc bulge and mild facet hypertrophy on the right. Mild bilateral foraminal narrowing. No significant canal stenosis. IMPRESSION: 1. No cervical cord signal abnormality. No conus medullaris cord signal abnormality. 2. No acute osseous abnormality. No high-grade canal stenosis or foraminal narrowing of cervical and lumbar spine. 3. Mild cervical spondylosis greatest at the C3-4 level where a disc bulge and left-sided uncovertebral hypertrophy results in moderate left foraminal narrowing. There is mild left-sided foraminal narrowing at the C2-3 and C4-5 levels. 4. Small disc bulges at the L3 through S1 levels with mild bilateral foraminal narrowing. Electronically Signed   By: Mitzi HansenLance  Furusawa-Stratton M.D.   On: 09/27/2016 15:01    ASSESSMENT AND PLAN:   Active Problems:   Valproic acid toxicity   Toxic metabolic  encephalopathy   Leukopenia   Seizure disorder (HCC)   Toxic encephalopathy  #1. Toxic metabolic encephalopathy,   likely due to valproic acid toxicity as well as question with seizure episode, continue patient on Ativan as needed. Intermittently, hold valproic acid,  ammonia level checked,    Appreciated neurology consult. #2 (valproic acid toxicity, hold valproic acid, check level in the morning #3. Leukopenia, likely related to valproic acid toxicity #4. History of seizure disorder with likely recurrent seizure, getting neurologist to review patient's medications   All the records are reviewed and case discussed with Care Management/Social Workerr. Management plans discussed with the patient, family and they are in agreement.  CODE STATUS: full.  TOTAL TIME TAKING CARE OF THIS PATIENT: 35 minutes.     POSSIBLE D/C IN 1-2 DAYS, DEPENDING ON CLINICAL CONDITION.   Altamese DillingVACHHANI, Volanda Mangine M.D on 09/27/2016   Between 7am to 6pm - Pager - [phone]  After 6pm go to [email or web address] - password [email or web address]  [facility]  Office  [phone]  CC: Primary care physician; Pcp Not In System  Note: This dictation was prepared with Dragon dictation along with smaller phrase technology. Any transcriptional errors that result from this process are unintentional.

## 2016-09-28 LAB — VALPROIC ACID LEVEL: Valproic Acid Lvl: 67 ug/mL (ref 50.0–100.0)

## 2016-09-28 LAB — LEVETIRACETAM LEVEL: LEVETIRACETAM: 9.3 ug/mL — AB (ref 10.0–40.0)

## 2016-09-28 MED ORDER — BUTALBITAL-APAP-CAFFEINE 50-325-40 MG PO TABS
1.0000 | ORAL_TABLET | Freq: Four times a day (QID) | ORAL | Status: DC | PRN
  Administered 2016-09-28 (×2): 1 via ORAL
  Filled 2016-09-28 (×2): qty 1

## 2016-09-28 NOTE — Progress Notes (Signed)
[facility] - [place] at [facility]   PATIENT NAME: Keith Chung    MR#:  [id number]  DATE OF BIRTH:  Nov 24, 1980  SUBJECTIVE:  CHIEF COMPLAINT:   Chief Complaint  Patient presents with  . Seizures     Brought with confusion.suspected to have seizures vs stroke.   Alert today. Had seizures last night. Still complain of numbness and slight weakness in both legs but improved.  REVIEW OF SYSTEMS:  CONSTITUTIONAL: No fever, fatigue or weakness.  EYES: No blurred or double vision.  EARS, NOSE, AND THROAT: No tinnitus or ear pain.  RESPIRATORY: No cough, shortness of breath, wheezing or hemoptysis.  CARDIOVASCULAR: No chest pain, orthopnea, edema.  GASTROINTESTINAL: No nausea, vomiting, diarrhea or abdominal pain.  GENITOURINARY: No dysuria, hematuria.  ENDOCRINE: No polyuria, nocturia,  HEMATOLOGY: No anemia, easy bruising or bleeding SKIN: No rash or lesion. MUSCULOSKELETAL: No joint pain or arthritis.   NEUROLOGIC: No tingling, numbness, b/l legs weakness.  PSYCHIATRY: No anxiety or depression.   ROS  DRUG ALLERGIES:   Allergies  Allergen Reactions  . Pollen Extract Other (See Comments)    Runny nose, watery eyes, sneezing    VITALS:  Blood pressure (!) 146/93, pulse 72, temperature 98 F (36.7 C), resp. rate 18, height 6\' 2"  (1.88 m), weight 74.8 kg (165 lb), SpO2 100 %.  PHYSICAL EXAMINATION:  GENERAL:  36 y.o.-year-old patient lying in the bed with no acute distress.  EYES: Pupils equal, round, reactive to light and accommodation. No scleral icterus. Extraocular muscles intact.  HEENT: Head atraumatic, normocephalic. Oropharynx and nasopharynx clear.  NECK:  Supple, no jugular venous distention. No thyroid enlargement, no tenderness.  LUNGS: Normal breath sounds bilaterally, no wheezing, rales,rhonchi or crepitation. No use of accessory muscles of respiration.  CARDIOVASCULAR: S1, S2 normal. No murmurs, rubs, or gallops.  ABDOMEN: Soft, nontender,  nondistended. Bowel sounds present. No organomegaly or mass.  EXTREMITIES: No pedal edema, cyanosis, or clubbing.  NEUROLOGIC: Cranial nerves II through XII are intact. Muscle strength 5/5 in upper extremities 2-3/5 in both lower extremities. Sensation intact. Gait not checked.  PSYCHIATRIC: The patient is alert and oriented x 3.  SKIN: No obvious rash, lesion, or ulcer.   Physical Exam LABORATORY PANEL:   CBC  Recent Labs Lab 09/27/16 0440  WBC 2.9*  HGB 13.9  HCT 39.5*  PLT 222   ------------------------------------------------------------------------------------------------------------------  Chemistries   Recent Labs Lab 09/26/16 0802 09/27/16 0440  NA 139 141  K 3.8 3.8  CL 104 106  CO2 29 30  GLUCOSE 91 88  BUN 8 10  CREATININE 1.24 1.17  CALCIUM 8.8* 8.9  AST 14*  --   ALT 9*  --   ALKPHOS 45  --   BILITOT 0.4  --    ------------------------------------------------------------------------------------------------------------------  Cardiac Enzymes No results for input(s): TROPONINI in the last 168 hours. ------------------------------------------------------------------------------------------------------------------  RADIOLOGY:  Mr Cervical Spine Wo Contrast  Result Date: 09/27/2016 CLINICAL DATA:  36 y/o M; 36 y/o M; history of seizures, inability to move legs, incontinence. EXAM: MRI CERVICAL AND LUMBAR SPINE WITHOUT CONTRAST TECHNIQUE: Multiplanar and multiecho pulse sequences of the cervical spine, to include the craniocervical junction and cervicothoracic junction, and lumbar spine, were obtained without intravenous contrast. COMPARISON:  09/26/2016 CT head. FINDINGS: MRI CERVICAL SPINE FINDINGS Alignment: Physiologic. Vertebrae: No fracture, evidence of discitis, or bone lesion. Cord: Normal signal and morphology. Posterior Fossa, vertebral arteries, paraspinal tissues: Negative. Disc levels: C2-3: Left-sided uncovertebral hypertrophy with mild left  foraminal narrowing.  No significant canal stenosis. C3-4: Small disc bulge eccentric to the left with left-sided uncovertebral hypertrophy. Moderate left foraminal narrowing. No significant canal stenosis. C4-5: Small disc bulge and left-greater-than-right uncovertebral hypertrophy with mild left foraminal narrowing. No significant canal stenosis. C5-6: No significant disc displacement, foraminal narrowing, or canal stenosis. C6-7: No significant disc displacement, foraminal narrowing, or canal stenosis. C7-T1: No significant disc displacement, foraminal narrowing, or canal stenosis. MRI LUMBAR SPINE FINDINGS Segmentation:  Standard. Alignment:  Physiologic. Vertebrae:  No fracture, evidence of discitis, or bone lesion. Conus medullaris: Extends to the move level and appears normal. Paraspinal and other soft tissues: Negative. Disc levels: L1-2: No significant disc displacement, foraminal narrowing, or canal stenosis. L2-3: No significant disc displacement, foraminal narrowing, or canal stenosis. L3-4: Small disc bulge with mild foraminal narrowing. No significant canal stenosis. L4-5: Small disc bulge with mild foraminal narrowing. No significant canal stenosis. L5-S1: Small disc bulge and mild facet hypertrophy on the right. Mild bilateral foraminal narrowing. No significant canal stenosis. IMPRESSION: 1. No cervical cord signal abnormality. No conus medullaris cord signal abnormality. 2. No acute osseous abnormality. No high-grade canal stenosis or foraminal narrowing of cervical and lumbar spine. 3. Mild cervical spondylosis greatest at the C3-4 level where a disc bulge and left-sided uncovertebral hypertrophy results in moderate left foraminal narrowing. There is mild left-sided foraminal narrowing at the C2-3 and C4-5 levels. 4. Small disc bulges at the L3 through S1 levels with mild bilateral foraminal narrowing. Electronically Signed   By: Mitzi Hansen M.D.   On: 09/27/2016 15:01   Mr Lumbar  Spine Wo Contrast  Result Date: 09/27/2016 CLINICAL DATA:  36 y/o M; 36 y/o M; history of seizures, inability to move legs, incontinence. EXAM: MRI CERVICAL AND LUMBAR SPINE WITHOUT CONTRAST TECHNIQUE: Multiplanar and multiecho pulse sequences of the cervical spine, to include the craniocervical junction and cervicothoracic junction, and lumbar spine, were obtained without intravenous contrast. COMPARISON:  09/26/2016 CT head. FINDINGS: MRI CERVICAL SPINE FINDINGS Alignment: Physiologic. Vertebrae: No fracture, evidence of discitis, or bone lesion. Cord: Normal signal and morphology. Posterior Fossa, vertebral arteries, paraspinal tissues: Negative. Disc levels: C2-3: Left-sided uncovertebral hypertrophy with mild left foraminal narrowing. No significant canal stenosis. C3-4: Small disc bulge eccentric to the left with left-sided uncovertebral hypertrophy. Moderate left foraminal narrowing. No significant canal stenosis. C4-5: Small disc bulge and left-greater-than-right uncovertebral hypertrophy with mild left foraminal narrowing. No significant canal stenosis. C5-6: No significant disc displacement, foraminal narrowing, or canal stenosis. C6-7: No significant disc displacement, foraminal narrowing, or canal stenosis. C7-T1: No significant disc displacement, foraminal narrowing, or canal stenosis. MRI LUMBAR SPINE FINDINGS Segmentation:  Standard. Alignment:  Physiologic. Vertebrae:  No fracture, evidence of discitis, or bone lesion. Conus medullaris: Extends to the move level and appears normal. Paraspinal and other soft tissues: Negative. Disc levels: L1-2: No significant disc displacement, foraminal narrowing, or canal stenosis. L2-3: No significant disc displacement, foraminal narrowing, or canal stenosis. L3-4: Small disc bulge with mild foraminal narrowing. No significant canal stenosis. L4-5: Small disc bulge with mild foraminal narrowing. No significant canal stenosis. L5-S1: Small disc bulge and mild  facet hypertrophy on the right. Mild bilateral foraminal narrowing. No significant canal stenosis. IMPRESSION: 1. No cervical cord signal abnormality. No conus medullaris cord signal abnormality. 2. No acute osseous abnormality. No high-grade canal stenosis or foraminal narrowing of cervical and lumbar spine. 3. Mild cervical spondylosis greatest at the C3-4 level where a disc bulge and left-sided uncovertebral hypertrophy results in moderate left foraminal narrowing. There  is mild left-sided foraminal narrowing at the C2-3 and C4-5 levels. 4. Small disc bulges at the L3 through S1 levels with mild bilateral foraminal narrowing. Electronically Signed   By: Mitzi Hansen M.D.   On: 09/27/2016 15:01    ASSESSMENT AND PLAN:   Active Problems:   Valproic acid toxicity   Toxic metabolic encephalopathy   Leukopenia   Seizure disorder (HCC)   Toxic encephalopathy  #1. Toxic metabolic encephalopathy,   likely due to valproic acid toxicity as well as question with seizure episode, continue patient on Ativan as needed. Intermittently, hold valproic acid,  ammonia level checked,    Appreciated neurology consult.   Currently on depakote 500 mg BID, lamictal 125 mg BID, keppra 750 BID.   Pt had one seizure episode on 09/28/16 2 am- neurologist suggest to keep monitoring for now. #2 valproic acid toxicity, hold valproic acid, check level in the morning- came in safe range. #3. Leukopenia, likely related to valproic acid toxicity #4. History of seizure disorder with likely recurrent seizure, getting neurologist to review patient's medications #5 b/l lower extremity weakness     No spinal cord injuries or pathology.   All the records are reviewed and case discussed with Care Management/Social Workerr. Management plans discussed with the patient, family and they are in agreement.  CODE STATUS: full.  TOTAL TIME TAKING CARE OF THIS PATIENT: 35 minutes.    POSSIBLE D/C IN 1-2 DAYS, DEPENDING  ON CLINICAL CONDITION.   Altamese Dilling M.D on 09/28/2016   Between 7am to 6pm - Pager - [phone]  After 6pm go to [email or web address] - password [email or web address]  [facility] [place] [facility]  Office  [phone]  CC: Primary care physician; Pcp Not In System  Note: This dictation was prepared with Dragon dictation along with smaller phrase technology. Any transcriptional errors that result from this process are unintentional.

## 2016-09-28 NOTE — Significant Event (Signed)
Pt used call bell, staff entered room @ 0229, pt actively seizing.  Muscle convulsions stopped within 1 min after staff entered room, arms contracted, muscles tight for approximately 6 minutes while staff was in room.  First set of VS BP 182/92, 2mg  IV Ativan given, BP @ recheck 129/80.  Pt verbalized feeling better, reported abdominal pain 9/10, PRN PO Tylenol 650mg  given.  Lights off in room, pt resting.  Prime doc paged.

## 2016-09-28 NOTE — Consult Note (Addendum)
Subjective: Patient with seizure at 0229 this morning.  Feels at baseline now.  Moving all extremities.  Reports still numb in the lower extremities.    Objective: Current vital signs: BP (!) 150/76   Pulse 62   Temp 97.7 F (36.5 C) (Oral)   Resp 20   Ht 6\' 2"  (1.88 m)   Wt 74.8 kg (165 lb)   SpO2 100%   BMI 21.18 kg/m  Vital signs in last 24 hours: Temp:  [97.7 F (36.5 C)-98 F (36.7 C)] 97.7 F (36.5 C) (01/12 0420) Pulse Rate:  [62-80] 62 (01/12 0420) Resp:  [18-23] 20 (01/12 0420) BP: (112-182)/(64-92) 150/76 (01/12 0420) SpO2:  [100 %] 100 % (01/12 0232)  Intake/Output from previous day: 01/11 0701 - 01/12 0700 In: 1732.2 [P.O.:600; I.V.:1132.2] Out: 950 [Urine:950] Intake/Output this shift: No intake/output data recorded. Nutritional status: DIET SOFT Room service appropriate? Yes; Fluid consistency: Thin  Neurologic Exam: Mental Status: Alert. Oriented. Follows commands.    Cranial Nerves: II: Discs flat bilaterally; Visual fields grossly normal, pupils equal, round, reactive to light and accommodation III,IV, VI: ptosis not present, extra-ocular motions intact bilaterally V,VII: smile symmetric VIII: hearing normal bilaterally IX,X: gag reflex present XI: bilateral shoulder shrug XII: midline tongue extension Motor: Lifts all extremities against gravity.     Lab Results: Basic Metabolic Panel:  Recent Labs Lab 09/26/16 0802 09/27/16 0440  NA 139 141  K 3.8 3.8  CL 104 106  CO2 29 30  GLUCOSE 91 88  BUN 8 10  CREATININE 1.24 1.17  CALCIUM 8.8* 8.9    Liver Function Tests:  Recent Labs Lab 09/26/16 0802  AST 14*  ALT 9*  ALKPHOS 45  BILITOT 0.4  PROT 6.8  ALBUMIN 4.0   No results for input(s): LIPASE, AMYLASE in the last 168 hours.  Recent Labs Lab 09/26/16 1924  AMMONIA 58*    CBC:  Recent Labs Lab 09/26/16 0802 09/27/16 0440  WBC 3.2* 2.9*  HGB 13.4 13.9  HCT 38.7* 39.5*  MCV 85.8 85.3  PLT 237 222    Cardiac  Enzymes: No results for input(s): CKTOTAL, CKMB, CKMBINDEX, TROPONINI in the last 168 hours.  Lipid Panel: No results for input(s): CHOL, TRIG, HDL, CHOLHDL, VLDL, LDLCALC in the last 168 hours.  CBG: No results for input(s): GLUCAP in the last 168 hours.  Microbiology: No results found for this or any previous visit.  Coagulation Studies: No results for input(s): LABPROT, INR in the last 72 hours.  Imaging: Mr Cervical Spine Wo Contrast  Result Date: 09/27/2016 CLINICAL DATA:  36 y/o M; 36 y/o M; history of seizures, inability to move legs, incontinence. EXAM: MRI CERVICAL AND LUMBAR SPINE WITHOUT CONTRAST TECHNIQUE: Multiplanar and multiecho pulse sequences of the cervical spine, to include the craniocervical junction and cervicothoracic junction, and lumbar spine, were obtained without intravenous contrast. COMPARISON:  09/26/2016 CT head. FINDINGS: MRI CERVICAL SPINE FINDINGS Alignment: Physiologic. Vertebrae: No fracture, evidence of discitis, or bone lesion. Cord: Normal signal and morphology. Posterior Fossa, vertebral arteries, paraspinal tissues: Negative. Disc levels: C2-3: Left-sided uncovertebral hypertrophy with mild left foraminal narrowing. No significant canal stenosis. C3-4: Small disc bulge eccentric to the left with left-sided uncovertebral hypertrophy. Moderate left foraminal narrowing. No significant canal stenosis. C4-5: Small disc bulge and left-greater-than-right uncovertebral hypertrophy with mild left foraminal narrowing. No significant canal stenosis. C5-6: No significant disc displacement, foraminal narrowing, or canal stenosis. C6-7: No significant disc displacement, foraminal narrowing, or canal stenosis. C7-T1: No significant disc displacement,  foraminal narrowing, or canal stenosis. MRI LUMBAR SPINE FINDINGS Segmentation:  Standard. Alignment:  Physiologic. Vertebrae:  No fracture, evidence of discitis, or bone lesion. Conus medullaris: Extends to the move level and  appears normal. Paraspinal and other soft tissues: Negative. Disc levels: L1-2: No significant disc displacement, foraminal narrowing, or canal stenosis. L2-3: No significant disc displacement, foraminal narrowing, or canal stenosis. L3-4: Small disc bulge with mild foraminal narrowing. No significant canal stenosis. L4-5: Small disc bulge with mild foraminal narrowing. No significant canal stenosis. L5-S1: Small disc bulge and mild facet hypertrophy on the right. Mild bilateral foraminal narrowing. No significant canal stenosis. IMPRESSION: 1. No cervical cord signal abnormality. No conus medullaris cord signal abnormality. 2. No acute osseous abnormality. No high-grade canal stenosis or foraminal narrowing of cervical and lumbar spine. 3. Mild cervical spondylosis greatest at the C3-4 level where a disc bulge and left-sided uncovertebral hypertrophy results in moderate left foraminal narrowing. There is mild left-sided foraminal narrowing at the C2-3 and C4-5 levels. 4. Small disc bulges at the L3 through S1 levels with mild bilateral foraminal narrowing. Electronically Signed   By: Mitzi Hansen M.D.   On: 09/27/2016 15:01   Mr Lumbar Spine Wo Contrast  Result Date: 09/27/2016 CLINICAL DATA:  36 y/o M; 36 y/o M; history of seizures, inability to move legs, incontinence. EXAM: MRI CERVICAL AND LUMBAR SPINE WITHOUT CONTRAST TECHNIQUE: Multiplanar and multiecho pulse sequences of the cervical spine, to include the craniocervical junction and cervicothoracic junction, and lumbar spine, were obtained without intravenous contrast. COMPARISON:  09/26/2016 CT head. FINDINGS: MRI CERVICAL SPINE FINDINGS Alignment: Physiologic. Vertebrae: No fracture, evidence of discitis, or bone lesion. Cord: Normal signal and morphology. Posterior Fossa, vertebral arteries, paraspinal tissues: Negative. Disc levels: C2-3: Left-sided uncovertebral hypertrophy with mild left foraminal narrowing. No significant canal  stenosis. C3-4: Small disc bulge eccentric to the left with left-sided uncovertebral hypertrophy. Moderate left foraminal narrowing. No significant canal stenosis. C4-5: Small disc bulge and left-greater-than-right uncovertebral hypertrophy with mild left foraminal narrowing. No significant canal stenosis. C5-6: No significant disc displacement, foraminal narrowing, or canal stenosis. C6-7: No significant disc displacement, foraminal narrowing, or canal stenosis. C7-T1: No significant disc displacement, foraminal narrowing, or canal stenosis. MRI LUMBAR SPINE FINDINGS Segmentation:  Standard. Alignment:  Physiologic. Vertebrae:  No fracture, evidence of discitis, or bone lesion. Conus medullaris: Extends to the move level and appears normal. Paraspinal and other soft tissues: Negative. Disc levels: L1-2: No significant disc displacement, foraminal narrowing, or canal stenosis. L2-3: No significant disc displacement, foraminal narrowing, or canal stenosis. L3-4: Small disc bulge with mild foraminal narrowing. No significant canal stenosis. L4-5: Small disc bulge with mild foraminal narrowing. No significant canal stenosis. L5-S1: Small disc bulge and mild facet hypertrophy on the right. Mild bilateral foraminal narrowing. No significant canal stenosis. IMPRESSION: 1. No cervical cord signal abnormality. No conus medullaris cord signal abnormality. 2. No acute osseous abnormality. No high-grade canal stenosis or foraminal narrowing of cervical and lumbar spine. 3. Mild cervical spondylosis greatest at the C3-4 level where a disc bulge and left-sided uncovertebral hypertrophy results in moderate left foraminal narrowing. There is mild left-sided foraminal narrowing at the C2-3 and C4-5 levels. 4. Small disc bulges at the L3 through S1 levels with mild bilateral foraminal narrowing. Electronically Signed   By: Mitzi Hansen M.D.   On: 09/27/2016 15:01    Medications:  I have reviewed the patient's current  medications. Scheduled: . divalproex  500 mg Oral BID  . enoxaparin (LOVENOX) injection  40 mg Subcutaneous Q24H  . famotidine  20 mg Oral BID  . lamoTRIgine  125 mg Oral BID  . levETIRAcetam  750 mg Oral BID  . mouth rinse  15 mL Mouth Rinse BID  . sodium chloride flush  3 mL Intravenous Q12H    Assessment/Plan: Patient back on last prescribed regimen.  Depakote level 67 this morning.  No rash noted on exam. MRI of the cervical and lumbar spine reviewed and there is no evidence of cord abnormality.  Recommendations: 1.  With so many changes made in medications over the past 24 hours will not make further changes at this time.  If further seizures will increase Keppra to 1000mg  BID. 2.  Continue Lamictal and Depakote at current doses.   LOS: 2 days   Thana Farr, MD Neurology [phone] 09/28/2016  12:05 PM

## 2016-09-28 NOTE — Plan of Care (Signed)
Problem: Education: Goal: Knowledge of [facility] Education information/materials will improve Outcome: Progressing BP elevated during shift, during and after seizure (see prior significant event note).  No injuries.  VSS otherwise, free of falls during shift.  Reported abdominal pain, received PRN PO Tylenol 650mg .  Reported HA pain, PRN Tylenol not yet due.  Dr. Sheryle Hailiamond paged, PRN Fioricet ordered.  Bed in low position, rails padded.  Call bell within reach, WCTM.

## 2016-09-29 MED ORDER — DIVALPROEX SODIUM 250 MG PO DR TAB
1000.0000 mg | DELAYED_RELEASE_TABLET | Freq: Two times a day (BID) | ORAL | Status: DC
  Administered 2016-09-29: 1000 mg via ORAL
  Filled 2016-09-29: qty 4

## 2016-09-29 MED ORDER — LEVETIRACETAM 500 MG PO TABS
1000.0000 mg | ORAL_TABLET | Freq: Two times a day (BID) | ORAL | Status: DC
  Administered 2016-09-29 (×2): 1000 mg via ORAL
  Filled 2016-09-29 (×2): qty 2

## 2016-09-29 NOTE — Progress Notes (Signed)
[facility] - [place] at [facility]   PATIENT NAME: Keith Chung    MR#:  [id number]  DATE OF BIRTH:  June 24, 1981  SUBJECTIVE:  CHIEF COMPLAINT:   Chief Complaint  Patient presents with  . Seizures     Brought with confusion. Suspected to have seizures vs stroke.   Alert today. Had seizures last night. Still complain of numbness and slight weakness in both legs but improved.  REVIEW OF SYSTEMS:  CONSTITUTIONAL: No fever, fatigue or weakness.  EYES: No blurred or double vision.  EARS, NOSE, AND THROAT: No tinnitus or ear pain.  RESPIRATORY: No cough, shortness of breath, wheezing or hemoptysis.  CARDIOVASCULAR: No chest pain, orthopnea, edema.  GASTROINTESTINAL: No nausea, vomiting, diarrhea or abdominal pain.  GENITOURINARY: No dysuria, hematuria.  ENDOCRINE: No polyuria, nocturia,  HEMATOLOGY: No anemia, easy bruising or bleeding SKIN: No rash or lesion. MUSCULOSKELETAL: No joint pain or arthritis.   NEUROLOGIC: No tingling, numbness, b/l legs weakness.  PSYCHIATRY: No anxiety or depression.   ROS  DRUG ALLERGIES:   Allergies  Allergen Reactions  . Pollen Extract Other (See Comments)    Runny nose, watery eyes, sneezing    VITALS:  Blood pressure (!) 141/90, pulse 66, temperature 97.6 F (36.4 C), temperature source Oral, resp. rate 16, height 6\' 2"  (1.88 m), weight 74.8 kg (165 lb), SpO2 99 %.  PHYSICAL EXAMINATION:  GENERAL:  36 y.o.-year-old patient lying in the bed with no acute distress.  EYES: Pupils equal, round, reactive to light and accommodation. No scleral icterus. Extraocular muscles intact.  HEENT: Head atraumatic, normocephalic. Oropharynx and nasopharynx clear.  NECK:  Supple, no jugular venous distention. No thyroid enlargement, no tenderness.  LUNGS: Normal breath sounds bilaterally, no wheezing, rales,rhonchi or crepitation. No use of accessory muscles of respiration.  CARDIOVASCULAR: S1, S2 normal. No murmurs, rubs, or gallops.   ABDOMEN: Soft, nontender, nondistended. Bowel sounds present. No organomegaly or mass.  EXTREMITIES: No pedal edema, cyanosis, or clubbing.  NEUROLOGIC: Cranial nerves II through XII are intact. Muscle strength 5/5 in upper extremities 3-4/5 in both lower extremities. Sensation intact. Gait not checked.  PSYCHIATRIC: The patient is alert and oriented x 3.  SKIN: No obvious rash, lesion, or ulcer.   Physical Exam LABORATORY PANEL:   CBC  Recent Labs Lab 09/27/16 0440  WBC 2.9*  HGB 13.9  HCT 39.5*  PLT 222   ------------------------------------------------------------------------------------------------------------------  Chemistries   Recent Labs Lab 09/26/16 0802 09/27/16 0440  NA 139 141  K 3.8 3.8  CL 104 106  CO2 29 30  GLUCOSE 91 88  BUN 8 10  CREATININE 1.24 1.17  CALCIUM 8.8* 8.9  AST 14*  --   ALT 9*  --   ALKPHOS 45  --   BILITOT 0.4  --    ------------------------------------------------------------------------------------------------------------------  Cardiac Enzymes No results for input(s): TROPONINI in the last 168 hours. ------------------------------------------------------------------------------------------------------------------  RADIOLOGY:  Mr Cervical Spine Wo Contrast  Result Date: 09/27/2016 CLINICAL DATA:  36 y/o M; 36 y/o M; history of seizures, inability to move legs, incontinence. EXAM: MRI CERVICAL AND LUMBAR SPINE WITHOUT CONTRAST TECHNIQUE: Multiplanar and multiecho pulse sequences of the cervical spine, to include the craniocervical junction and cervicothoracic junction, and lumbar spine, were obtained without intravenous contrast. COMPARISON:  09/26/2016 CT head. FINDINGS: MRI CERVICAL SPINE FINDINGS Alignment: Physiologic. Vertebrae: No fracture, evidence of discitis, or bone lesion. Cord: Normal signal and morphology. Posterior Fossa, vertebral arteries, paraspinal tissues: Negative. Disc levels: C2-3: Left-sided uncovertebral  hypertrophy with  mild left foraminal narrowing. No significant canal stenosis. C3-4: Small disc bulge eccentric to the left with left-sided uncovertebral hypertrophy. Moderate left foraminal narrowing. No significant canal stenosis. C4-5: Small disc bulge and left-greater-than-right uncovertebral hypertrophy with mild left foraminal narrowing. No significant canal stenosis. C5-6: No significant disc displacement, foraminal narrowing, or canal stenosis. C6-7: No significant disc displacement, foraminal narrowing, or canal stenosis. C7-T1: No significant disc displacement, foraminal narrowing, or canal stenosis. MRI LUMBAR SPINE FINDINGS Segmentation:  Standard. Alignment:  Physiologic. Vertebrae:  No fracture, evidence of discitis, or bone lesion. Conus medullaris: Extends to the move level and appears normal. Paraspinal and other soft tissues: Negative. Disc levels: L1-2: No significant disc displacement, foraminal narrowing, or canal stenosis. L2-3: No significant disc displacement, foraminal narrowing, or canal stenosis. L3-4: Small disc bulge with mild foraminal narrowing. No significant canal stenosis. L4-5: Small disc bulge with mild foraminal narrowing. No significant canal stenosis. L5-S1: Small disc bulge and mild facet hypertrophy on the right. Mild bilateral foraminal narrowing. No significant canal stenosis. IMPRESSION: 1. No cervical cord signal abnormality. No conus medullaris cord signal abnormality. 2. No acute osseous abnormality. No high-grade canal stenosis or foraminal narrowing of cervical and lumbar spine. 3. Mild cervical spondylosis greatest at the C3-4 level where a disc bulge and left-sided uncovertebral hypertrophy results in moderate left foraminal narrowing. There is mild left-sided foraminal narrowing at the C2-3 and C4-5 levels. 4. Small disc bulges at the L3 through S1 levels with mild bilateral foraminal narrowing. Electronically Signed   By: Mitzi HansenLance  Furusawa-Stratton M.D.   On:  09/27/2016 15:01   Mr Lumbar Spine Wo Contrast  Result Date: 09/27/2016 CLINICAL DATA:  36 y/o M; 36 y/o M; history of seizures, inability to move legs, incontinence. EXAM: MRI CERVICAL AND LUMBAR SPINE WITHOUT CONTRAST TECHNIQUE: Multiplanar and multiecho pulse sequences of the cervical spine, to include the craniocervical junction and cervicothoracic junction, and lumbar spine, were obtained without intravenous contrast. COMPARISON:  09/26/2016 CT head. FINDINGS: MRI CERVICAL SPINE FINDINGS Alignment: Physiologic. Vertebrae: No fracture, evidence of discitis, or bone lesion. Cord: Normal signal and morphology. Posterior Fossa, vertebral arteries, paraspinal tissues: Negative. Disc levels: C2-3: Left-sided uncovertebral hypertrophy with mild left foraminal narrowing. No significant canal stenosis. C3-4: Small disc bulge eccentric to the left with left-sided uncovertebral hypertrophy. Moderate left foraminal narrowing. No significant canal stenosis. C4-5: Small disc bulge and left-greater-than-right uncovertebral hypertrophy with mild left foraminal narrowing. No significant canal stenosis. C5-6: No significant disc displacement, foraminal narrowing, or canal stenosis. C6-7: No significant disc displacement, foraminal narrowing, or canal stenosis. C7-T1: No significant disc displacement, foraminal narrowing, or canal stenosis. MRI LUMBAR SPINE FINDINGS Segmentation:  Standard. Alignment:  Physiologic. Vertebrae:  No fracture, evidence of discitis, or bone lesion. Conus medullaris: Extends to the move level and appears normal. Paraspinal and other soft tissues: Negative. Disc levels: L1-2: No significant disc displacement, foraminal narrowing, or canal stenosis. L2-3: No significant disc displacement, foraminal narrowing, or canal stenosis. L3-4: Small disc bulge with mild foraminal narrowing. No significant canal stenosis. L4-5: Small disc bulge with mild foraminal narrowing. No significant canal stenosis.  L5-S1: Small disc bulge and mild facet hypertrophy on the right. Mild bilateral foraminal narrowing. No significant canal stenosis. IMPRESSION: 1. No cervical cord signal abnormality. No conus medullaris cord signal abnormality. 2. No acute osseous abnormality. No high-grade canal stenosis or foraminal narrowing of cervical and lumbar spine. 3. Mild cervical spondylosis greatest at the C3-4 level where a disc bulge and left-sided uncovertebral hypertrophy results in moderate  left foraminal narrowing. There is mild left-sided foraminal narrowing at the C2-3 and C4-5 levels. 4. Small disc bulges at the L3 through S1 levels with mild bilateral foraminal narrowing. Electronically Signed   By: Mitzi Hansen M.D.   On: 09/27/2016 15:01    ASSESSMENT AND PLAN:   Active Problems:   Valproic acid toxicity   Toxic metabolic encephalopathy   Leukopenia   Seizure disorder (HCC)   Toxic encephalopathy  #1. Toxic metabolic encephalopathy,   likely due to valproic acid toxicity as well as question with seizure episode, continue patient on Ativan as needed.   hold valproic acid,  ammonia level checked,    Appreciated neurology consult.   Currently on depakote 500 mg BID, lamictal 125 mg BID, keppra was 750 BID.   Pt had one seizure episode on 09/29/16 5 am- neurologist suggest to increase keppra dose 1000 mg bid. #2 valproic acid toxicity, hold valproic acid, check level in the morning- now came in safe range. #3. Leukopenia, likely related to valproic acid toxicity #4. History of seizure disorder with likely recurrent seizure, getting neurologist to review patient's medications #5 b/l lower extremity weakness     No spinal cord injuries or pathology.     Improving gradually, get PT eval.  All the records are reviewed and case discussed with Care Management/Social Workerr. Management plans discussed with the patient, family and they are in agreement.  CODE STATUS: full.  TOTAL TIME TAKING  CARE OF THIS PATIENT: 35 minutes.    POSSIBLE D/C IN 1-2 DAYS, DEPENDING ON CLINICAL CONDITION.   Altamese Dilling M.D on 09/29/2016   Between 7am to 6pm - Pager - [phone]  After 6pm go to [email or web address] - password [email or web address]  [facility]  Office  [phone]  CC: Primary care physician; Pcp Not In System  Note: This dictation was prepared with Dragon dictation along with smaller phrase technology. Any transcriptional errors that result from this process are unintentional.

## 2016-09-29 NOTE — Progress Notes (Addendum)
Subjective: Patient with a documented seizure this morning.  Continues to complain of lower extremity numbness and weakness, although improved.    Objective: Current vital signs: BP (!) 141/90   Pulse 66   Temp 97.6 F (36.4 C) (Oral)   Resp 16   Ht 6\' 2"  (1.88 m)   Wt 74.8 kg (165 lb)   SpO2 99%   BMI 21.18 kg/m  Vital signs in last 24 hours: Temp:  [97.5 F (36.4 C)-97.6 F (36.4 C)] 97.6 F (36.4 C) (01/13 0459) Pulse Rate:  [64-69] 66 (01/13 0540) Resp:  [16] 16 (01/13 0459) BP: (130-152)/(75-127) 141/90 (01/13 0540) SpO2:  [97 %-99 %] 99 % (01/13 0540)  Intake/Output from previous day: 01/12 0701 - 01/13 0700 In: 1678.3 [P.O.:480; I.V.:1198.3] Out: 1000 [Urine:1000] Intake/Output this shift: Total I/O In: 0  Out: 400 [Urine:400] Nutritional status: DIET SOFT Room service appropriate? Yes; Fluid consistency: Thin  Neurologic Exam: Mental Status: Alert, oriented, thought content appropriate.  Speech fluent without evidence of aphasia.  Able to follow 3 step commands without difficulty. Cranial Nerves: II: Discs flat bilaterally; Visual fields grossly normal, pupils equal, round, reactive to light and accommodation III,IV, VI: ptosis not present, extra-ocular motions intact bilaterally V,VII: smile symmetric, facial light touch sensation normal bilaterally VIII: hearing normal bilaterally IX,X: gag reflex present XI: bilateral shoulder shrug XII: midline tongue extension Motor: Right : Upper extremity   5/5    Left:     Upper extremity   5/5 Able to move both lower extremities although can not maintain off the bed.  (Was able to stand earlier today per his report) Sensory: Pinprick and light touch decreased to the knees   Lab Results: Basic Metabolic Panel:  Recent Labs Lab 09/26/16 0802 09/27/16 0440  NA 139 141  K 3.8 3.8  CL 104 106  CO2 29 30  GLUCOSE 91 88  BUN 8 10  CREATININE 1.24 1.17  CALCIUM 8.8* 8.9    Liver Function Tests:  Recent  Labs Lab 09/26/16 0802  AST 14*  ALT 9*  ALKPHOS 45  BILITOT 0.4  PROT 6.8  ALBUMIN 4.0   No results for input(s): LIPASE, AMYLASE in the last 168 hours.  Recent Labs Lab 09/26/16 1924  AMMONIA 58*    CBC:  Recent Labs Lab 09/26/16 0802 09/27/16 0440  WBC 3.2* 2.9*  HGB 13.4 13.9  HCT 38.7* 39.5*  MCV 85.8 85.3  PLT 237 222    Cardiac Enzymes: No results for input(s): CKTOTAL, CKMB, CKMBINDEX, TROPONINI in the last 168 hours.  Lipid Panel: No results for input(s): CHOL, TRIG, HDL, CHOLHDL, VLDL, LDLCALC in the last 168 hours.  CBG: No results for input(s): GLUCAP in the last 168 hours.  Microbiology: No results found for this or any previous visit.  Coagulation Studies: No results for input(s): LABPROT, INR in the last 72 hours.  Imaging: Mr Cervical Spine Wo Contrast  Result Date: 09/27/2016 CLINICAL DATA:  36 y/o M; 36 y/o M; history of seizures, inability to move legs, incontinence. EXAM: MRI CERVICAL AND LUMBAR SPINE WITHOUT CONTRAST TECHNIQUE: Multiplanar and multiecho pulse sequences of the cervical spine, to include the craniocervical junction and cervicothoracic junction, and lumbar spine, were obtained without intravenous contrast. COMPARISON:  09/26/2016 CT head. FINDINGS: MRI CERVICAL SPINE FINDINGS Alignment: Physiologic. Vertebrae: No fracture, evidence of discitis, or bone lesion. Cord: Normal signal and morphology. Posterior Fossa, vertebral arteries, paraspinal tissues: Negative. Disc levels: C2-3: Left-sided uncovertebral hypertrophy with mild left foraminal narrowing. No  significant canal stenosis. C3-4: Small disc bulge eccentric to the left with left-sided uncovertebral hypertrophy. Moderate left foraminal narrowing. No significant canal stenosis. C4-5: Small disc bulge and left-greater-than-right uncovertebral hypertrophy with mild left foraminal narrowing. No significant canal stenosis. C5-6: No significant disc displacement, foraminal narrowing,  or canal stenosis. C6-7: No significant disc displacement, foraminal narrowing, or canal stenosis. C7-T1: No significant disc displacement, foraminal narrowing, or canal stenosis. MRI LUMBAR SPINE FINDINGS Segmentation:  Standard. Alignment:  Physiologic. Vertebrae:  No fracture, evidence of discitis, or bone lesion. Conus medullaris: Extends to the move level and appears normal. Paraspinal and other soft tissues: Negative. Disc levels: L1-2: No significant disc displacement, foraminal narrowing, or canal stenosis. L2-3: No significant disc displacement, foraminal narrowing, or canal stenosis. L3-4: Small disc bulge with mild foraminal narrowing. No significant canal stenosis. L4-5: Small disc bulge with mild foraminal narrowing. No significant canal stenosis. L5-S1: Small disc bulge and mild facet hypertrophy on the right. Mild bilateral foraminal narrowing. No significant canal stenosis. IMPRESSION: 1. No cervical cord signal abnormality. No conus medullaris cord signal abnormality. 2. No acute osseous abnormality. No high-grade canal stenosis or foraminal narrowing of cervical and lumbar spine. 3. Mild cervical spondylosis greatest at the C3-4 level where a disc bulge and left-sided uncovertebral hypertrophy results in moderate left foraminal narrowing. There is mild left-sided foraminal narrowing at the C2-3 and C4-5 levels. 4. Small disc bulges at the L3 through S1 levels with mild bilateral foraminal narrowing. Electronically Signed   By: Mitzi Hansen M.D.   On: 09/27/2016 15:01   Mr Lumbar Spine Wo Contrast  Result Date: 09/27/2016 CLINICAL DATA:  36 y/o M; 36 y/o M; history of seizures, inability to move legs, incontinence. EXAM: MRI CERVICAL AND LUMBAR SPINE WITHOUT CONTRAST TECHNIQUE: Multiplanar and multiecho pulse sequences of the cervical spine, to include the craniocervical junction and cervicothoracic junction, and lumbar spine, were obtained without intravenous contrast. COMPARISON:   09/26/2016 CT head. FINDINGS: MRI CERVICAL SPINE FINDINGS Alignment: Physiologic. Vertebrae: No fracture, evidence of discitis, or bone lesion. Cord: Normal signal and morphology. Posterior Fossa, vertebral arteries, paraspinal tissues: Negative. Disc levels: C2-3: Left-sided uncovertebral hypertrophy with mild left foraminal narrowing. No significant canal stenosis. C3-4: Small disc bulge eccentric to the left with left-sided uncovertebral hypertrophy. Moderate left foraminal narrowing. No significant canal stenosis. C4-5: Small disc bulge and left-greater-than-right uncovertebral hypertrophy with mild left foraminal narrowing. No significant canal stenosis. C5-6: No significant disc displacement, foraminal narrowing, or canal stenosis. C6-7: No significant disc displacement, foraminal narrowing, or canal stenosis. C7-T1: No significant disc displacement, foraminal narrowing, or canal stenosis. MRI LUMBAR SPINE FINDINGS Segmentation:  Standard. Alignment:  Physiologic. Vertebrae:  No fracture, evidence of discitis, or bone lesion. Conus medullaris: Extends to the move level and appears normal. Paraspinal and other soft tissues: Negative. Disc levels: L1-2: No significant disc displacement, foraminal narrowing, or canal stenosis. L2-3: No significant disc displacement, foraminal narrowing, or canal stenosis. L3-4: Small disc bulge with mild foraminal narrowing. No significant canal stenosis. L4-5: Small disc bulge with mild foraminal narrowing. No significant canal stenosis. L5-S1: Small disc bulge and mild facet hypertrophy on the right. Mild bilateral foraminal narrowing. No significant canal stenosis. IMPRESSION: 1. No cervical cord signal abnormality. No conus medullaris cord signal abnormality. 2. No acute osseous abnormality. No high-grade canal stenosis or foraminal narrowing of cervical and lumbar spine. 3. Mild cervical spondylosis greatest at the C3-4 level where a disc bulge and left-sided uncovertebral  hypertrophy results in moderate left foraminal narrowing. There  is mild left-sided foraminal narrowing at the C2-3 and C4-5 levels. 4. Small disc bulges at the L3 through S1 levels with mild bilateral foraminal narrowing. Electronically Signed   By: Mitzi HansenLance  Furusawa-Stratton M.D.   On: 09/27/2016 15:01    Medications:  I have reviewed the patient's current medications. Scheduled: . divalproex  1,000 mg Oral BID  . enoxaparin (LOVENOX) injection  40 mg Subcutaneous Q24H  . famotidine  20 mg Oral BID  . lamoTRIgine  125 mg Oral BID  . levETIRAcetam  1,000 mg Oral BID  . mouth rinse  15 mL Mouth Rinse BID  . sodium chloride flush  3 mL Intravenous Q12H    Assessment/Plan: Patient with seizure this morning.  Numbness and weakness improved  Recommendations: 1.  Increase Keppra to 1000mg  BID 2.  PT   LOS: 3 days   Thana FarrLeslie Dynasty Holquin, MD Neurology [phone] 09/29/2016  1:11 PM

## 2016-09-29 NOTE — Progress Notes (Signed)
Pt pressed nurse call but no reply. Charge RN went to check on pt and he was found having seizure at 0521. Pt was found with arms contracted and tremors. Staff provided safety for patient.  Ativan PRN was given for seizure activity with quick resolve. Pt was able to speak and state he was ok. BP elevated 152/127 HR 69  No injuries noted.

## 2016-09-29 NOTE — Plan of Care (Signed)
Problem: Safety: Goal: Verbalization of understanding the information provided will improve Outcome: Progressing Pt is alert and oriented,on neuro checks q4h, ambulatory to bathroom, good appetite, updated provided to mother at bedside, on room air, IV fluids infusing, no s/s seizure activity during shift. Denies pain, resting in between care, likely to d/c on 1/14.

## 2016-09-29 NOTE — Progress Notes (Signed)
BP recheck 140/90.

## 2016-09-30 NOTE — Discharge Summary (Signed)
[facility] - [place] at [facility]   PATIENT NAME: Keith Chung    MR#:  [id number]  DATE OF BIRTH:  [birth year]  DATE OF ADMISSION:  09/26/2016 ADMITTING PHYSICIAN: Katharina Caper, MD  DATE OF DISCHARGE: 09/30/2016 left AMA.  PRIMARY CARE PHYSICIAN: Pcp Not In System    ADMISSION DIAGNOSIS:  Seizure (HCC) [R56.9]  DISCHARGE DIAGNOSIS:  Active Problems:   Valproic acid toxicity   Toxic metabolic encephalopathy   Leukopenia   Seizure disorder (HCC)   Toxic encephalopathy   SECONDARY DIAGNOSIS:   Past Medical History:  Diagnosis Date  . Learning disorder   . Seasonal allergies   . Seizures (HCC)     HOSPITAL COURSE:   #1. Toxic metabolic encephalopathy,   likely due to valproic acid toxicity as well as question with seizure episode, continue patient on Ativan as needed.   hold valproic acid,  ammonia level checked,    Appreciated neurology consult.   Currently on depakote 500 mg BID, lamictal 125 mg BID, keppra was 750 BID.   Pt had one seizure episode on 09/29/16 5 am- neurologist suggest to increase keppra dose 1000 mg bid. #2 valproic acid toxicity, hold valproic acid, check level in the morning- now came in safe range. #3. Leukopenia, likely related to valproic acid toxicity #4. History of seizure disorder with likely recurrent seizure, getting neurologist to review patient's medications #5 b/l lower extremity weakness     No spinal cord injuries or pathology.     Improving gradually, get PT eval.  On 09/30/16 morning- pt left against medical advise, early morning- before I can see him.  DISCHARGE CONDITIONS:   Stable as per nursing staff.  CONSULTS OBTAINED:  Treatment Team:  Thana Farr, MD  DRUG ALLERGIES:   Allergies  Allergen Reactions  . Pollen Extract Other (See Comments)    Runny nose, watery eyes, sneezing    DISCHARGE MEDICATIONS:   Current Discharge Medication List    CONTINUE these medications which have  NOT CHANGED   Details  lamoTRIgine (LAMICTAL) 100 MG tablet Take 100 mg by mouth 2 (two) times daily.    divalproex (DEPAKOTE) 500 MG DR tablet Take 1 tablet (500 mg total) by mouth 3 (three) times daily. Qty: 150 tablet, Refills: 1    famotidine (PEPCID) 20 MG tablet Take 1 tablet (20 mg total) by mouth 2 (two) times daily. Qty: 60 tablet, Refills: 0    ibuprofen (ADVIL,MOTRIN) 600 MG tablet Take 600 mg by mouth every 6 (six) hours as needed.    lamoTRIgine (LAMICTAL) 25 MG CHEW chewable tablet Chew 1 tablet by mouth 2 (two) times daily.     levETIRAcetam (KEPPRA) 500 MG tablet Take 1500 mg in am and 1500 mg in pm. Qty: 180 tablet, Refills: 1    oxyCODONE-acetaminophen (ROXICET) 5-325 MG tablet Take 1 tablet by mouth every 6 (six) hours as needed. Qty: 12 tablet, Refills: 0         DISCHARGE INSTRUCTIONS:    Not given any- as he left before seen.  If you experience worsening of your admission symptoms, develop shortness of breath, life threatening emergency, suicidal or homicidal thoughts you must seek medical attention immediately by calling 911 or calling your MD immediately  if symptoms less severe.  You Must read complete instructions/literature along with all the possible adverse reactions/side effects for all the Medicines you take and that have been prescribed to you. Take any new Medicines after you have completely understood and accept all  the possible adverse reactions/side effects.   Please note  You were cared for by a hospitalist during your hospital stay. If you have any questions about your discharge medications or the care you received while you were in the hospital after you are discharged, you can call the unit and asked to speak with the hospitalist on call if the hospitalist that took care of you is not available. Once you are discharged, your primary care physician will handle any further medical issues. Please note that NO REFILLS for any discharge  medications will be authorized once you are discharged, as it is imperative that you return to your primary care physician (or establish a relationship with a primary care physician if you do not have one) for your aftercare needs so that they can reassess your need for medications and monitor your lab values.    Today   CHIEF COMPLAINT:   Chief Complaint  Patient presents with  . Seizures    HISTORY OF PRESENT ILLNESS:  Keith Chung  is a 36 y.o. male with a known history of Seasonal allergies. Seizures, learning disorder, who presents to the hospital with altered mental status, very somnolent. Currently patient was found by police department in the hotel room. After someone called 911, and left, no further history was available, patient himself is altered and not able to provide review of systems, but admits OF thinking that he had seizure episodes. It has been that he felt suddenly very lightheaded and dizzy, and the was able to ambulate to the bathroom in bed and passed out. He thought that he had seizure. He thought that he also bit his tongue. His not able to provide any more history. On Police Department arrival, patient was given Narcan with no improvement and he was brought to emergency room for further evaluation and treatment, where he was given Ativan. He was still somnolent until 1 p.m., during my evaluation, he was able to open his eyes and briefly converses but not able to provide review of systems  VITAL SIGNS:  Blood pressure 131/79, pulse 70, temperature 97.8 F (36.6 C), temperature source Oral, resp. rate 18, height 6\' 2"  (1.88 m), weight 74.8 kg (165 lb), SpO2 98 %.  I/O:   Intake/Output Summary (Last 24 hours) at 09/30/16 0718 Last data filed at 09/30/16 0659  Gross per 24 hour  Intake          1688.33 ml  Output              900 ml  Net           788.33 ml    PHYSICAL EXAMINATION:   I did not see the pt on the day of discharge. He left AMA before  seen.  DATA REVIEW:   CBC  Recent Labs Lab 09/27/16 0440  WBC 2.9*  HGB 13.9  HCT 39.5*  PLT 222    Chemistries   Recent Labs Lab 09/26/16 0802 09/27/16 0440  NA 139 141  K 3.8 3.8  CL 104 106  CO2 29 30  GLUCOSE 91 88  BUN 8 10  CREATININE 1.24 1.17  CALCIUM 8.8* 8.9  AST 14*  --   ALT 9*  --   ALKPHOS 45  --   BILITOT 0.4  --     Cardiac Enzymes No results for input(s): TROPONINI in the last 168 hours.  Microbiology Results  No results found for this or any previous visit.  RADIOLOGY:  No results found.  EKG:   Orders placed or performed during the hospital encounter of 09/26/16  . EKG 12-Lead  . EKG 12-Lead      Management plans discussed with the patient, family and they are in agreement.  CODE STATUS:     Code Status Orders        Start     Ordered   09/26/16 2116  Full code  Continuous     09/26/16 2115    Code Status History    Date Active Date Inactive Code Status Order ID Comments User Context   05/09/2016  5:55 PM 05/13/2016  5:13 PM Full Code [id number]  Enedina Finner, MD Inpatient   04/18/2016  5:12 PM 04/19/2016  6:38 PM Full Code [id number]  Wyatt Haste, MD ED   10/16/2015  6:09 PM 10/18/2015  5:36 PM Full Code [id number]  Enid Baas, MD Inpatient      TOTAL TIME TAKING CARE OF THIS PATIENT: 15 minutes.    Altamese Dilling M.D on 09/30/2016 at 7:18 AM  Between 7am to 6pm - Pager - [phone]  After 6pm go to [email or web address] - password [email or web address]  [facility]  Office  [phone]  CC: Primary care physician; Pcp Not In System   Note: This dictation was prepared with Dragon dictation along with smaller phrase technology. Any transcriptional errors that result from this process are unintentional.

## 2016-09-30 NOTE — Progress Notes (Addendum)
Notified Dr. Elisabeth PigeonVachhani that patient has left AMA. Pt education provided concerning leaving AMA, pt continued to request to leave. Pt was cooperative in waiting on form to sign. After AMA form was completed patient left carrying his belongings. Pt did state that he had telephoned his mother and that she would be picking him up. Pt also stated that he had refills on his medications.

## 2016-10-16 ENCOUNTER — Emergency Department
Admission: EM | Admit: 2016-10-16 | Discharge: 2016-10-16 | Disposition: A | Discharge status: Home / Self Care | Payer: Medicaid Other | Attending: Emergency Medicine | Admitting: Emergency Medicine

## 2016-10-16 ENCOUNTER — Encounter: Payer: Self-pay | Admitting: Emergency Medicine

## 2016-10-16 DIAGNOSIS — Z87891 Personal history of nicotine dependence: Secondary | ICD-10-CM | POA: Diagnosis not present

## 2016-10-16 DIAGNOSIS — G40909 Epilepsy, unspecified, not intractable, without status epilepticus: Secondary | ICD-10-CM | POA: Diagnosis not present

## 2016-10-16 DIAGNOSIS — Z791 Long term (current) use of non-steroidal anti-inflammatories (NSAID): Secondary | ICD-10-CM | POA: Diagnosis not present

## 2016-10-16 DIAGNOSIS — Z79899 Other long term (current) drug therapy: Secondary | ICD-10-CM | POA: Insufficient documentation

## 2016-10-16 DIAGNOSIS — R569 Unspecified convulsions: Secondary | ICD-10-CM

## 2016-10-16 LAB — COMPREHENSIVE METABOLIC PANEL
ALBUMIN: 4.4 g/dL (ref 3.5–5.0)
ALT: 17 U/L (ref 17–63)
AST: 20 U/L (ref 15–41)
Alkaline Phosphatase: 49 U/L (ref 38–126)
Anion gap: 4 — ABNORMAL LOW (ref 5–15)
BILIRUBIN TOTAL: 0.5 mg/dL (ref 0.3–1.2)
BUN: 7 mg/dL (ref 6–20)
CO2: 30 mmol/L (ref 22–32)
Calcium: 8.9 mg/dL (ref 8.9–10.3)
Chloride: 106 mmol/L (ref 101–111)
Creatinine, Ser: 1.04 mg/dL (ref 0.61–1.24)
GFR calc Af Amer: >60 mL/min (ref >60)
GFR calc non Af Amer: >60 mL/min (ref >60)
Glucose, Bld: 96 mg/dL (ref 65–99)
POTASSIUM: 3.7 mmol/L (ref 3.5–5.1)
Sodium: 140 mmol/L (ref 135–145)
TOTAL PROTEIN: 7 g/dL (ref 6.5–8.1)

## 2016-10-16 LAB — CBC WITH DIFFERENTIAL/PLATELET
BASOS ABS: 0 10*3/uL (ref 0–0.1)
BASOS PCT: 1 %
Eosinophils Absolute: 0.2 10*3/uL (ref 0–0.7)
Eosinophils Relative: 7 %
HEMATOCRIT: 39.5 % — AB (ref 40.0–52.0)
HEMOGLOBIN: 13.7 g/dL (ref 13.0–18.0)
Lymphocytes Relative: 38 %
Lymphs Abs: 1.1 10*3/uL (ref 1.0–3.6)
MCH: 29.3 pg (ref 26.0–34.0)
MCHC: 34.6 g/dL (ref 32.0–36.0)
MCV: 84.7 fL (ref 80.0–100.0)
Monocytes Absolute: 0.3 10*3/uL (ref 0.2–1.0)
Monocytes Relative: 9 %
NEUTROS ABS: 1.4 10*3/uL (ref 1.4–6.5)
Neutrophils Relative %: 45 %
Platelets: 244 10*3/uL (ref 150–440)
RBC: 4.67 MIL/uL (ref 4.40–5.90)
RDW: 12 % (ref 11.5–14.5)
WBC: 3 10*3/uL — AB (ref 3.8–10.6)

## 2016-10-16 LAB — VALPROIC ACID LEVEL: Valproic Acid Lvl: <10 ug/mL — ABNORMAL LOW (ref 50.0–100.0)

## 2016-10-16 MED ORDER — SODIUM CHLORIDE 0.9 % IV SOLN
Freq: Once | INTRAVENOUS | Status: AC
  Administered 2016-10-16: 08:00:00 via INTRAVENOUS

## 2016-10-16 MED ORDER — LORAZEPAM 2 MG/ML IJ SOLN
1.0000 mg | Freq: Once | INTRAMUSCULAR | Status: AC
  Administered 2016-10-16: 1 mg via INTRAVENOUS
  Filled 2016-10-16: qty 1

## 2016-10-16 MED ORDER — DIVALPROEX SODIUM 500 MG PO DR TAB
500.0000 mg | DELAYED_RELEASE_TABLET | Freq: Once | ORAL | Status: AC
  Administered 2016-10-16: 500 mg via ORAL
  Filled 2016-10-16: qty 1

## 2016-10-16 MED ORDER — DIVALPROEX SODIUM 500 MG PO DR TAB
500.0000 mg | DELAYED_RELEASE_TABLET | Freq: Three times a day (TID) | ORAL | 6 refills | Status: DC
Start: 2016-10-16 — End: 2016-10-23

## 2016-10-16 NOTE — ED Provider Notes (Signed)
[facility] Emergency Department Provider Note        Time seen: ----------------------------------------- 8:03 AM on 10/16/2016 -----------------------------------------    I have reviewed the triage vital signs and the nursing notes.   HISTORY  Chief Complaint Seizures    HPI Keith Chung. is a 36 y.o. male who presents to the ER being brought by EMS for a seizure. Patient has been seen here recently for same. Patient was found unresponsive at a hotel room locally. Patient is now awake and responsive, states he feels dizzy. Patient states he has been taking his medications as prescribed but he went for several weeks without taking the medicine. Currently he is on Lamictal, Depakote and Keppra. He denies any other complaints at this time.   Past Medical History:  Diagnosis Date  . Learning disorder   . Seasonal allergies   . Seizures [facility])     Patient Active Problem List   Diagnosis Date Noted  . Toxic metabolic encephalopathy 09/26/2016  . Leukopenia 09/26/2016  . Seizure disorder (HCC) 09/26/2016  . Toxic encephalopathy 09/26/2016  . 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 Seizure (HCC) 04/18/2016  . Epilepsy (HCC) 10/17/2015    Past Surgical History:  Procedure Laterality Date  . HAND SURGERY     right hand surgery  . VASECTOMY Bilateral     Allergies Pollen extract  Social History Social History  Substance Use Topics  . Smoking status: Former Smoker    Packs/day: 0.30    Types: Cigarettes    Quit date: 07/18/2014  . Smokeless tobacco: Never Used     Comment: stopped 4 months ago  . Alcohol use No     Comment: socially    Review of Systems Constitutional: Negative for fever. Cardiovascular: Negative for chest pain. Respiratory: Negative for shortness of breath. Gastrointestinal: Negative for abdominal pain, vomiting and diarrhea. Musculoskeletal: Negative for back pain. Skin: Negative for rash. Neurological:  Negative for headaches, focal weakness or numbness.  10-point ROS otherwise negative.  ____________________________________________   PHYSICAL EXAM:  VITAL SIGNS: ED Triage Vitals  Enc Vitals Group     BP      Pulse      Resp      Temp      Temp src      SpO2      Weight      Height      Head Circumference      Peak Flow      Pain Score      Pain Loc      Pain Edu?      Excl. in GC?     Constitutional: Alert and oriented. Well appearing and in no distress. Eyes: Conjunctivae are normal. PERRL. Normal extraocular movements. ENT   Head: Normocephalic and atraumatic.   Nose: No congestion/rhinnorhea.   Mouth/Throat: Mucous membranes are moist.   Neck: No stridor. Cardiovascular: Normal rate, regular rhythm. No murmurs, rubs, or gallops. Respiratory: Normal respiratory effort without tachypnea nor retractions. Breath sounds are clear and equal bilaterally. No wheezes/rales/rhonchi. Gastrointestinal: Soft and nontender. Normal bowel sounds Musculoskeletal: Nontender with normal range of motion in all extremities. No lower extremity tenderness nor edema. Neurologic:  Normal speech and language. No gross focal neurologic deficits are appreciated.  Skin:  Skin is warm, dry and intact. No rash noted. Psychiatric: Mood and affect are normal. Speech and behavior are normal.  ____________________________________________  EKG: Interpreted by me. Sinus rhythm rate of 66 bpm, normal PR interval,  normal QRS, normal QT. Likely early re-pole pattern  ____________________________________________  ED COURSE:  Pertinent labs & imaging results that were available during my care of the patient were reviewed by me and considered in my medical decision making (see chart for details). Patient presents to the ER status post seizure. He is in no distress. We will check basic labs and reevaluate. Clinical Course as of Oct 16 1099  Tue Oct 16, 2016  1014 Patient now admits to  having run out of his Depakote as evidenced by his blood level being undetectable. We will give him oral Depakote.  [JW]  1015 He'll be given a prescription for Depakote and he is scheduled to see his neurologist today after lunch.  [JW]    Clinical Course User Index [JW] Emily FilbertJonathan E Salima Rumer, MD   Procedures ____________________________________________   LABS (pertinent positives/negatives)  Labs Reviewed  CBC WITH DIFFERENTIAL/PLATELET - Abnormal; Notable for the following:       Result Value   WBC 3.0 (*)    HCT 39.5 (*)    All other components within normal limits  COMPREHENSIVE METABOLIC PANEL - Abnormal; Notable for the following:    Anion gap 4 (*)    All other components within normal limits  VALPROIC ACID LEVEL - Abnormal; Notable for the following:    Valproic Acid Lvl <10 (*)    All other components within normal limits  URINE DRUG SCREEN, QUALITATIVE ([facility] ONLY)  LAMOTRIGINE LEVEL  LEVETIRACETAM LEVEL  ___________________________________________  FINAL ASSESSMENT AND PLAN  Seizure  Plan: Patient with labs as dictated above. Patient had a seizure today from noncompliance which is unfortunately a pattern for him. He does have follow-up today with his neurologist. He has been given oral Depakote and I'll prescribe more Depakote for him to take as an outpatient.   Emily FilbertWilliams, Bijon Mineer E, MD   Note: This note was generated in part or whole with voice recognition software. Voice recognition is usually quite accurate but there are transcription errors that can and very often do occur. I apologize for any typographical errors that were not detected and corrected.     Emily FilbertJonathan E Corben Auzenne, MD 10/16/16 1101

## 2016-10-16 NOTE — ED Triage Notes (Signed)
Pt arrived via EMS from a hotel for reports of seizure. EMS reports HR 67, 100% RA, CBG 112. Pt alert on arrival.

## 2016-10-16 NOTE — ED Notes (Signed)
Pt found standing beside bed with siderails up. Pt urinated in the floor. Pt states he could not get to the toilet in time. Pt given paper scrub pants to wear.

## 2016-10-17 LAB — LAMOTRIGINE LEVEL: LAMOTRIGINE LVL: 1.6 ug/mL — AB (ref 2.0–20.0)

## 2016-10-18 LAB — LEVETIRACETAM LEVEL: Levetiracetam Lvl: 11.4 ug/mL (ref 10.0–40.0)

## 2016-10-21 ENCOUNTER — Observation Stay
Admission: EM | Admit: 2016-10-21 | Discharge: 2016-10-23 | Disposition: A | Discharge status: Home / Self Care | Payer: Medicaid Other | Attending: Internal Medicine | Admitting: Internal Medicine

## 2016-10-21 ENCOUNTER — Encounter: Payer: Self-pay | Admitting: Emergency Medicine

## 2016-10-21 ENCOUNTER — Emergency Department: Payer: Medicaid Other

## 2016-10-21 DIAGNOSIS — E860 Dehydration: Secondary | ICD-10-CM | POA: Insufficient documentation

## 2016-10-21 DIAGNOSIS — R7989 Other specified abnormal findings of blood chemistry: Secondary | ICD-10-CM | POA: Insufficient documentation

## 2016-10-21 DIAGNOSIS — R531 Weakness: Principal | ICD-10-CM | POA: Insufficient documentation

## 2016-10-21 DIAGNOSIS — Z87891 Personal history of nicotine dependence: Secondary | ICD-10-CM | POA: Insufficient documentation

## 2016-10-21 DIAGNOSIS — Z9114 Patient's other noncompliance with medication regimen: Secondary | ICD-10-CM | POA: Diagnosis not present

## 2016-10-21 DIAGNOSIS — E876 Hypokalemia: Secondary | ICD-10-CM

## 2016-10-21 DIAGNOSIS — Z5181 Encounter for therapeutic drug level monitoring: Secondary | ICD-10-CM

## 2016-10-21 DIAGNOSIS — F819 Developmental disorder of scholastic skills, unspecified: Secondary | ICD-10-CM | POA: Insufficient documentation

## 2016-10-21 DIAGNOSIS — M6281 Muscle weakness (generalized): Secondary | ICD-10-CM

## 2016-10-21 DIAGNOSIS — Z79899 Other long term (current) drug therapy: Secondary | ICD-10-CM | POA: Diagnosis not present

## 2016-10-21 DIAGNOSIS — F1210 Cannabis abuse, uncomplicated: Secondary | ICD-10-CM | POA: Diagnosis not present

## 2016-10-21 DIAGNOSIS — G40909 Epilepsy, unspecified, not intractable, without status epilepticus: Secondary | ICD-10-CM | POA: Diagnosis not present

## 2016-10-21 DIAGNOSIS — D72819 Decreased white blood cell count, unspecified: Secondary | ICD-10-CM | POA: Insufficient documentation

## 2016-10-21 DIAGNOSIS — G8384 Todd's paralysis (postepileptic): Secondary | ICD-10-CM

## 2016-10-21 DIAGNOSIS — R262 Difficulty in walking, not elsewhere classified: Secondary | ICD-10-CM

## 2016-10-21 DIAGNOSIS — R569 Unspecified convulsions: Secondary | ICD-10-CM

## 2016-10-21 LAB — BASIC METABOLIC PANEL
ANION GAP: 7 (ref 5–15)
BUN: 7 mg/dL (ref 6–20)
CALCIUM: 9 mg/dL (ref 8.9–10.3)
CO2: 27 mmol/L (ref 22–32)
Chloride: 105 mmol/L (ref 101–111)
Creatinine, Ser: 1.26 mg/dL — ABNORMAL HIGH (ref 0.61–1.24)
GFR calc Af Amer: >60 mL/min (ref >60)
GFR calc non Af Amer: >60 mL/min (ref >60)
GLUCOSE: 79 mg/dL (ref 65–99)
POTASSIUM: 3.3 mmol/L — AB (ref 3.5–5.1)
SODIUM: 139 mmol/L (ref 135–145)

## 2016-10-21 LAB — CBC WITH DIFFERENTIAL/PLATELET
BASOS ABS: 0 10*3/uL (ref 0–0.1)
BASOS PCT: 1 %
EOS PCT: 2 %
Eosinophils Absolute: 0.1 10*3/uL (ref 0–0.7)
HCT: 38.2 % — ABNORMAL LOW (ref 40.0–52.0)
Hemoglobin: 13 g/dL (ref 13.0–18.0)
Lymphocytes Relative: 30 %
Lymphs Abs: 1 10*3/uL (ref 1.0–3.6)
MCH: 28.8 pg (ref 26.0–34.0)
MCHC: 34 g/dL (ref 32.0–36.0)
MCV: 84.6 fL (ref 80.0–100.0)
MONO ABS: 0.3 10*3/uL (ref 0.2–1.0)
Monocytes Relative: 8 %
Neutro Abs: 1.9 10*3/uL (ref 1.4–6.5)
Neutrophils Relative %: 59 %
Platelets: 222 10*3/uL (ref 150–440)
RBC: 4.51 MIL/uL (ref 4.40–5.90)
RDW: 12 % (ref 11.5–14.5)
WBC: 3.3 10*3/uL — AB (ref 3.8–10.6)

## 2016-10-21 LAB — URINE DRUG SCREEN, QUALITATIVE (ARMC ONLY)
Amphetamines, Ur Screen: NOT DETECTED
BARBITURATES, UR SCREEN: NOT DETECTED
Benzodiazepine, Ur Scrn: POSITIVE — AB
Cannabinoid 50 Ng, Ur ~~LOC~~: POSITIVE — AB
Cocaine Metabolite,Ur ~~LOC~~: NOT DETECTED
MDMA (Ecstasy)Ur Screen: NOT DETECTED
METHADONE SCREEN, URINE: NOT DETECTED
Opiate, Ur Screen: NOT DETECTED
PHENCYCLIDINE (PCP) UR S: NOT DETECTED
Tricyclic, Ur Screen: NOT DETECTED

## 2016-10-21 LAB — URINALYSIS, COMPLETE (UACMP) WITH MICROSCOPIC
BACTERIA UA: NONE SEEN
Bilirubin Urine: NEGATIVE
Glucose, UA: NEGATIVE mg/dL
HGB URINE DIPSTICK: NEGATIVE
Ketones, ur: 20 mg/dL — AB
Leukocytes, UA: NEGATIVE
NITRITE: NEGATIVE
Protein, ur: NEGATIVE mg/dL
SPECIFIC GRAVITY, URINE: 1.018 (ref 1.005–1.030)
Squamous Epithelial / LPF: NONE SEEN
pH: 6 (ref 5.0–8.0)

## 2016-10-21 LAB — MAGNESIUM: Magnesium: 2.1 mg/dL (ref 1.7–2.4)

## 2016-10-21 LAB — VALPROIC ACID LEVEL: Valproic Acid Lvl: 44 ug/mL — ABNORMAL LOW (ref 50.0–100.0)

## 2016-10-21 MED ORDER — ENOXAPARIN SODIUM 40 MG/0.4ML ~~LOC~~ SOLN
40.0000 mg | SUBCUTANEOUS | Status: DC
  Administered 2016-10-21: 40 mg via SUBCUTANEOUS
  Filled 2016-10-21 (×2): qty 0.4

## 2016-10-21 MED ORDER — ACETAMINOPHEN 650 MG RE SUPP: 650.0000 mg | Freq: Four times a day (QID) | RECTAL | Status: DC | PRN

## 2016-10-21 MED ORDER — LEVETIRACETAM 500 MG PO TABS
1000.0000 mg | ORAL_TABLET | Freq: Two times a day (BID) | ORAL | Status: DC
  Administered 2016-10-21 – 2016-10-23 (×4): 1000 mg via ORAL
  Filled 2016-10-21 (×4): qty 2

## 2016-10-21 MED ORDER — DIVALPROEX SODIUM 500 MG PO DR TAB
500.0000 mg | DELAYED_RELEASE_TABLET | Freq: Two times a day (BID) | ORAL | Status: DC
  Administered 2016-10-21 – 2016-10-23 (×4): 500 mg via ORAL
  Filled 2016-10-21 (×4): qty 1

## 2016-10-21 MED ORDER — SODIUM CHLORIDE 0.9 % IV SOLN
1000.0000 mg | Freq: Two times a day (BID) | INTRAVENOUS | Status: DC
  Administered 2016-10-21: 1000 mg via INTRAVENOUS
  Filled 2016-10-21 (×3): qty 10

## 2016-10-21 MED ORDER — SODIUM CHLORIDE 0.9 % IV BOLUS (SEPSIS)
1000.0000 mL | Freq: Once | INTRAVENOUS | Status: AC
  Administered 2016-10-21: 1000 mL via INTRAVENOUS

## 2016-10-21 MED ORDER — LAMOTRIGINE 25 MG PO TABS
125.0000 mg | ORAL_TABLET | Freq: Two times a day (BID) | ORAL | Status: DC
  Administered 2016-10-21 – 2016-10-22 (×2): 125 mg via ORAL
  Filled 2016-10-21 (×3): qty 1

## 2016-10-21 MED ORDER — LAMOTRIGINE 100 MG PO TABS: 100.0000 mg | ORAL_TABLET | Freq: Every day | ORAL | Status: DC

## 2016-10-21 MED ORDER — DEXTROSE 5 % IV SOLN
500.0000 mg | Freq: Once | INTRAVENOUS | Status: AC
  Administered 2016-10-21: 500 mg via INTRAVENOUS
  Filled 2016-10-21: qty 5

## 2016-10-21 MED ORDER — ACETAMINOPHEN 325 MG PO TABS
650.0000 mg | ORAL_TABLET | Freq: Four times a day (QID) | ORAL | Status: DC | PRN
  Administered 2016-10-22: 650 mg via ORAL
  Filled 2016-10-21: qty 2

## 2016-10-21 MED ORDER — IBUPROFEN 600 MG PO TABS
600.0000 mg | ORAL_TABLET | Freq: Four times a day (QID) | ORAL | Status: DC | PRN
  Administered 2016-10-22: 13:00:00 600 mg via ORAL
  Filled 2016-10-21: qty 1

## 2016-10-21 MED ORDER — POTASSIUM CHLORIDE CRYS ER 20 MEQ PO TBCR
40.0000 meq | EXTENDED_RELEASE_TABLET | Freq: Once | ORAL | Status: AC
  Administered 2016-10-21: 40 meq via ORAL
  Filled 2016-10-21: qty 2

## 2016-10-21 NOTE — ED Triage Notes (Signed)
[place] [facility] [place]. Patient sustained one seizure prior to EMS activation. Patient sustained another seizure upon Fire arrival, and three more for EMS. For a total of 4 in one hour. EMS administered IM Versed (5mg ). Patient arrives lethargic but able to answer questions appropriately. Patient states that he noticed his aura prior to the events. Patient also states that his MD is 'altering his medications because he found a spot on my brain'

## 2016-10-21 NOTE — ED Provider Notes (Signed)
[facility] Emergency Department Provider Note  ____________________________________________   First MD Initiated Contact with Patient 10/21/16 1354     (approximate)  I have reviewed the triage vital signs and the nursing notes.   HISTORY  Chief Complaint Seizures   HPI Keith Chung. is a 36 y.o. male with a history of a seizure disorder who recently had his medications changed within the past week was presenting with seizure 4. EMS said that he sees for roughly 5-10 minutes for them and they gave him IM Versed, 4 mg. He is denying any pain at this time. Says that he feels weak, but the weakness is generalized and without any focality. Denies any pain.   Past Medical History:  Diagnosis Date  . Learning disorder   . Seasonal allergies   . Seizures [facility])     Patient Active Problem List   Diagnosis Date Noted  . Toxic metabolic encephalopathy 09/26/2016  . Leukopenia 09/26/2016  . Seizure disorder (HCC) 09/26/2016  . Toxic encephalopathy 09/26/2016  . 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 Seizure (HCC) 04/18/2016  . Epilepsy (HCC) 10/17/2015    Past Surgical History:  Procedure Laterality Date  . HAND SURGERY     right hand surgery  . VASECTOMY Bilateral     Prior to Admission medications   Medication Sig Start Date End Date Taking? Authorizing Provider  divalproex (DEPAKOTE) 500 MG DR tablet Take 1 tablet (500 mg total) by mouth 3 (three) times daily. Patient taking differently: Take 500 mg by mouth 2 (two) times daily.  05/13/16   Houston Siren, MD  divalproex (DEPAKOTE) 500 MG DR tablet Take 1 tablet (500 mg total) by mouth 3 (three) times daily. 10/16/16   Emily Filbert, MD  famotidine (PEPCID) 20 MG tablet Take 1 tablet (20 mg total) by mouth 2 (two) times daily. 09/17/16   Sharman Cheek, MD  ibuprofen (ADVIL,MOTRIN) 600 MG tablet Take 600 mg by mouth every 6 (six) hours as needed.    Historical Provider, MD  lamoTRIgine  (LAMICTAL) 100 MG tablet Take 100 mg by mouth 2 (two) times daily.    Historical Provider, MD  lamoTRIgine (LAMICTAL) 25 MG CHEW chewable tablet Chew 1 tablet by mouth 2 (two) times daily.     Historical Provider, MD  levETIRAcetam (KEPPRA) 500 MG tablet Take 1500 mg in am and 1500 mg in pm. Patient taking differently: Take 500 mg by mouth 2 (two) times daily.  05/08/16   Levert Feinstein, MD  oxyCODONE-acetaminophen (ROXICET) 5-325 MG tablet Take 1 tablet by mouth every 6 (six) hours as needed. Patient not taking: Reported on 09/17/2016 07/08/16   Rebecka Apley, MD    Allergies Pollen extract  Family History  Problem Relation Age of Onset  . Cancer Father     Unsure of type  . Hypertension Father   . Hypertension Mother   . Diabetes Mellitus II Maternal Grandmother     Social History Social History  Substance Use Topics  . Smoking status: Former Smoker    Packs/day: 0.30    Types: Cigarettes    Quit date: 07/18/2014  . Smokeless tobacco: Never Used     Comment: stopped 4 months ago  . Alcohol use No     Comment: socially    Review of Systems Constitutional: No fever/chills Eyes: No visual changes. ENT: No sore throat. Cardiovascular: Denies chest pain. Respiratory: Denies shortness of breath. Gastrointestinal: No abdominal pain.  No nausea, no vomiting.  No diarrhea.  No constipation. Genitourinary: Negative for dysuria. Musculoskeletal: Negative for back pain. Skin: Negative for rash. Neurological: Negative for headaches.    10-point ROS otherwise negative.  ____________________________________________   PHYSICAL EXAM:  VITAL SIGNS: ED Triage Vitals  Enc Vitals Group     BP 10/21/16 1416 (!) 130/91     Pulse Rate 10/21/16 1416 65     Resp 10/21/16 1416 16     Temp 10/21/16 1416 98 F (36.7 C)     Temp Source 10/21/16 1416 Oral     SpO2 10/21/16 1416 98 %     Weight 10/21/16 1417 165 lb (74.8 kg)     Height 10/21/16 1417 6\' 2"  (1.88 m)     Head Circumference  --      Peak Flow --      Pain Score 10/21/16 1417 0     Pain Loc --      Pain Edu? --      Excl. in GC? --     Constitutional: Alert and oriented. in no acute distress. Eyes: Conjunctivae are normal. PERRL. EOMI. Head: Atraumatic. Nose: No congestion/rhinnorhea. Mouth/Throat: Mucous membranes are moist.   Neck: No stridor.   Cardiovascular: Normal rate, regular rhythm. Grossly normal heart sounds.   Respiratory: Normal respiratory effort.  No retractions. Lungs CTAB. Gastrointestinal: Soft and nontender. No distention.  Musculoskeletal: No lower extremity tenderness nor edema.  No joint effusions. Neurologic:  Normal speech and language.  Mild right-sided weakness when compared to the left. Patient says that he is also numb on the right side but he seems to have sensation to light touch. Skin:  Skin is warm, dry and intact. No rash noted. Psychiatric: Mood and affect are normal. Speech and behavior are normal.  ____________________________________________   LABS (all labs ordered are listed, but only abnormal results are displayed)  Labs Reviewed  CBC WITH DIFFERENTIAL/PLATELET - Abnormal; Notable for the following:       Result Value   WBC 3.3 (*)    HCT 38.2 (*)    All other components within normal limits  BASIC METABOLIC PANEL - Abnormal; Notable for the following:    Potassium 3.3 (*)    Creatinine, Ser 1.26 (*)    All other components within normal limits  VALPROIC ACID LEVEL - Abnormal; Notable for the following:    Valproic Acid Lvl 44 (*)    All other components within normal limits  URINALYSIS, COMPLETE (UACMP) WITH MICROSCOPIC   ____________________________________________  EKG   ____________________________________________  RADIOLOGY    DG Chest 1 View (Final result)  Result time 10/21/16 14:26:09  Final result by Waldo Laineavid M Williams III, MD (10/21/16 14:26:09)           Narrative:   CLINICAL DATA: Seizure.  EXAM: CHEST 1 VIEW  COMPARISON:  July 08, 2016  FINDINGS: The heart size and mediastinal contours are within normal limits. Both lungs are clear. The visualized skeletal structures are unremarkable.  IMPRESSION: No active disease.   Electronically Signed By: Gerome Samavid Williams III M.D On: 10/21/2016 14:26            CT Head Wo Contrast (Final result)  Result time 10/21/16 14:26:14  Final result by Audry RilesJonathan M Watts, MD (10/21/16 14:26:14)           Narrative:   CLINICAL DATA: Seizure.  EXAM: CT HEAD WITHOUT CONTRAST  TECHNIQUE: Contiguous axial images were obtained from the base of the skull through the vertex without intravenous contrast.  COMPARISON:  09/26/2016. Brain MRI 10/21/2015  FINDINGS: Brain: No evidence of acute infarction, hemorrhage, hydrocephalus, extra-axial collection or mass lesion/mass effect. Asymmetric greater right temporal horn ventricular volume described on previous brain MRI is again noted -nonspecific.  Vascular: Negative  Skull: Negative  Sinuses/Orbits: Negative  IMPRESSION: No acute or interval finding.   Electronically Signed By: Marnee Spring M.D. On: 10/21/2016 14:26            ____________________________________________   PROCEDURES  Procedure(s) performed:   Procedures  Critical Care performed:   ____________________________________________   INITIAL IMPRESSION / ASSESSMENT AND PLAN / ED COURSE  Pertinent labs & imaging results that were available during my care of the patient were reviewed by me and considered in my medical decision making (see chart for details).  ----------------------------------------- 4:23 PM on 10/21/2016 -----------------------------------------  Spoke with Dr. Loretha Brasil who recommends a 500 mg bolus of Depakote and then increasing the patient's evening dose 750 mg of Depakote.  Patient still with persistent right-sided weakness and numbness. He is able to move the right side but only  with a 2-3 out of 5 strength persistently. He says that he has had this sort of event after seizure in the past. He will be admitted to the hospital. Signed out to Dr. Judithann Sheen.      ____________________________________________   FINAL CLINICAL IMPRESSION(S) / ED DIAGNOSES  Seizure. Todd's paralysis.    NEW MEDICATIONS STARTED DURING THIS VISIT:  New Prescriptions   No medications on file     Note:  This document was prepared using Dragon voice recognition software and may include unintentional dictation errors.    Myrna Blazer, MD 10/21/16 [phone]

## 2016-10-21 NOTE — H&P (Signed)
[facility] [place] [facility]   PATIENT NAME: Keith Chung    MR#:  [id number]  DATE OF BIRTH:  1980/12/14  DATE OF ADMISSION:  10/21/2016  PRIMARY CARE PHYSICIAN: [facility]   REQUESTING/REFERRING PHYSICIAN: Dr Lorette Ang  CHIEF COMPLAINT:   Chief Complaint  Patient presents with  . Seizures    HISTORY OF PRESENT ILLNESS:  Keith Chung  is a 36 y.o. male with a known history of Seizure disorder presents with right-sided numbness and weakness. He states he was recently started on Lamictal and also taking Keppra and Depakote. Patient thinks he had a seizure but is not the best historian. No loss of bowel or bladder function with these seizures. EMS reported a seizure as per the ER physician. Hospitalist services were contacted for further evaluation. ER physician spoke with Dr. Loretha Brasil neurology and IV Keppra and Depakote ordered.  PAST MEDICAL HISTORY:   Past Medical History:  Diagnosis Date  . Learning disorder   . Seasonal allergies   . Seizures (HCC)     PAST SURGICAL HISTORY:   Past Surgical History:  Procedure Laterality Date  . HAND SURGERY     right hand surgery  . VASECTOMY Bilateral     SOCIAL HISTORY:   Social History  Substance Use Topics  . Smoking status: Former Smoker    Packs/day: 0.30    Types: Cigarettes    Quit date: 07/18/2014  . Smokeless tobacco: Never Used     Comment: stopped 4 months ago  . Alcohol use No     Comment: socially    FAMILY HISTORY:   Family History  Problem Relation Age of Onset  . Cancer Father     Unsure of type  . Hypertension Father   . Hypertension Mother   . Diabetes Mellitus II Maternal Grandmother     DRUG ALLERGIES:   Allergies  Allergen Reactions  . Pollen Extract Other (See Comments)    Runny nose, watery eyes, sneezing    REVIEW OF SYSTEMS:  CONSTITUTIONAL: No fever. Positive for cold chills. Right-sided weakness for 2 days.  EYES: No blurred  or double vision. Wears glasses. He feels like sometimes his eyes get stuck in one position EARS, NOSE, AND THROAT: No tinnitus or ear pain. No sore throat RESPIRATORY: No cough, shortness of breath, wheezing or hemoptysis.  CARDIOVASCULAR: Positive for chest pain. No orthopnea, edema.  GASTROINTESTINAL: No nausea. Positive for vomiting. No diarrhea or abdominal pain. No blood in bowel movements GENITOURINARY: No dysuria, hematuria.  ENDOCRINE: No polyuria, nocturia,  HEMATOLOGY: No anemia, easy bruising or bleeding SKIN: Some itching. MUSCULOSKELETAL: No joint pain or arthritis.   NEUROLOGIC: No tingling, numbness, weakness.  PSYCHIATRY: No anxiety or depression.   MEDICATIONS AT HOME:   Prior to Admission medications   Medication Sig Start Date End Date Taking? Authorizing Provider  divalproex (DEPAKOTE) 500 MG DR tablet Take 1 tablet (500 mg total) by mouth 3 (three) times daily. Patient taking differently: Take 500 mg by mouth 2 (two) times daily.  10/16/16  Yes Emily Filbert, MD  ibuprofen (ADVIL,MOTRIN) 600 MG tablet Take 600 mg by mouth every 6 (six) hours as needed.   Yes Historical Provider, MD  lamoTRIgine (LAMICTAL) 100 MG tablet Take 100 mg by mouth daily.    Yes Historical Provider, MD  lamoTRIgine (LAMICTAL) 25 MG tablet Take 25 mg by mouth daily.    Yes Historical Provider, MD  levETIRAcetam (KEPPRA) 500 MG tablet Take 1500 mg  in am and 1500 mg in pm. Patient taking differently: Take 500 mg by mouth 2 (two) times daily.  05/08/16  Yes Levert Feinstein, MD  famotidine (PEPCID) 20 MG tablet Take 1 tablet (20 mg total) by mouth 2 (two) times daily. 09/17/16   Sharman Cheek, MD  oxyCODONE-acetaminophen (ROXICET) 5-325 MG tablet Take 1 tablet by mouth every 6 (six) hours as needed. Patient not taking: Reported on 09/17/2016 07/08/16   Rebecka Apley, MD    Medication reconciliation still undergoing  VITAL SIGNS:  Blood pressure (!) 130/91, pulse 65, temperature 98 F (36.7  C), temperature source Oral, resp. rate 16, height 6\' 2"  (1.88 m), weight 74.8 kg (165 lb), SpO2 98 %.  PHYSICAL EXAMINATION:  GENERAL:  36 y.o.-year-old patient lying in the bed with no acute distress.  EYES: Pupils equal, round, reactive to light and accommodation. No scleral icterus. Extraocular muscles intact.  HEENT: Head atraumatic, normocephalic. Oropharynx and nasopharynx clear.  NECK:  Supple, no jugular venous distention. No thyroid enlargement, no tenderness.  LUNGS: Normal breath sounds bilaterally, no wheezing, rales,rhonchi or crepitation. No use of accessory muscles of respiration.  CARDIOVASCULAR: S1, S2 normal. No murmurs, rubs, or gallops.  ABDOMEN: Soft, nontender, nondistended. Bowel sounds present. No organomegaly or mass.  EXTREMITIES: No pedal edema, cyanosis, or clubbing.  NEUROLOGIC: Cranial nerves II through XII are intact. Muscle strength 5/5 in all extremities (poor effort with right side). Patient is able to hold his right leg up in the air after I let go but unable to lift it up there on his own. Patient able to pull himself sitting up position using his right arm. Sensation intact. Gait not checked.  PSYCHIATRIC: The patient is alert and oriented x 3.  SKIN: No rash, lesion, or ulcer.   LABORATORY PANEL:   CBC  Recent Labs Lab 10/21/16 1400  WBC 3.3*  HGB 13.0  HCT 38.2*  PLT 222   ------------------------------------------------------------------------------------------------------------------  Chemistries   Recent Labs Lab 10/16/16 0804 10/21/16 1400  NA 140 139  K 3.7 3.3*  CL 106 105  CO2 30 27  GLUCOSE 96 79  BUN 7 7  CREATININE 1.04 1.26*  CALCIUM 8.9 9.0  AST 20  --   ALT 17  --   ALKPHOS 49  --   BILITOT 0.5  --    ------------------------------------------------------------------------------------------------------------------    RADIOLOGY:  Dg Chest 1 View  Result Date: 10/21/2016 CLINICAL DATA:  Seizure. EXAM: CHEST 1  VIEW COMPARISON:  July 08, 2016 FINDINGS: The heart size and mediastinal contours are within normal limits. Both lungs are clear. The visualized skeletal structures are unremarkable. IMPRESSION: No active disease. Electronically Signed   By: Gerome Sam III M.D   On: 10/21/2016 14:26   Ct Head Wo Contrast  Result Date: 10/21/2016 CLINICAL DATA:  Seizure. EXAM: CT HEAD WITHOUT CONTRAST TECHNIQUE: Contiguous axial images were obtained from the base of the skull through the vertex without intravenous contrast. COMPARISON:  09/26/2016.  Brain MRI 10/21/2015 FINDINGS: Brain: No evidence of acute infarction, hemorrhage, hydrocephalus, extra-axial collection or mass lesion/mass effect. Asymmetric greater right temporal horn ventricular volume described on previous brain MRI is again noted -nonspecific. Vascular: Negative Skull: Negative Sinuses/Orbits: Negative IMPRESSION: No acute or interval finding. Electronically Signed   By: Marnee Spring M.D.   On: 10/21/2016 14:26     IMPRESSION AND PLAN:   1.  Seizure with history of seizure disorder. Check levels of all of his medications to check compliance. IV Keppra  and Depakote ordered in the ER. Continue usual medications. Neurology consultation. Check EEG in the morning. Check a urine toxicology 2. Weakness on the right side. I think this is more subjective and poor effort. Get physical therapy evaluation. Patient had imaging studies including MRI of the cervical spine and back just last month. I will hold off on further imaging until neurology evaluates. 3. Hypokalemia replace potassium orally. Check a magnesium. 4. Dehydration give IV fluid bolus 5. Leukopenia. This seems chronic, looking back at old labs.    All the records are reviewed and case discussed with ED provider. Management plans discussed with the patient, family and they are in agreement.  CODE STATUS: Full code  TOTAL TIME TAKING CARE OF THIS PATIENT: 50 minutes.    Alford HighlandWIETING,  Suhayla Chisom M.D on 10/21/2016 at 4:33 PM  Between 7am to 6pm - Pager - [phone]  After 6pm call admission pager [phone]  [facility] Office  [phone]  CC: Primary care physician; [facility]

## 2016-10-22 ENCOUNTER — Observation Stay: Payer: Medicaid Other

## 2016-10-22 DIAGNOSIS — R569 Unspecified convulsions: Secondary | ICD-10-CM

## 2016-10-22 LAB — BASIC METABOLIC PANEL
Anion gap: 4 — ABNORMAL LOW (ref 5–15)
BUN: 10 mg/dL (ref 6–20)
CALCIUM: 8.6 mg/dL — AB (ref 8.9–10.3)
CO2: 28 mmol/L (ref 22–32)
CREATININE: 1.12 mg/dL (ref 0.61–1.24)
Chloride: 108 mmol/L (ref 101–111)
GFR calc Af Amer: >60 mL/min (ref >60)
GFR calc non Af Amer: >60 mL/min (ref >60)
GLUCOSE: 90 mg/dL (ref 65–99)
Potassium: 3.9 mmol/L (ref 3.5–5.1)
Sodium: 140 mmol/L (ref 135–145)

## 2016-10-22 LAB — CBC
HCT: 38 % — ABNORMAL LOW (ref 40.0–52.0)
Hemoglobin: 13.2 g/dL (ref 13.0–18.0)
MCH: 29.4 pg (ref 26.0–34.0)
MCHC: 34.8 g/dL (ref 32.0–36.0)
MCV: 84.5 fL (ref 80.0–100.0)
PLATELETS: 206 10*3/uL (ref 150–440)
RBC: 4.49 MIL/uL (ref 4.40–5.90)
RDW: 12 % (ref 11.5–14.5)
WBC: 3 10*3/uL — ABNORMAL LOW (ref 3.8–10.6)

## 2016-10-22 MED ORDER — LAMOTRIGINE 25 MG PO TABS
125.0000 mg | ORAL_TABLET | Freq: Every day | ORAL | Status: DC
  Administered 2016-10-23: 09:00:00 125 mg via ORAL
  Filled 2016-10-22: qty 1

## 2016-10-22 NOTE — Care Management (Signed)
Admitted to [facility] with diagnosis of seizures under observation status. Lives with parents. Mother is Melburn PopperShelia Snipes [phone]). Last seen Dr. Theora MasterZachary Potter 10/16/16.  Physical therapy evaluation completed. Recommending Outpatient services or [facility]. Gwenette GreetBrenda S Donnica Jarnagin RN MSN CCM Care Management

## 2016-10-22 NOTE — Plan of Care (Signed)
Problem: Safety: Goal: Ability to remain free from injury will improve Outcome: Progressing Noted to have no falls, injury and seizure episode thus far. Rest and comforts maintained. Fall and seizure precautions in place. Needs attended.

## 2016-10-22 NOTE — Consult Note (Signed)
Reason for Consult:Seizures Referring Physician: Winona Legato  CC: Seizures  HPI: Keith Chung. is an 36 y.o. male with a history of seizures well known to this facility.  Has a history of noncompliance.  Followed by Dr. Malvin Johns on Lamictal, Depakote and Keppra.  Reports that he was at church yesterday and felt itching.  Was told that this may be a side effect of his medications so he called EMS.  Per EMS patient had a seizure prior to their arrival and one en route.  Patient received Versed.    Past Medical History:  Diagnosis Date  . Learning disorder   . Seasonal allergies   . Seizures (HCC)     Past Surgical History:  Procedure Laterality Date  . HAND SURGERY     right hand surgery  . VASECTOMY Bilateral     Family History  Problem Relation Age of Onset  . Cancer Father     Unsure of type  . Hypertension Father   . Hypertension Mother   . Diabetes Mellitus II Maternal Grandmother     Social History:  reports that he quit smoking about 2 years ago. His smoking use included Cigarettes. He smoked 0.30 packs per day. He has never used smokeless tobacco. He reports that he does not drink alcohol or use drugs.  Allergies  Allergen Reactions  . Pollen Extract Other (See Comments)    Runny nose, watery eyes, sneezing    Medications:  I have reviewed the patient's current medications. Prior to Admission:  Prescriptions Prior to Admission  Medication Sig Dispense Refill Last Dose  . divalproex (DEPAKOTE) 500 MG DR tablet Take 1 tablet (500 mg total) by mouth 3 (three) times daily. (Patient taking differently: Take 500 mg by mouth 2 (two) times daily. ) 90 tablet 6 10/21/2016 at am  . ibuprofen (ADVIL,MOTRIN) 600 MG tablet Take 600 mg by mouth every 6 (six) hours as needed.   prn at prn  . lamoTRIgine (LAMICTAL) 100 MG tablet Take 100 mg by mouth daily.    10/21/2016 at am  . lamoTRIgine (LAMICTAL) 25 MG tablet Take 25 mg by mouth daily.    10/21/2016 at am  . levETIRAcetam  (KEPPRA) 500 MG tablet Take 1500 mg in am and 1500 mg in pm. (Patient taking differently: Take 500 mg by mouth 2 (two) times daily. ) 180 tablet 1 unknown at unknown   Scheduled: . divalproex  500 mg Oral BID AC & HS  . enoxaparin (LOVENOX) injection  40 mg Subcutaneous Q24H  . lamoTRIgine  125 mg Oral BID  . levETIRAcetam  1,000 mg Oral BID    ROS: History obtained from the patient  General ROS: negative for - chills, fatigue, fever, night sweats, weight gain or weight loss Psychological ROS: negative for - behavioral disorder, hallucinations, memory difficulties, mood swings or suicidal ideation Ophthalmic ROS: negative for - blurry vision, double vision, eye pain or loss of vision ENT ROS: dizziness Allergy and Immunology ROS: negative for - hives or itchy/watery eyes Hematological and Lymphatic ROS: negative for - bleeding problems, bruising or swollen lymph nodes Endocrine ROS: negative for - galactorrhea, hair pattern changes, polydipsia/polyuria or temperature intolerance Respiratory ROS: negative for - cough, hemoptysis, shortness of breath or wheezing Cardiovascular ROS: negative for - chest pain, dyspnea on exertion, edema or irregular heartbeat Gastrointestinal ROS: negative for - abdominal pain, diarrhea, hematemesis, nausea/vomiting or stool incontinence Genito-Urinary ROS: negative for - dysuria, hematuria, incontinence or urinary frequency/urgency Musculoskeletal ROS: negative for -  joint swelling or muscular weakness Neurological ROS: as noted in HPI Dermatological ROS: itching, negative for rash  Physical Examination: Blood pressure 121/65, pulse 70, temperature 98.4 F (36.9 C), temperature source Oral, resp. rate 18, height 6\' 2"  (1.88 m), weight 74.8 kg (165 lb), SpO2 98 %.  HEENT-  Normocephalic, no lesions, without obvious abnormality.  Normal external eye and conjunctiva.  Normal TM's bilaterally.  Normal auditory canals and external ears. Normal external nose,  mucus membranes and septum.  Normal pharynx. Cardiovascular- S1, S2 normal, pulses palpable throughout   Lungs- chest clear, no wheezing, rales, normal symmetric air entry Abdomen- soft, non-tender; bowel sounds normal; no masses,  no organomegaly Extremities- no edema Lymph-no adenopathy palpable Musculoskeletal-no joint tenderness, deformity or swelling Skin-warm and dry, no hyperpigmentation, vitiligo, or suspicious lesions  Neurological Examination Mental Status: Alert, oriented, thought content appropriate.  Speech fluent without evidence of aphasia.  Able to follow 3 step commands without difficulty. Cranial Nerves: II: Discs flat bilaterally; Visual fields grossly normal, pupils equal, round, reactive to light and accommodation III,IV, VI: ptosis not present, extra-ocular motions intact bilaterally V,VII: smile symmetric, facial light touch sensation normal bilaterally VIII: hearing normal bilaterally IX,X: gag reflex present XI: bilateral shoulder shrug XII: midline tongue extension Motor: Right : Upper extremity   5/5    Left:     Upper extremity   5/5  Lower extremity   0/5     Lower extremity   5/5 Tone and bulk:normal tone throughout; no atrophy noted Sensory: Decreased pinprick and light touch in the RLE below the knee Deep Tendon Reflexes: 2+ and symmetric throughout Plantars: Right: downgoing   Left: downgoing Cerebellar: Normal finger-to-nose testing bilaterally Gait: not tested due to safety concerns    Laboratory Studies:   Basic Metabolic Panel:  Recent Labs Lab 10/16/16 0804 10/21/16 1400 10/22/16 0419  NA 140 139 140  K 3.7 3.3* 3.9  CL 106 105 108  CO2 30 27 28   GLUCOSE 96 79 90  BUN 7 7 10   CREATININE 1.04 1.26* 1.12  CALCIUM 8.9 9.0 8.6*  MG  --  2.1  --     Liver Function Tests:  Recent Labs Lab 10/16/16 0804  AST 20  ALT 17  ALKPHOS 49  BILITOT 0.5  PROT 7.0  ALBUMIN 4.4   No results for input(s): LIPASE, AMYLASE in the last  168 hours. No results for input(s): AMMONIA in the last 168 hours.  CBC:  Recent Labs Lab 10/16/16 0804 10/21/16 1400 10/22/16 0419  WBC 3.0* 3.3* 3.0*  NEUTROABS 1.4 1.9  --   HGB 13.7 13.0 13.2  HCT 39.5* 38.2* 38.0*  MCV 84.7 84.6 84.5  PLT 244 222 206    Cardiac Enzymes: No results for input(s): CKTOTAL, CKMB, CKMBINDEX, TROPONINI in the last 168 hours.  BNP: Invalid input(s): POCBNP  CBG: No results for input(s): GLUCAP in the last 168 hours.  Microbiology: No results found for this or any previous visit.  Coagulation Studies: No results for input(s): LABPROT, INR in the last 72 hours.  Urinalysis:  Recent Labs Lab 10/21/16 1803  COLORURINE YELLOW*  LABSPEC 1.018  PHURINE 6.0  GLUCOSEU NEGATIVE  HGBUR NEGATIVE  BILIRUBINUR NEGATIVE  KETONESUR 20*  PROTEINUR NEGATIVE  NITRITE NEGATIVE  LEUKOCYTESUR NEGATIVE    Lipid Panel:     Component Value Date/Time   CHOL 150 11/02/2015   TRIG 69 11/02/2015   HDL 54 11/02/2015   LDLCALC 82 11/02/2015    HgbA1C: No results  found for: HGBA1C  Urine Drug Screen:     Component Value Date/Time   LABOPIA NONE DETECTED 10/21/2016 1803   LABOPIA NONE DETECTED 05/12/2015 1752   COCAINSCRNUR NONE DETECTED 10/21/2016 1803   LABBENZ POSITIVE (A) 10/21/2016 1803   LABBENZ NONE DETECTED 05/12/2015 1752   AMPHETMU NONE DETECTED 10/21/2016 1803   AMPHETMU NONE DETECTED 05/12/2015 1752   THCU POSITIVE (A) 10/21/2016 1803   THCU POSITIVE (A) 05/12/2015 1752   LABBARB NONE DETECTED 10/21/2016 1803   LABBARB NONE DETECTED 05/12/2015 1752    Alcohol Level: No results for input(s): ETH in the last 168 hours.   Imaging: Dg Chest 1 View  Result Date: 10/21/2016 CLINICAL DATA:  Seizure. EXAM: CHEST 1 VIEW COMPARISON:  July 08, 2016 FINDINGS: The heart size and mediastinal contours are within normal limits. Both lungs are clear. The visualized skeletal structures are unremarkable. IMPRESSION: No active disease.  Electronically Signed   By: Gerome Samavid  Williams III M.D   On: 10/21/2016 14:26   Ct Head Wo Contrast  Result Date: 10/21/2016 CLINICAL DATA:  Seizure. EXAM: CT HEAD WITHOUT CONTRAST TECHNIQUE: Contiguous axial images were obtained from the base of the skull through the vertex without intravenous contrast. COMPARISON:  09/26/2016.  Brain MRI 10/21/2015 FINDINGS: Brain: No evidence of acute infarction, hemorrhage, hydrocephalus, extra-axial collection or mass lesion/mass effect. Asymmetric greater right temporal horn ventricular volume described on previous brain MRI is again noted -nonspecific. Vascular: Negative Skull: Negative Sinuses/Orbits: Negative IMPRESSION: No acute or interval finding. Electronically Signed   By: Marnee SpringJonathon  Watts M.D.   On: 10/21/2016 14:26     Assessment/Plan: 36 year old male with a history of seizures and noncompliance who presents with seizure activity.  Patient on Keppra, Lamictal and Depakote.  Complains of right leg numbness and weakness.  This is a common complaint after his seizures and has been worked up in the past and no pathology was noted.  May very well be part of a post-ictal phenomenon from his seizures.  Depakote level low at 44.  Compliance remains an issue.    Recommendations: 1.  Continue AED's as prescribed.  Would recheck Depakote level in AM 2.  Seizure precautions 3.  Ativan prn seizures 4.  PT  Thana FarrLeslie Meridian Scherger, MD Neurology [phone] 10/22/2016, 11:03 AM

## 2016-10-22 NOTE — Progress Notes (Signed)
Occupational Therapy Treatment Patient Details Name: Keith Chung. MRN: [id number] DOB: 08-14-1981 Today's Date: 10/22/2016    History of present illness Pt. is a 36 y.o. male who presents who was admitted to [facility] with multiple seizures, and right sided weakness and numbness. Pt. PMHx includes: Seizure Disorder, Learning Disorder, and Seasonal Allergies.    OT comments  Pt. is a 36 y.o. Male who was admitted to [facility] with multiple seizures, and right sided weakness, and numbness. Pt. presents with dominant right sided weakness, numbness, pain, and impaired functional mobility which hinder his ability to complete ADL and IADL functioning. Pt. could benefit from skilled OT services for ADL training, A/E training, UE there. Ex, and pt. education about home modification, and DME.     Follow Up Recommendations  Home health OT    Equipment Recommendations       Recommendations for Other Services      Precautions / Restrictions Precautions Precautions: Fall Precaution Comments: Seizures Restrictions Weight Bearing Restrictions: No              ADL Overall ADL's : Needs assistance/impaired Eating/Feeding: Set up;Minimal assistance (Assist for set-up and self-feeding secondary, and discomfort with IV.in right elbow region.)   Grooming: Set up;Minimal assistance               Lower Body Dressing: Moderate assistance                        Vision                     Perception     Praxis      Cognition   Behavior During Therapy:  (rarely makes eye contact) Overall Cognitive Status: Within Functional Limits for tasks assessed                       Extremity/Trunk Assessment  Upper Extremity Assessment Upper Extremity Assessment: Generalized weakness (Pt. presents with intact sensation, and  proprioceptive awareness in his right hand. RUE weakness, Grip strrngth: Right: 30#, Left: 90#)        Exercises     Shoulder Instructions        General Comments      Pertinent Vitals/ Pain       Pain Assessment: No/denies pain (Pt. reports soreness from the IV sight  when bending his elbow.)  Home Living Family/patient expects to be discharged to:: Private residence Living Arrangements: Parent Available Help at Discharge: Family (Pt. reports he is staying in a hotel until changes can be made to his home so that he can live downstairs on the ground level.) Type of Home: Other(Comment) (Duplex) Home Access: Level entry     Home Layout: One level               Home Equipment: None          Prior Functioning/Environment Level of Independence: Independent        Comments: Pt. reports he is unable towork, and he is on disability because of the seizures.   Frequency  Min 1X/week        Progress Toward Goals  OT Goals(current goals can now be found in the care plan section)     Acute Rehab OT Goals Patient Stated Goal: To return home, and regain independence OT Goal Formulation: With patient Potential to Achieve Goals: Good  Plan      Co-evaluation  End of Session     Activity Tolerance Patient tolerated treatment well   Patient Left in bed;with call bell/phone within reach;with bed alarm set   Nurse Communication      Functional Assessment Tool Used: Clinical judgment based on pt. current functional level Functional Limitation: Self care Self Care Current Status (Z6109(G8987): At least 20 percent but less than 40 percent impaired, limited or restricted Self Care Goal Status (U0454(G8988): At least 1 percent but less than 20 percent impaired, limited or restricted   Time: 1320-1345 OT Time Calculation (min): 25 min  Charges: OT G-codes **NOT FOR INPATIENT CLASS** Functional Assessment Tool Used: Clinical judgment based on pt. current functional level Functional Limitation: Self care Self Care Current Status (U9811(G8987): At least 20 percent but less than 40 percent impaired,  limited or restricted Self Care Goal Status (B1478(G8988): At least 1 percent but less than 20 percent impaired, limited or restricted OT General Charges $OT Visit: 1 Procedure OT Evaluation $OT Eval Moderate Complexity: 1 Procedure  Olegario MessierElaine Joneric Streight, MS, OTR/L 10/22/2016, 2:06 PM

## 2016-10-22 NOTE — Progress Notes (Signed)
[facility] [place] [facility]   PATIENT NAME: Keith Chung    MR#:  [id number]  DATE OF BIRTH:  [birth year]  SUBJECTIVE:  CHIEF COMPLAINT:   Chief Complaint  Patient presents with  . Seizures   Patient is a 36 year old Afro-American male with past medical history significant for history of seizure disorder, who is on lamotrigine, Depakote and Keppra at home, who presents to the hospital with complaints of right-sided weakness, numbness, intermittent, going on for more than a week. Electroencephalogram was performed as outpatient  30th of January , revealing right temporal region sharp transients consistent with history of seizures.   Review of Systems  Neurological: Positive for sensory change and focal weakness.    VITAL SIGNS: Blood pressure 129/77, pulse 66, temperature 98 F (36.7 C), temperature source Oral, resp. rate 16, height 6\' 2"  (1.88 m), weight 74.8 kg (165 lb), SpO2 99 %.  PHYSICAL EXAMINATION:   GENERAL:  36 y.o.-year-old patient lying in the bed with no acute distress.  EYES: Pupils equal, round, reactive to light and accommodation. No scleral icterus. Extraocular muscles intact.  HEENT: Head atraumatic, normocephalic. Oropharynx and nasopharynx clear.  NECK:  Supple, no jugular venous distention. No thyroid enlargement, no tenderness.  LUNGS: Normal breath sounds bilaterally, no wheezing, rales,rhonchi or crepitation. No use of accessory muscles of respiration.  CARDIOVASCULAR: S1, S2 normal. No murmurs, rubs, or gallops.  ABDOMEN: Soft, nontender, nondistended. Bowel sounds present. No organomegaly or mass.  EXTREMITIES: No pedal edema, cyanosis, or clubbing.  NEUROLOGIC: Cranial nerves II through XII are intact. Muscle strength 3/5 in right-sided extremities, worse in the leg, 5 out of 5 in left-sided. Sensation decreased to light touch on the right. Gait not checked.  PSYCHIATRIC: The patient is alert and oriented x 3.  SKIN: No  obvious rash, lesion, or ulcer.   ORDERS/RESULTS REVIEWED:   CBC  Recent Labs Lab 10/16/16 0804 10/21/16 1400 10/22/16 0419  WBC 3.0* 3.3* 3.0*  HGB 13.7 13.0 13.2  HCT 39.5* 38.2* 38.0*  PLT 244 222 206  MCV 84.7 84.6 84.5  MCH 29.3 28.8 29.4  MCHC 34.6 34.0 34.8  RDW 12.0 12.0 12.0  LYMPHSABS 1.1 1.0  --   MONOABS 0.3 0.3  --   EOSABS 0.2 0.1  --   BASOSABS 0.0 0.0  --    ------------------------------------------------------------------------------------------------------------------  Chemistries   Recent Labs Lab 10/16/16 0804 10/21/16 1400 10/22/16 0419  NA 140 139 140  K 3.7 3.3* 3.9  CL 106 105 108  CO2 30 27 28   GLUCOSE 96 79 90  BUN 7 7 10   CREATININE 1.04 1.26* 1.12  CALCIUM 8.9 9.0 8.6*  MG  --  2.1  --   AST 20  --   --   ALT 17  --   --   ALKPHOS 49  --   --   BILITOT 0.5  --   --    ------------------------------------------------------------------------------------------------------------------ estimated creatinine clearance is 98.3 mL/min (by C-G formula based on SCr of 1.12 mg/dL). ------------------------------------------------------------------------------------------------------------------ No results for input(s): TSH, T4TOTAL, T3FREE, THYROIDAB in the last 72 hours.  Invalid input(s): FREET3  Cardiac Enzymes No results for input(s): CKMB, TROPONINI, MYOGLOBIN in the last 168 hours.  Invalid input(s): CK ------------------------------------------------------------------------------------------------------------------ Invalid input(s): POCBNP ---------------------------------------------------------------------------------------------------------------  RADIOLOGY: Dg Chest 1 View  Result Date: 10/21/2016 CLINICAL DATA:  Seizure. EXAM: CHEST 1 VIEW COMPARISON:  July 08, 2016 FINDINGS: The heart size and mediastinal contours are within normal limits. Both lungs are  clear. The visualized skeletal structures are unremarkable.  IMPRESSION: No active disease. Electronically Signed   By: Gerome Sam III M.D   On: 10/21/2016 14:26   Ct Head Wo Contrast  Result Date: 10/21/2016 CLINICAL DATA:  Seizure. EXAM: CT HEAD WITHOUT CONTRAST TECHNIQUE: Contiguous axial images were obtained from the base of the skull through the vertex without intravenous contrast. COMPARISON:  09/26/2016.  Brain MRI 10/21/2015 FINDINGS: Brain: No evidence of acute infarction, hemorrhage, hydrocephalus, extra-axial collection or mass lesion/mass effect. Asymmetric greater right temporal horn ventricular volume described on previous brain MRI is again noted -nonspecific. Vascular: Negative Skull: Negative Sinuses/Orbits: Negative IMPRESSION: No acute or interval finding. Electronically Signed   By: Marnee Spring M.D.   On: 10/21/2016 14:26    EKG:  Orders placed or performed during the hospital encounter of 10/16/16  . ED EKG  . ED EKG  . EKG 12-Lead  . EKG 12-Lead  . EKG 12-Lead  . EKG 12-Lead    ASSESSMENT AND PLAN:  Active Problems:   Seizure (HCC) #1. Right-sided weakness, numbness, etiology remains unclear, likely related to ongoing nonconvulsive seizure, electroencephalogram January 30 revealed right temporal region, sharp transients, consistent with known seizure disorder, continue patient on lamotrigine, Depakote, Keppra, check levels tomorrow, discussed with pharmacy. Appreciate neurology's input. Physical therapist evaluated patient and recommended supervision for mobility, out of bed, outpatient physical therapy via [facility].  #2. Hypokalemia, resolved #3. Leukopenia, chronic, etiology is unclear, may be related to Depakote #4, cannabinoids abuse, patient may benefit from outpatient counseling, substance abuse program   Management plans discussed with the patient, family and they are in agreement.   DRUG ALLERGIES:  Allergies  Allergen Reactions  . Pollen Extract Other (See Comments)    Runny nose, watery eyes,  sneezing    CODE STATUS:     Code Status Orders        Start     Ordered   10/21/16 1632  Full code  Continuous     10/21/16 1631    Code Status History    Date Active Date Inactive Code Status Order ID Comments User Context   09/26/2016  9:15 PM 09/30/2016 11:05 AM Full Code [id number]  Katharina Caper, MD Inpatient   05/09/2016  5:55 PM 05/13/2016  5:13 PM Full Code [id number]  Enedina Finner, MD Inpatient   04/18/2016  5:12 PM 04/19/2016  6:38 PM Full Code [id number]  Wyatt Haste, MD ED   10/16/2015  6:09 PM 10/18/2015  5:36 PM Full Code [id number]  Enid Baas, MD Inpatient      TOTAL TIME TAKING CARE OF THIS PATIENT: 40  minutes.    Katharina Caper M.D on 10/22/2016 at 5:01 PM  Between 7am to 6pm - Pager - [phone]  After 6pm go to [email or web address] - password [email or web address]  [facility]  Office  [phone]  CC: Primary care physician; Pcp Not In System

## 2016-10-22 NOTE — Evaluation (Signed)
Physical Therapy Evaluation Patient Details Name: Keith Chung. MRN: [id number] DOB: 1981/04/20 Today's Date: 10/22/2016   History of Present Illness  Pt is a 36 y.o. male presenting to hospital after multiple seizures.  Pt admitted with R sided weakness and numbness. PMH includes learning disorder, seizures, and R hand surgery.   Clinical Impression  Prior to hospital admission, pt was independent with functional mobility.  Pt lives with his mom is 1 level home.  Currently pt is SBA with bed mobility, min assist with transfers, and CGA x2 (for safety) with ambulation 75 feet with RW.  Pt initially demonstrating trace contraction in R LE and pt reporting no sensation and no proprioception with entire R LE.  With increased use of R LE, improved strength/movement noted with RW.  No R LE buckling noted during session and appeared to accept weight well through R LE.  Pt would benefit from skilled PT to address noted impairments and functional limitations.  Recommend pt discharge to home with supervision for mobility and OP PT (via [facility]) when medically appropriate.    Follow Up Recommendations Supervision for mobility/OOB (OP PT via [facility])    Equipment Recommendations  Rolling walker with 5" wheels    Recommendations for Other Services       Precautions / Restrictions Precautions Precautions: Fall Precaution Comments: Seizures Restrictions Weight Bearing Restrictions: No      Mobility  Bed Mobility Overal bed mobility: Needs Assistance Bed Mobility: Supine to Sit;Sit to Supine     Supine to sit: Supervision;HOB elevated Sit to supine: Supervision;HOB elevated   General bed mobility comments: pt using B UE's to move R LE supine to sit but able to move R LE into bed without UE support; increased time to perform  Transfers Overall transfer level: Needs assistance Equipment used: Rolling walker (2 wheeled) Transfers: Sit to/from Stand Sit to Stand: Min assist          General transfer comment: pt stood 1st trial holding onto bed rail with L UE; 2nd trial pt used RW; pt leaning onto L LE to stand but able to shift weight to R side with cueing  Ambulation/Gait Ambulation/Gait assistance: Min guard;+2 safety/equipment Ambulation Distance (Feet): 75 Feet Assistive device: Rolling walker (2 wheeled)   Gait velocity: decreased   General Gait Details: performed weight shifts side to side initially and improved weight acceptance noted to R LE; pt with initial difficulty advancing R LE but improved quality/decreased difficulty noted with distance; pt with fairly good stance time via R LE throughout ambulation with advancement of L LE; no significant UE support noted through RW during ambulation; vc's required to increase B step length, for R LE advancement, and use of RW  Stairs            Wheelchair Mobility    Modified Rankin (Stroke Patients Only)       Balance Overall balance assessment: Needs assistance Sitting-balance support: Bilateral upper extremity supported;Feet supported Sitting balance-Leahy Scale: Good     Standing balance support: Single extremity supported Standing balance-Leahy Scale: Fair Standing balance comment: static standing with L UE support on bed rail                             Pertinent Vitals/Pain Pain Assessment: No/denies pain  Vitals (HR and O2 on room air) stable and WFL throughout treatment session.    Home Living Family/patient expects to be discharged to:: Private  residence Living Arrangements: Parent (Pt's mother) Available Help at Discharge: Family Type of Home: Other(Comment) (Duplex) Home Access: Level entry     Home Layout: One level Home Equipment: None      Prior Function Level of Independence: Independent         Comments: Pt reports he does not drive and has filed for disability.     Hand Dominance        Extremity/Trunk Assessment   Upper Extremity  Assessment Upper Extremity Assessment: Defer to OT evaluation    Lower Extremity Assessment Lower Extremity Assessment: RLE deficits/detail (L LE strength WFL) RLE Deficits / Details: Trace contraction noted with R hip flexion, knee flexion/extension, and DF/PF.  Good quad set on R noted with L SLR but not with vc's/tactile cues.  Pt reporting no sensation throughout entire R LE with testing.  Pt also without proprioception R LE with testing.       Communication   Communication: No difficulties  Cognition Arousal/Alertness: Awake/alert Behavior During Therapy:  (rarely makes eye contact) Overall Cognitive Status: Within Functional Limits for tasks assessed                      General Comments   Nursing cleared pt for participation in physical therapy.  Pt agreeable to PT session.    Exercises  Gait training (see above).   Assessment/Plan    PT Assessment Patient needs continued PT services  PT Problem List Decreased strength;Decreased balance;Decreased mobility;Decreased knowledge of use of DME;Impaired sensation          PT Treatment Interventions DME instruction;Gait training;Functional mobility training;Therapeutic activities;Therapeutic exercise;Balance training;Patient/family education;Neuromuscular re-education    PT Goals (Current goals can be found in the Care Plan section)  Acute Rehab PT Goals Patient Stated Goal: to improve R sided strength and sensation PT Goal Formulation: With patient Time For Goal Achievement: 11/05/16 Potential to Achieve Goals: Fair    Frequency Min 2X/week   Barriers to discharge        Co-evaluation               End of Session Equipment Utilized During Treatment: Gait belt Activity Tolerance: Patient tolerated treatment well Patient left: in bed;with call bell/phone within reach;with bed alarm set;with family/visitor present Nurse Communication: Mobility status;Precautions         Time: [id number] PT Time  Calculation (min) (ACUTE ONLY): 39 min   Charges:   PT Evaluation $PT Eval Low Complexity: 1 Procedure PT Treatments $Gait Training: 8-22 mins   PT G CodesHendricks Limes:        Jalexia Lalli 10/22/2016, 1:48 PM Hendricks LimesEmily Haile Bosler, PT [phone]

## 2016-10-23 DIAGNOSIS — Z5181 Encounter for therapeutic drug level monitoring: Secondary | ICD-10-CM

## 2016-10-23 DIAGNOSIS — F1210 Cannabis abuse, uncomplicated: Secondary | ICD-10-CM

## 2016-10-23 DIAGNOSIS — R569 Unspecified convulsions: Secondary | ICD-10-CM | POA: Diagnosis not present

## 2016-10-23 DIAGNOSIS — R531 Weakness: Secondary | ICD-10-CM

## 2016-10-23 DIAGNOSIS — R7889 Finding of other specified substances, not normally found in blood: Secondary | ICD-10-CM

## 2016-10-23 DIAGNOSIS — E876 Hypokalemia: Secondary | ICD-10-CM

## 2016-10-23 LAB — LAMOTRIGINE LEVEL: LAMOTRIGINE LVL: 5 ug/mL (ref 2.0–20.0)

## 2016-10-23 LAB — VALPROIC ACID LEVEL: Valproic Acid Lvl: 81 ug/mL (ref 50.0–100.0)

## 2016-10-23 MED ORDER — DIVALPROEX SODIUM 500 MG PO DR TAB: 500.0000 mg | DELAYED_RELEASE_TABLET | Freq: Two times a day (BID) | ORAL | 6 refills | Status: AC

## 2016-10-23 MED ORDER — LEVETIRACETAM 750 MG PO TABS
750.0000 mg | ORAL_TABLET | Freq: Two times a day (BID) | ORAL | 6 refills | Status: AC
Start: 2016-10-23 — End: ?

## 2016-10-23 NOTE — Plan of Care (Signed)
Problem: Safety: Goal: Ability to remain free from injury will improve Outcome: Progressing Patient remained safe and comfortable. No falls, injury or seizure activity noted thus far. Needs attended.

## 2016-10-23 NOTE — Progress Notes (Signed)
Discussed discharge instructions and medications with pt and his mother. IV removed. All questions addressed. Went over pt's home medications extensively with pt and his mother, they both verbalized understanding.  Pt transported home via car by his mother.  Orson Apeanielle Alexanderia Gorby, RN

## 2016-10-23 NOTE — Progress Notes (Signed)
Subjective: No further seizures noted.  Patient reports that he is back to baseline.  Admits to noncompliance.    Objective: Current vital signs: BP 132/72 (BP Location: Left Arm)   Pulse (!) 56   Temp 98.1 F (36.7 C) (Oral)   Resp 20   Ht 6\' 2"  (1.88 m)   Wt 74.8 kg (165 lb)   SpO2 98%   BMI 21.18 kg/m  Vital signs in last 24 hours: Temp:  [97.7 F (36.5 C)-98.1 F (36.7 C)] 98.1 F (36.7 C) (02/06 0419) Pulse Rate:  [56-66] 56 (02/06 0419) Resp:  [16-20] 20 (02/06 0419) BP: (129-137)/(72-83) 132/72 (02/06 0419) SpO2:  [98 %-99 %] 98 % (02/06 0419)  Intake/Output from previous day: 02/05 0701 - 02/06 0700 In: 960 [P.O.:960] Out: -  Intake/Output this shift: Total I/O In: 240 [P.O.:240] Out: -  Nutritional status: Diet regular Room service appropriate? Yes; Fluid consistency: Thin Diet - low sodium heart healthy  Neurologic Exam: Mental Status: Alert, oriented, thought content appropriate.  Speech fluent without evidence of aphasia.  Able to follow 3 step commands without difficulty. Cranial Nerves: II: Discs flat bilaterally; Visual fields grossly normal, pupils equal, round, reactive to light and accommodation III,IV, VI: ptosis not present, extra-ocular motions intact bilaterally V,VII: smile symmetric, facial light touch sensation normal bilaterally VIII: hearing normal bilaterally IX,X: gag reflex present XI: bilateral shoulder shrug XII: midline tongue extension Motor: Right :  Upper extremity   5/5                                      Left:     Upper extremity   5/5             Lower extremity   5/5                                                  Lower extremity   5/5   Lab Results: Basic Metabolic Panel:  Recent Labs Lab 10/21/16 1400 10/22/16 0419  NA 139 140  K 3.3* 3.9  CL 105 108  CO2 27 28  GLUCOSE 79 90  BUN 7 10  CREATININE 1.26* 1.12  CALCIUM 9.0 8.6*  MG 2.1  --     Liver Function Tests: No results for input(s): AST, ALT,  ALKPHOS, BILITOT, PROT, ALBUMIN in the last 168 hours. No results for input(s): LIPASE, AMYLASE in the last 168 hours. No results for input(s): AMMONIA in the last 168 hours.  CBC:  Recent Labs Lab 10/21/16 1400 10/22/16 0419  WBC 3.3* 3.0*  NEUTROABS 1.9  --   HGB 13.0 13.2  HCT 38.2* 38.0*  MCV 84.6 84.5  PLT 222 206    Cardiac Enzymes: No results for input(s): CKTOTAL, CKMB, CKMBINDEX, TROPONINI in the last 168 hours.  Lipid Panel: No results for input(s): CHOL, TRIG, HDL, CHOLHDL, VLDL, LDLCALC in the last 168 hours.  CBG: No results for input(s): GLUCAP in the last 168 hours.  Microbiology: No results found for this or any previous visit.  Coagulation Studies: No results for input(s): LABPROT, INR in the last 72 hours.  Imaging: Dg Chest 1 View  Result Date: 10/21/2016 CLINICAL DATA:  Seizure. EXAM: CHEST 1 VIEW COMPARISON:  July 08, 2016 FINDINGS: The heart  size and mediastinal contours are within normal limits. Both lungs are clear. The visualized skeletal structures are unremarkable. IMPRESSION: No active disease. Electronically Signed   By: Gerome Sam III M.D   On: 10/21/2016 14:26   Ct Head Wo Contrast  Result Date: 10/21/2016 CLINICAL DATA:  Seizure. EXAM: CT HEAD WITHOUT CONTRAST TECHNIQUE: Contiguous axial images were obtained from the base of the skull through the vertex without intravenous contrast. COMPARISON:  09/26/2016.  Brain MRI 10/21/2015 FINDINGS: Brain: No evidence of acute infarction, hemorrhage, hydrocephalus, extra-axial collection or mass lesion/mass effect. Asymmetric greater right temporal horn ventricular volume described on previous brain MRI is again noted -nonspecific. Vascular: Negative Skull: Negative Sinuses/Orbits: Negative IMPRESSION: No acute or interval finding. Electronically Signed   By: Marnee Spring M.D.   On: 10/21/2016 14:26    Medications:  I have reviewed the patient's current medications. Scheduled: . divalproex   500 mg Oral BID AC & HS  . enoxaparin (LOVENOX) injection  40 mg Subcutaneous Q24H  . lamoTRIgine  125 mg Oral Daily  . levETIRAcetam  1,000 mg Oral BID    Assessment/Plan: Patient reports that he is back to baseline. No further seizures noted.  Prescribed medications have been restarted.  Depakote level of 81.  Compliance stressed.  Recommendations: 1.  Patient to continue AED's as prescribed by outpatient neurologist.   2.  No further neurologic intervention is recommended at this time.  If further questions arise, please call or page at that time.  Thank you for allowing neurology to participate in the care of this patient.  Patient to follow up with Dr. Malvin Johns.    Thana Farr, MD Neurology [phone] 10/23/2016  12:33 PM   LOS: 0 days   Thana Farr, MD Neurology [phone] 10/23/2016  12:30 PM

## 2016-10-23 NOTE — Progress Notes (Signed)
Physical Therapy Treatment Patient Details Name: Keith Chung. MRN: [id number] DOB: May 15, 1981 Today's Date: 10/23/2016    History of Present Illness Pt is a 36 y.o. male presenting to hospital after multiple seizures.  Pt admitted with R sided weakness and numbness. PMH includes learning disorder, seizures, and R hand surgery.    PT Comments    Pt reports his R LE sensation returned around 5 pm last night and now he does not need to use the walker anymore.  Pt ambulated around nursing loop (no AD) independently and performed dynamic balance activities without any loss of balance.  R LE sensation, strength, and proprioception now testing WNL.  Pt appears safe to discharge home when medically appropriate.  No further PT or DME needs indicated upon hospital discharge (CM and MD notified).   Follow Up Recommendations  No PT follow up     Equipment Recommendations  None recommended by PT    Recommendations for Other Services       Precautions / Restrictions Precautions Precautions: Fall Precaution Comments: Seizures Restrictions Weight Bearing Restrictions: No    Mobility  Bed Mobility Overal bed mobility: Modified Independent Bed Mobility: Supine to Sit     Supine to sit: Modified independent (Device/Increase time);HOB elevated     General bed mobility comments: No difficulty noted  Transfers Overall transfer level: Independent Equipment used: None Transfers: Sit to/from Stand Sit to Stand: Independent         General transfer comment: steady strong stand/transfer  Ambulation/Gait Ambulation/Gait assistance: Independent Ambulation Distance (Feet): 220 Feet Assistive device: None Gait Pattern/deviations: WFL(Within Functional Limits)   Gait velocity interpretation: at or above normal speed for age/gender General Gait Details: steady without loss of balance   Stairs Stairs:  (Pt reports he does not need to perform any stairs at home (moving to main  level of hotel he is staying in today))          Wheelchair Mobility    Modified Rankin (Stroke Patients Only)       Balance Overall balance assessment: Independent Sitting-balance support: No upper extremity supported;Feet supported Sitting balance-Leahy Scale: Normal     Standing balance support: No upper extremity supported;During functional activity Standing balance-Leahy Scale: Normal Standing balance comment: dynamic reaching outside of BOS               High Level Balance Comments: No loss of balance with ambulation and head turns R/L/up/down, increasing/decreasing speed, and turning and stopping.    Cognition Arousal/Alertness: Awake/alert Behavior During Therapy: WFL for tasks assessed/performed (Good eye contact; engaging in conversation) Overall Cognitive Status: Within Functional Limits for tasks assessed                      Exercises      General Comments General comments (skin integrity, edema, etc.): Pt sitting up in bed with pastor present upon PT entering room.  Pt agreeable to PT session.      Pertinent Vitals/Pain Pain Assessment: No/denies pain  Vitals (HR and O2 on room air) stable and WFL throughout treatment session.    Home Living                      Prior Function            PT Goals (current goals can now be found in the care plan section) Acute Rehab PT Goals Patient Stated Goal: To return home PT Goal Formulation: With patient Time For  Goal Achievement: 11/06/16 Potential to Achieve Goals: Good Progress towards PT goals: Goals met and updated - see care plan    Frequency    Min 2X/week      PT Plan Discharge plan needs to be updated    Co-evaluation             End of Session Equipment Utilized During Treatment: Gait belt Activity Tolerance: Patient tolerated treatment well Patient left: with call bell/phone within reach;with bed alarm set (sitting on edge of bed with MD present and bed  alarm set)     Time: 0850-0900 PT Time Calculation (min) (ACUTE ONLY): 10 min  Charges:  $Therapeutic Activity: 8-22 mins                    G CodesLeitha Bleak 10-29-2016, 9:10 AM Leitha Bleak, PT [phone]

## 2016-10-23 NOTE — Discharge Summary (Signed)
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 Learning disorder   . Seasonal allergies   . Seizures (HCC)     .pro HOSPITAL COURSE:   The patient is a 36 year old African-American male with history of seizure disorder, medical noncompliance, who does not take his medications, who presents to the hospital with seizure episode, right-sided numbness and weakness. He was brought to emergency room for further evaluation and treatment and was admitted. Initial labs revealed valproic acid level of 44. Patient was loaded with Keppra and Depakote intravenously. He was  initiated on his usual seizure medications. His labs revealed leukopenia, urine drug screen was positive for benzodiazepines and cannabinoids, urinalysis was unremarkable.  With conservative therapy his condition improved, right-sided weakness and numbness disappeared. He was seen by physical therapist and no physical therapist follow-up was recommended as outpatient. He was also evaluated by neurologist again and recommended to be compliant with his medications, follow-up with primary neurologist as outpatient. He was felt to be stable to be discharged home today Discussion by problem: #1. Seizure with right-sided weakness, numbness,  electroencephalogram January 30 revealed right temporal region sharp transients, consistent with known seizure disorder, continue patient on  lamotrigine, Depakote, Keppra, repeated valproic acid level was 81 . Appreciate neurology's input. Physical therapist evaluated patient and recommended no physical therapy. Follow-up. Patient was recommended to follow-up with primary neurologist, Dr. Malvin Johns, be compliant with his medications.   #2. Hypokalemia, resolved #3. Leukopenia, chronic, etiology is unclear, may be related to Depakote #4, cannabinoids abuse, patient may benefit from outpatient counseling, substance abuse program DISCHARGE CONDITIONS:   Stable  CONSULTS OBTAINED:  Treatment Team:  Pauletta Browns, MD  DRUG ALLERGIES:   Allergies  Allergen Reactions  . Pollen Extract Other (See Comments)    Runny nose, watery eyes, sneezing    DISCHARGE MEDICATIONS:   Discharge Medication List as of 10/23/2016 12:12 PM    CONTINUE these medications which have CHANGED   Details  divalproex (DEPAKOTE) 500 MG DR tablet Take 1 tablet (500 mg total) by mouth 2 (two) times daily., Starting Tue 10/23/2016, Normal    levETIRAcetam (KEPPRA) 750 MG tablet Take 1 tablet (750 mg total) by mouth 2 (two) times daily., Starting Tue 10/23/2016, Normal      CONTINUE these medications which have NOT CHANGED   Details  ibuprofen (ADVIL,MOTRIN) 600 MG tablet Take 600 mg by mouth every 6 (six) hours as needed., Historical Med    !! lamoTRIgine (LAMICTAL) 100 MG tablet Take 100 mg by mouth 2 (two) times daily. , Historical Med    !! lamoTRIgine (LAMICTAL) 25 MG tablet Take 25 mg by mouth 2 (two) times daily. , Historical Med     !! - Potential duplicate medications found. Please discuss with provider.       DISCHARGE INSTRUCTIONS:    The patient is to follow-up with neurologist, Dr. Hale Bogus as outpatient  If you  experience worsening of your admission symptoms, develop shortness of breath, life threatening emergency, suicidal or homicidal thoughts you must seek medical attention immediately by calling 911 or calling your MD immediately  if  symptoms less severe.  You Must read complete instructions/literature along with all the possible adverse reactions/side effects for all the Medicines you take and that have been prescribed to you. Take any new Medicines after you have completely understood and accept all the possible adverse reactions/side effects.   Please note  You were cared for by a hospitalist during your hospital stay. If you have any questions about your discharge medications or the care you received while you were in the hospital after you are discharged, you can call the unit and asked to speak with the hospitalist on call if the hospitalist that took care of you is not available. Once you are discharged, your primary care physician will handle any further medical issues. Please note that NO REFILLS for any discharge medications will be authorized once you are discharged, as it is imperative that you return to your primary care physician (or establish a relationship with a primary care physician if you do not have one) for your aftercare needs so that they can reassess your need for medications and monitor your lab values.    Today   CHIEF COMPLAINT:   Chief Complaint  Patient presents with  . Seizures    HISTORY OF PRESENT ILLNESS:  Keith Chung  is a 36 y.o. male with a known history ofseizure disorder, medical noncompliance, who does not take his medications, who presents to the hospital with seizure episode, right-sided numbness and weakness. He was brought to emergency room for further evaluation and treatment and was admitted. Initial labs revealed valproic acid level of 44. Patient was loaded with Keppra and Depakote intravenously. He was  initiated on his usual seizure medications. His labs revealed leukopenia, urine drug screen was positive for benzodiazepines and cannabinoids, urinalysis was unremarkable.  With conservative therapy his condition improved, right-sided weakness and numbness disappeared. He  was seen by physical therapist and no physical therapist follow-up was recommended as outpatient. He was also evaluated by neurologist again and recommended to be compliant with his medications, follow-up with primary neurologist as outpatient. He was felt to be stable to be discharged home today Discussion by problem: #1. Seizure with right-sided weakness, numbness,  electroencephalogram January 30 revealed right temporal region sharp transients, consistent with known seizure disorder, continue patient on lamotrigine, Depakote, Keppra, repeated valproic acid level was 81 . Appreciate neurology's input. Physical therapist evaluated patient and recommended no physical therapy. Follow-up. Patient was recommended to follow-up with primary neurologist, Dr. Malvin JohnsPotter, be compliant with his medications.   #2. Hypokalemia, resolved #3. Leukopenia, chronic, etiology is unclear, may be related to Depakote #4, cannabinoids abuse, patient may benefit from outpatient counseling, substance abuse program     VITAL SIGNS:  Blood pressure 132/72, pulse (!) 56, temperature 98.1 F (36.7 C), temperature source Oral, resp. rate 20, height 6\' 2"  (1.88 m), weight 74.8 kg (165 lb), SpO2 98 %.  I/O:   Intake/Output Summary (Last 24 hours) at 10/23/16 1625 Last data filed at 10/23/16 0900  Gross per 24 hour  Intake              960 ml  Output                0 ml  Net  960 ml    PHYSICAL EXAMINATION:  GENERAL:  36 y.o.-year-old patient lying in the bed with no acute distress.  EYES: Pupils equal, round, reactive to light and accommodation. No scleral icterus. Extraocular muscles intact.  HEENT: Head atraumatic, normocephalic. Oropharynx and nasopharynx clear.  NECK:  Supple, no jugular venous distention. No thyroid enlargement, no tenderness.  LUNGS: Normal breath sounds bilaterally, no wheezing, rales,rhonchi or crepitation. No use of accessory muscles of respiration.  CARDIOVASCULAR: S1, S2 normal. No  murmurs, rubs, or gallops.  ABDOMEN: Soft, non-tender, non-distended. Bowel sounds present. No organomegaly or mass.  EXTREMITIES: No pedal edema, cyanosis, or clubbing.  NEUROLOGIC: Cranial nerves II through XII are intact. Muscle strength 5/5 in all extremities. Sensation intact. Gait not checked.  PSYCHIATRIC: The patient is alert and oriented x 3.  SKIN: No obvious rash, lesion, or ulcer.   DATA REVIEW:   CBC  Recent Labs Lab 10/22/16 0419  WBC 3.0*  HGB 13.2  HCT 38.0*  PLT 206    Chemistries   Recent Labs Lab 10/21/16 1400 10/22/16 0419  NA 139 140  K 3.3* 3.9  CL 105 108  CO2 27 28  GLUCOSE 79 90  BUN 7 10  CREATININE 1.26* 1.12  CALCIUM 9.0 8.6*  MG 2.1  --     Cardiac Enzymes No results for input(s): TROPONINI in the last 168 hours.  Microbiology Results  No results found for this or any previous visit.  RADIOLOGY:  No results found.  EKG:   Orders placed or performed during the hospital encounter of 10/16/16  . ED EKG  . ED EKG  . EKG 12-Lead  . EKG 12-Lead  . EKG 12-Lead  . EKG 12-Lead      Management plans discussed with the patient, family and they are in agreement.  CODE STATUS:     Code Status Orders        Start     Ordered   10/21/16 1632  Full code  Continuous     10/21/16 1631    Code Status History    Date Active Date Inactive Code Status Order ID Comments User Context   09/26/2016  9:15 PM 09/30/2016 11:05 AM Full Code [id number]  Katharina Caper, MD Inpatient   05/09/2016  5:55 PM 05/13/2016  5:13 PM Full Code [id number]  Enedina Finner, MD Inpatient   04/18/2016  5:12 PM 04/19/2016  6:38 PM Full Code [id number]  Wyatt Haste, MD ED   10/16/2015  6:09 PM 10/18/2015  5:36 PM Full Code [id number]  Enid Baas, MD Inpatient      TOTAL TIME TAKING CARE OF THIS PATIENT: 40 minutes.   Discussed with Dr. Edwena Bunde M.D on 10/23/2016 at 4:25 PM  Between 7am to 6pm - Pager - [phone]  After 6pm go to [email or web address] -  password [email or web address]  [facility]  Office  [phone]  CC: Primary care physician; Pcp Not In System

## 2016-10-24 LAB — LAMOTRIGINE LEVEL: LAMOTRIGINE LVL: 5.5 ug/mL (ref 2.0–20.0)

## 2016-10-24 LAB — LEVETIRACETAM LEVEL: LEVETIRACETAM: 3.6 ug/mL — AB (ref 10.0–40.0)

## 2016-10-25 LAB — LEVETIRACETAM LEVEL: Levetiracetam Lvl: 18.6 ug/mL (ref 10.0–40.0)

## 2016-11-30 ENCOUNTER — Encounter: Payer: Self-pay | Admitting: *Deleted

## 2016-11-30 ENCOUNTER — Emergency Department
Admission: EM | Admit: 2016-11-30 | Discharge: 2016-11-30 | Disposition: A | Discharge status: Home / Self Care | Payer: Medicaid Other | Attending: Emergency Medicine | Admitting: Emergency Medicine

## 2016-11-30 DIAGNOSIS — Z79899 Other long term (current) drug therapy: Secondary | ICD-10-CM | POA: Diagnosis not present

## 2016-11-30 DIAGNOSIS — R569 Unspecified convulsions: Secondary | ICD-10-CM

## 2016-11-30 DIAGNOSIS — G40909 Epilepsy, unspecified, not intractable, without status epilepticus: Secondary | ICD-10-CM | POA: Insufficient documentation

## 2016-11-30 DIAGNOSIS — Z87891 Personal history of nicotine dependence: Secondary | ICD-10-CM | POA: Diagnosis not present

## 2016-11-30 DIAGNOSIS — R55 Syncope and collapse: Secondary | ICD-10-CM

## 2016-11-30 LAB — URINALYSIS, COMPLETE (UACMP) WITH MICROSCOPIC
BACTERIA UA: NONE SEEN
BILIRUBIN URINE: NEGATIVE
Glucose, UA: NEGATIVE mg/dL
Hgb urine dipstick: NEGATIVE
KETONES UR: NEGATIVE mg/dL
Leukocytes, UA: NEGATIVE
Nitrite: NEGATIVE
PROTEIN: 30 mg/dL — AB
SQUAMOUS EPITHELIAL / LPF: NONE SEEN
Specific Gravity, Urine: 1.019 (ref 1.005–1.030)
pH: 6 (ref 5.0–8.0)

## 2016-11-30 LAB — BASIC METABOLIC PANEL
Anion gap: 9 (ref 5–15)
BUN: 14 mg/dL (ref 6–20)
CHLORIDE: 103 mmol/L (ref 101–111)
CO2: 24 mmol/L (ref 22–32)
Calcium: 9.2 mg/dL (ref 8.9–10.3)
Creatinine, Ser: 1.22 mg/dL (ref 0.61–1.24)
GFR calc non Af Amer: >60 mL/min (ref >60)
GLUCOSE: 130 mg/dL — AB (ref 65–99)
Potassium: 4.7 mmol/L (ref 3.5–5.1)
Sodium: 136 mmol/L (ref 135–145)

## 2016-11-30 LAB — TROPONIN I: Troponin I: <0.03 ng/mL

## 2016-11-30 LAB — URINE DRUG SCREEN, QUALITATIVE (ARMC ONLY)
Amphetamines, Ur Screen: NOT DETECTED
Barbiturates, Ur Screen: NOT DETECTED
Benzodiazepine, Ur Scrn: NOT DETECTED
Cannabinoid 50 Ng, Ur ~~LOC~~: POSITIVE — AB
Cocaine Metabolite,Ur ~~LOC~~: NOT DETECTED
MDMA (Ecstasy)Ur Screen: NOT DETECTED
Methadone Scn, Ur: NOT DETECTED
Opiate, Ur Screen: NOT DETECTED
Phencyclidine (PCP) Ur S: NOT DETECTED
Tricyclic, Ur Screen: NOT DETECTED

## 2016-11-30 LAB — VALPROIC ACID LEVEL

## 2016-11-30 LAB — CBC
HCT: 42.7 % (ref 40.0–52.0)
Hemoglobin: 14.6 g/dL (ref 13.0–18.0)
MCH: 29.1 pg (ref 26.0–34.0)
MCHC: 34.3 g/dL (ref 32.0–36.0)
MCV: 85 fL (ref 80.0–100.0)
Platelets: 253 K/uL (ref 150–440)
RBC: 5.03 MIL/uL (ref 4.40–5.90)
RDW: 12.9 % (ref 11.5–14.5)
WBC: 4 K/uL (ref 3.8–10.6)

## 2016-11-30 NOTE — ED Notes (Signed)
Pt resting in bed, resp even and unlabored, family at bedside 

## 2016-11-30 NOTE — ED Provider Notes (Signed)
[facility] Emergency Department Provider Note ____________________________________________   I have reviewed the triage vital signs and the triage nursing note.  HISTORY  Chief Complaint Seizures Seizure  Historian Patient  HPI Keith Dubs. is a 36 y.o. male was brought in by EMS after report of seizure. EMS states that the girlfriend called because the patient stood up next to the bed and then collapsed onto the bed and was reported to have had a seizure. They reported that there was no incontinence.  Patient states that he occasionally feels lightheaded and dizzy. Today he stood up and felt lightheaded and dizzy and then fell onto the bed. No traumatic injury. He states that he thinks he could've had a little seizure, but he remembers the whole thing and was able to hear but not respond. He denies prior history of syncope, although he says that sometimes he has a big seizure and sometimes he is a little seizure, and so he wasn't quite sure what happened today.  Reports marijuana use.  Reports being compliant with his epileptic medications which include Keppra, lamotrigine, and Depakote. Denies recent illness. No headache, fevers, vomiting, diarrhea, chest pain, or abdominal pain.    Past Medical History:  Diagnosis Date  . Learning disorder   . Seasonal allergies   . Seizures [facility])     Patient Active Problem List   Diagnosis Date Noted  . Right sided weakness 10/23/2016  . Subtherapeutic phenytoin level 10/23/2016  . Hypokalemia 10/23/2016  . Cannabis abuse 10/23/2016  . Toxic metabolic encephalopathy 09/26/2016  . Leukopenia 09/26/2016  . Seizure disorder (HCC) 09/26/2016  . Toxic encephalopathy 09/26/2016  . 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 Seizure (HCC) 04/18/2016  . Epilepsy (HCC) 10/17/2015    Past Surgical History:  Procedure Laterality Date  . HAND SURGERY     right hand surgery  . VASECTOMY Bilateral     Prior to  Admission medications   Medication Sig Start Date End Date Taking? Authorizing Provider  divalproex (DEPAKOTE) 500 MG DR tablet Take 1 tablet (500 mg total) by mouth 2 (two) times daily. 10/23/16  Yes Katharina Caper, MD  lamoTRIgine (LAMICTAL) 100 MG tablet Take 100 mg by mouth 2 (two) times daily.    Yes Historical Provider, MD  lamoTRIgine (LAMICTAL) 25 MG tablet Take 25 mg by mouth 2 (two) times daily.    Yes Historical Provider, MD  levETIRAcetam (KEPPRA) 750 MG tablet Take 1 tablet (750 mg total) by mouth 2 (two) times daily. 10/23/16  Yes Katharina Caper, MD  naproxen sodium (ANAPROX) 220 MG tablet Take 220 mg by mouth 2 (two) times daily with a meal.   Yes Historical Provider, MD  ibuprofen (ADVIL,MOTRIN) 600 MG tablet Take 600 mg by mouth every 6 (six) hours as needed.    Historical Provider, MD    Allergies  Allergen Reactions  . Pollen Extract Other (See Comments)    Runny nose, watery eyes, sneezing    Family History  Problem Relation Age of Onset  . Cancer Father     Unsure of type  . Hypertension Father   . Hypertension Mother   . Diabetes Mellitus II Maternal Grandmother     Social History Social History  Substance Use Topics  . Smoking status: Former Smoker    Packs/day: 0.30    Types: Cigarettes    Quit date: 07/18/2014  . Smokeless tobacco: Never Used     Comment: stopped 4 months ago  . Alcohol use No  Comment: socially    Review of Systems  Constitutional: Negative for fever. Eyes: Negative for visual changes. ENT: Negative for sore throat. Cardiovascular: Negative for chest pain. Respiratory: Negative for shortness of breath. Gastrointestinal: Negative for abdominal pain, vomiting and diarrhea. Genitourinary: Negative for dysuria. Musculoskeletal: Negative for back pain. Skin: Negative for rash. Neurological: Negative for headache. 10 point Review of Systems otherwise negative ____________________________________________   PHYSICAL EXAM:  VITAL  SIGNS: ED Triage Vitals  Enc Vitals Group     BP      Pulse      Resp      Temp      Temp src      SpO2      Weight      Height      Head Circumference      Peak Flow      Pain Score      Pain Loc      Pain Edu?      Excl. in GC?      Constitutional: Alert and oriented. Well appearing and in no distress. HEENT   Head: Normocephalic and atraumatic.      Eyes: Conjunctivae are normal. PERRL. Normal extraocular movements.      Ears:         Nose: No congestion/rhinnorhea.   Mouth/Throat: Mucous membranes are moist.   Neck: No stridor. Cardiovascular/Chest: Normal rate, regular rhythm.  No murmurs, rubs, or gallops. Respiratory: Normal respiratory effort without tachypnea nor retractions. Breath sounds are clear and equal bilaterally. No wheezes/rales/rhonchi. Gastrointestinal: Soft. No distention, no guarding, no rebound. Nontender.    Genitourinary/rectal:Deferred Musculoskeletal: Nontender with normal range of motion in all extremities. No joint effusions.  No lower extremity tenderness.  No edema. Neurologic:  Normal speech and language. No gross or focal neurologic deficits are appreciated. Skin:  Skin is warm, dry and intact. No rash noted. Psychiatric: Mood and affect are normal. Speech and behavior are normal. Patient exhibits appropriate insight and judgment.   ____________________________________________  LABS (pertinent positives/negatives)  Labs Reviewed  BASIC METABOLIC PANEL - Abnormal; Notable for the following:       Result Value   Glucose, Bld 130 (*)    All other components within normal limits  URINALYSIS, COMPLETE (UACMP) WITH MICROSCOPIC - Abnormal; Notable for the following:    Color, Urine YELLOW (*)    APPearance CLEAR (*)    Protein, ur 30 (*)    All other components within normal limits  URINE DRUG SCREEN, QUALITATIVE ([facility] ONLY) - Abnormal; Notable for the following:    Cannabinoid 50 Ng, Ur [place] POSITIVE (*)    All other components  within normal limits  CBC  TROPONIN I  VALPROIC ACID LEVEL  LAMOTRIGINE LEVEL  CBG MONITORING, ED    ____________________________________________    EKG I, Governor Rooksebecca Sarye Kath, MD, the attending physician have personally viewed and interpreted all ECGs.  64 NSR.  Narrow qrs.  Nl axis.  t wave inversion lead 3.  Interference on the baseline of V3. Nonspecific ST/T wave.  Repeat: 77 bpm. Normal sinus rhythm. Narrow QRS. Normal axis. J-point elevation in 1 and aVL with morphology not consistent with ST segment elevation MI.   QTc 416.  No evidence for Wolff-Parkinson-White or Brugada. ____________________________________________  RADIOLOGY All Xrays were viewed by me. Imaging interpreted by Radiologist.  None __________________________________________  PROCEDURES  Procedure(s) performed: None  Critical Care performed: None  ____________________________________________   ED COURSE / ASSESSMENT AND PLAN  Pertinent labs & imaging  results that were available during my care of the patient were reviewed by me and considered in my medical decision making (see chart for details).   Mr. Keith Chung was evaluated for seizure, and he does have a history of what sounds like to tonic-clonic seizures although possibly patient states he's had a "little "seizure in the past. Today's episode sounds more like syncope or near syncope to me. Sounds like he got lightheaded and dizzy and then collapsed but was aware the whole time before he came back without postictal period and no biting of the tongue or incontinence.  He states he's been compliant with his medications, however he does admit to using marijuana and his urine drug screen was consistent with positive for marijuana. I discussed with him this lowers the seizure threshold and to avoid.  He states he follows with Dr. Malvin Johns with [facility] clinic neurology and has an appointment coming up.  He's not had any chest pain or shortness of breath or  trouble breathing. He denies any recent risk factors for hypovolemia. I did give him a liter of fluid here. He is not orthostatic.  Original EKG had some artifact motion in the computer read out for acute MI, I do not see evidence of STEMI or ischemic findings on this EKG and ordered a troponin which is also negative as well as a repeat EKG. He is having no chest discomfort or any ongoing dizziness or headache or neurologic or cardiac symptoms at this point in time.  Laboratory studies are reassuring.  In any case, I think it's okay for discharge home.  I was able speak with the girlfriend who witnessed it, and she states that he sat straight up first and then started shaking. She describes it it sounds like it was most likely a seizure.   I did add on lamotrigine and Depakote levels, he will not stay for these here. He states he's been compliant. He is going to follow-up with Dr. Malvin Johns and asked the patient to let him know levels have been sent.    CONSULTATIONS:  None   Patient / Family / Caregiver informed of clinical course, medical decision-making process, and agree with plan.   I discussed return precautions, follow-up instructions, and discharge instructions with patient and/or family.  Discharge Instructions:  You were evaluated for an episode which may have been a seizure, however as we discussed I think it may have actually been a passing out or nearly passing out episode. In any case, your exam and evaluation are reassuring in the emergency department today.  Please follow up with a primary care doctor and you're provided the [facility] clinic or [facility] numbers. Please also follow up with your neurologist.   Return to the emergency department immediately for any worsening condition including any confusion altered mental status, headache, weakness, numbness, chest pain, palpitations, trouble breathing, passing out, or additional  seizure.  ___________________________________________   FINAL CLINICAL IMPRESSION(S) / ED DIAGNOSES   Final diagnoses:  Syncope, unspecified syncope type  Seizure [facility])              Note: This dictation was prepared with Dragon dictation. Any transcriptional errors that result from this process are unintentional    Governor Rooks, MD 11/30/16 1315

## 2016-11-30 NOTE — ED Triage Notes (Addendum)
Pt arrives via EMS from home, states he woke up this AM and states he had a seizure, hx of epilepsy, at present states headache, awake and alert, EMS reports pt was awake upon their arrival, EDP at bedside

## 2016-11-30 NOTE — ED Notes (Signed)
Pt resting in bed, resp even and unlabored 

## 2016-11-30 NOTE — Discharge Instructions (Signed)
You were evaluated for an episode which may have been a seizure, however as we discussed I think it may have actually been a passing out or nearly passing out episode. In any case, your exam and evaluation are reassuring in the emergency department today.  Please follow up with a primary care doctor and [facility]'re [facility] or [facility] numbers. Please also follow up with your neurologist.   Return to the emergency department immediately for any worsening condition including any confusion altered mental status, headache, weakness, numbness, chest pain, palpitations, trouble breathing, passing out, or additional seizure.

## 2016-12-03 ENCOUNTER — Telehealth: Payer: Self-pay | Admitting: Emergency Medicine

## 2016-12-03 NOTE — Telephone Encounter (Signed)
Called patient to inform of valproic acid level <10  Cell phone is not in service.  Home phone is his mother.  She says he is not around right now.  She will give him message that I said to make sure to follow up with his doctor or he can call me.

## 2016-12-04 LAB — LAMOTRIGINE LEVEL: Lamotrigine Lvl: 5.1 ug/mL (ref 2.0–20.0)

## 2016-12-10 ENCOUNTER — Emergency Department
Admission: EM | Admit: 2016-12-10 | Discharge: 2016-12-10 | Disposition: A | Discharge status: Home / Self Care | Payer: Medicaid Other | Attending: Student in an Organized Health Care Education/Training Program | Admitting: Student in an Organized Health Care Education/Training Program

## 2016-12-10 DIAGNOSIS — G40909 Epilepsy, unspecified, not intractable, without status epilepticus: Secondary | ICD-10-CM | POA: Diagnosis not present

## 2016-12-10 DIAGNOSIS — Z87891 Personal history of nicotine dependence: Secondary | ICD-10-CM | POA: Insufficient documentation

## 2016-12-10 DIAGNOSIS — R569 Unspecified convulsions: Secondary | ICD-10-CM

## 2016-12-10 LAB — CBC
HCT: 36.8 % — ABNORMAL LOW (ref 40.0–52.0)
Hemoglobin: 12.9 g/dL — ABNORMAL LOW (ref 13.0–18.0)
MCH: 29.4 pg (ref 26.0–34.0)
MCHC: 35 g/dL (ref 32.0–36.0)
MCV: 84.1 fL (ref 80.0–100.0)
PLATELETS: 207 10*3/uL (ref 150–440)
RBC: 4.38 MIL/uL — AB (ref 4.40–5.90)
RDW: 12.6 % (ref 11.5–14.5)
WBC: 2.7 10*3/uL — AB (ref 3.8–10.6)

## 2016-12-10 LAB — COMPREHENSIVE METABOLIC PANEL
ALT: 11 U/L — ABNORMAL LOW (ref 17–63)
AST: 17 U/L (ref 15–41)
Albumin: 4.1 g/dL (ref 3.5–5.0)
Alkaline Phosphatase: 42 U/L (ref 38–126)
Anion gap: 6 (ref 5–15)
BUN: 11 mg/dL (ref 6–20)
CHLORIDE: 103 mmol/L (ref 101–111)
CO2: 28 mmol/L (ref 22–32)
Calcium: 8.5 mg/dL — ABNORMAL LOW (ref 8.9–10.3)
Creatinine, Ser: 1.08 mg/dL (ref 0.61–1.24)
Glucose, Bld: 91 mg/dL (ref 65–99)
POTASSIUM: 3.5 mmol/L (ref 3.5–5.1)
SODIUM: 137 mmol/L (ref 135–145)
Total Bilirubin: 0.6 mg/dL (ref 0.3–1.2)
Total Protein: 6.7 g/dL (ref 6.5–8.1)

## 2016-12-10 LAB — VALPROIC ACID LEVEL: Valproic Acid Lvl: 61 ug/mL (ref 50.0–100.0)

## 2016-12-10 NOTE — ED Notes (Signed)
Bed rails padded for seizure precautions.

## 2016-12-10 NOTE — ED Provider Notes (Signed)
[facility] Emergency Department Provider Note    First MD Initiated Contact with Patient 12/10/16 1009     (approximate)  I have reviewed the triage vital signs and the nursing notes.   HISTORY  Chief Complaint Numbness and Seizures    HPI Keith Chung. is a 36 y.o. male with history of seizures and learning disorder presents with complaint of bilateral foot numbness after seizure activity that occurred this morning. Patient states he started seeing spots which is typical for him to see before his seizures. He is getting up to go to the bathroom and then fell to the ground. He then states he does not remember what happened from there but EMS came to pick him up. Patient did not bite his tongue. There is no loss of urinary trouble. States that he started to feel better after the seizure. I asked who called EMS and he says that he did during the seizure by using his emergency number on his phone.   Past Medical History:  Diagnosis Date  . Learning disorder   . Seasonal allergies   . Seizures (HCC)    Family History  Problem Relation Age of Onset  . Cancer Father     Unsure of type  . Hypertension Father   . Hypertension Mother   . Diabetes Mellitus II Maternal Grandmother    Past Surgical History:  Procedure Laterality Date  . HAND SURGERY     right hand surgery  . VASECTOMY Bilateral    Patient Active Problem List   Diagnosis Date Noted  . Right sided weakness 10/23/2016  . Subtherapeutic phenytoin level 10/23/2016  . Hypokalemia 10/23/2016  . Cannabis abuse 10/23/2016  . Toxic metabolic encephalopathy 09/26/2016  . Leukopenia 09/26/2016  . Seizure disorder (HCC) 09/26/2016  . Toxic encephalopathy 09/26/2016  . 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 Seizure (HCC) 04/18/2016  . Epilepsy (HCC) 10/17/2015      Prior to Admission medications   Medication Sig Start Date End Date Taking? Authorizing Provider  divalproex (DEPAKOTE)  500 MG DR tablet Take 1 tablet (500 mg total) by mouth 2 (two) times daily. 10/23/16   Katharina Caper, MD  ibuprofen (ADVIL,MOTRIN) 600 MG tablet Take 600 mg by mouth every 6 (six) hours as needed.    Historical Provider, MD  lamoTRIgine (LAMICTAL) 100 MG tablet Take 100 mg by mouth 2 (two) times daily.     Historical Provider, MD  lamoTRIgine (LAMICTAL) 25 MG tablet Take 25 mg by mouth 2 (two) times daily.     Historical Provider, MD  levETIRAcetam (KEPPRA) 750 MG tablet Take 1 tablet (750 mg total) by mouth 2 (two) times daily. 10/23/16   Katharina Caper, MD  naproxen sodium (ANAPROX) 220 MG tablet Take 220 mg by mouth 2 (two) times daily with a meal.    Historical Provider, MD    Allergies Pollen extract    Social History Social History  Substance Use Topics  . Smoking status: Former Smoker    Packs/day: 0.30    Types: Cigarettes    Quit date: 07/18/2014  . Smokeless tobacco: Never Used     Comment: stopped 4 months ago  . Alcohol use No     Comment: socially    Review of Systems Patient denies headaches, rhinorrhea, blurry vision, numbness, shortness of breath, chest pain, edema, cough, abdominal pain, nausea, vomiting, diarrhea, dysuria, fevers, rashes or hallucinations unless otherwise stated above in HPI. ____________________________________________   PHYSICAL EXAM:  VITAL  SIGNS: Vitals:   12/10/16 0949 12/10/16 0959  BP: (!) 143/103 (!) 132/93  Pulse: 75 88  Resp: 15 18  Temp: 97.8 F (36.6 C) 98 F (36.7 C)    Constitutional: Alert and oriented. Well appearing and in no acute distress. Eyes: Conjunctivae are normal. PERRL. EOMI. Head: Atraumatic. Nose: No congestion/rhinnorhea. Mouth/Throat: Mucous membranes are moist.  Oropharynx non-erythematous. Neck: No stridor. Painless ROM. No cervical spine tenderness to palpation Hematological/Lymphatic/Immunilogical: No cervical lymphadenopathy. Cardiovascular: Normal rate, regular rhythm. Grossly normal heart sounds.   Good peripheral circulation. Respiratory: Normal respiratory effort.  No retractions. Lungs CTAB. Gastrointestinal: Soft and nontender. No distention. No abdominal bruits. No CVA tenderness.  Musculoskeletal: No lower extremity tenderness nor edema.  No joint effusions. Neurologic:  CN- intact.  No facial droop, Normal FNF.  Normal heel to shin.  Sensation intact bilaterally. Normal speech and language. No gross focal neurologic deficits are appreciated. No gait instability.  Skin:  Skin is warm, dry and intact. No rash noted. Psychiatric: Mood and affect are normal. Speech and behavior are normal.  ____________________________________________   LABS (all labs ordered are listed, but only abnormal results are displayed)  No results found for this or any previous visit (from the past 24 hour(s)). ____________________________________________  EKG My review and personal interpretation at Time: 9:52   Indication: seizure  Rate: 70  Rhythm: sinus Axis: normal Other: no STEMI, normal intervals ____________________________________________  RADIOLOGY  I personally reviewed all radiographic images ordered to evaluate for the above acute complaints and reviewed radiology reports and findings.  These findings were personally discussed with the patient.  Please see medical record for radiology report.  ____________________________________________   PROCEDURES  Procedure(s) performed:  Procedures    Critical Care performed: no ____________________________________________   INITIAL IMPRESSION / ASSESSMENT AND PLAN / ED COURSE  Pertinent labs & imaging results that were available during my care of the patient were reviewed by me and considered in my medical decision making (see chart for details).  DDX: seizure, electroyle abnormality, hypoglycemia  Keith Bottomrnest C Wymer Jr. is a 36 y.o. who presents [facility] the ED [name] as well as bilateral lower extremity  numbness. He has no focal neuro deficits at this time. No evidence of head trauma. Do not feel that CT imaging is clinically indicated as this is consistent with his previous episodes and does not have any evidence of head trauma. States that he's had numbness and tingling with his previous postictal periods. We'll check blood work to evaluate for any evidence of electrolyte abnormality. We'll check a valproic acid level. On her.  Clinical Course as of Dec 11 1450  Mon Dec 10, 2016  1253 Patient now back to baseline. Patient was able to tolerate PO and was able to ambulate with a steady gait.  No acute distress. Requesting discharge home. Blood work is reassuring. Able to ambulate with a steady gait.  Will refer to continue follow up with neurology.  [PR]    Clinical Course User Index [PR] Willy EddyPatrick Halie Gass, MD     ____________________________________________   FINAL CLINICAL IMPRESSION(S) / ED DIAGNOSES  Final diagnoses:  Seizure-like activity (HCC)      NEW MEDICATIONS STARTED DURING THIS VISIT:  New Prescriptions   No medications on file     Note:  This document was prepared using Dragon voice recognition software and may include unintentional dictation errors.    Willy EddyPatrick Otis Portal, MD 12/10/16 [phone]

## 2016-12-10 NOTE — ED Triage Notes (Signed)
Pt here from home via [facility] with c/o bilateral feet numbness after unknown multiple seizures today. Pt with hx of seizures, reports he has been taking medications. Pt a/o upon arrival.

## 2016-12-12 ENCOUNTER — Emergency Department
Admission: EM | Admit: 2016-12-12 | Discharge: 2016-12-12 | Disposition: A | Discharge status: Home / Self Care | Payer: Medicaid Other | Attending: Emergency Medicine | Admitting: Emergency Medicine

## 2016-12-12 DIAGNOSIS — Z87891 Personal history of nicotine dependence: Secondary | ICD-10-CM | POA: Diagnosis not present

## 2016-12-12 DIAGNOSIS — Z79899 Other long term (current) drug therapy: Secondary | ICD-10-CM | POA: Insufficient documentation

## 2016-12-12 DIAGNOSIS — G4459 Other complicated headache syndrome: Secondary | ICD-10-CM

## 2016-12-12 DIAGNOSIS — R51 Headache: Secondary | ICD-10-CM | POA: Diagnosis present

## 2016-12-12 MED ORDER — IBUPROFEN 800 MG PO TABS
800.0000 mg | ORAL_TABLET | Freq: Once | ORAL | Status: AC
  Administered 2016-12-12: 800 mg via ORAL
  Filled 2016-12-12: qty 1

## 2016-12-12 NOTE — ED Provider Notes (Signed)
[facility] Emergency Department Provider Note   First MD Initiated Contact with Patient 12/12/16 [phone]     (approximate)  I have reviewed the triage vital signs and the nursing notes.  History Limited secondary to patient reluctance to answer questions. HISTORY  Chief Complaint Headache   HPI Sarita Bottomrnest C Desai Jr. is a 36 y.o. male presents via EMS with complaint of a headache. Patient states that he woke this morning with a headache that is generalized. Patient denies any weakness numbness gait instability or visual changes. Patient states that he did not take any medication for his headache before arrival.   Past Medical History:  Diagnosis Date  . Learning disorder   . Seasonal allergies   . Seizures [facility](HCC)     Patient Active Problem List   Diagnosis Date Noted  . Right sided weakness 10/23/2016  . Subtherapeutic phenytoin level 10/23/2016  . Hypokalemia 10/23/2016  . Cannabis abuse 10/23/2016  . Toxic metabolic encephalopathy 09/26/2016  . Leukopenia 09/26/2016  . Seizure disorder (HCC) 09/26/2016  . Toxic encephalopathy 09/26/2016  . 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 Seizure (HCC) 04/18/2016  . Epilepsy (HCC) 10/17/2015    Past Surgical History:  Procedure Laterality Date  . HAND SURGERY     right hand surgery  . VASECTOMY Bilateral     Prior to Admission medications   Medication Sig Start Date End Date Taking? Authorizing Provider  divalproex (DEPAKOTE) 500 MG DR tablet Take 1 tablet (500 mg total) by mouth 2 (two) times daily. 10/23/16   Katharina Caperima Vaickute, MD  ibuprofen (ADVIL,MOTRIN) 600 MG tablet Take 600 mg by mouth every 6 (six) hours as needed.    Historical Provider, MD  lamoTRIgine (LAMICTAL) 100 MG tablet Take 100 mg by mouth 2 (two) times daily.     Historical Provider, MD  lamoTRIgine (LAMICTAL) 25 MG tablet Take 25 mg by mouth 2 (two) times daily.     Historical Provider, MD  levETIRAcetam (KEPPRA) 750 MG tablet Take 1  tablet (750 mg total) by mouth 2 (two) times daily. 10/23/16   Katharina Caperima [place], MD  naproxen sodium (ANAPROX) 220 MG tablet Take 220 mg by mouth 2 (two) times daily with a meal.    Historical Provider, MD    Allergies Pollen extract  Family History  Problem Relation Age of Onset  . Cancer Father     Unsure of type  . Hypertension Father   . Hypertension Mother   . Diabetes Mellitus II Maternal Grandmother     Social History Social History  Substance Use Topics  . Smoking status: Former Smoker    Packs/day: 0.30    Types: Cigarettes    Quit date: 07/18/2014  . Smokeless tobacco: Never Used     Comment: stopped 4 months ago  . Alcohol use No     Comment: socially    Review of Systems Constitutional: No fever/chills Eyes: No visual changes. ENT: No sore throat. Cardiovascular: Denies chest pain. Respiratory: Denies shortness of breath. Gastrointestinal: No abdominal pain.  No nausea, no vomiting.  No diarrhea.  No constipation. Genitourinary: Negative for dysuria. Musculoskeletal: Negative for back pain. Skin: Negative for rash. Neurological: Positive for headaches, negative for focal weakness or numbness.  10-point ROS otherwise negative.  ____________________________________________   PHYSICAL EXAM:  VITAL SIGNS: ED Triage Vitals  Enc Vitals Group     BP 12/12/16 0521 137/90     Pulse Rate 12/12/16 0521 67     Resp 12/12/16 0521 16  Temp 12/12/16 0521 97.8 F (36.6 C)     Temp Source 12/12/16 0521 Oral     SpO2 12/12/16 0521 98 %     Weight 12/12/16 0516 166 lb (75.3 kg)     Height 12/12/16 0516 5\' 10"  (1.778 m)     Head Circumference --      Peak Flow --      Pain Score 12/12/16 0515 9     Pain Loc --      Pain Edu? --      Excl. in GC? --     Constitutional: Alert and oriented. Well appearing and in no acute distress. Eyes: Conjunctivae are normal. PERRL. EOMI. Head: Atraumatic. Mouth/Throat: Mucous membranes are moist.  Oropharynx  non-erythematous. Neck: No stridor.  No meningeal signs.  Cardiovascular: Normal rate, regular rhythm. Good peripheral circulation. Grossly normal heart sounds. Respiratory: Normal respiratory effort.  No retractions. Lungs CTAB. Gastrointestinal: Soft and nontender. No distention.  Musculoskeletal: No lower extremity tenderness nor edema. No gross deformities of extremities. Neurologic:  Normal speech and language. No gross focal neurologic deficits are appreciated.  Skin:  Skin is warm, dry and intact. No rash noted. Psychiatric: Mood and affect are normal. Speech and behavior are normal.    Procedures   ____________________________________________   INITIAL IMPRESSION / ASSESSMENT AND PLAN / ED COURSE  Pertinent labs & imaging results that were available during my care of the patient were reviewed by me and considered in my medical decision making (see chart for details).  Patient presented with headache with no focal neurological deficits. Patient does have multiple CT scans most recent of which 10/21/2016 revealed no acute abnormality.      ____________________________________________  FINAL CLINICAL IMPRESSION(S) / ED DIAGNOSES  Headache  MEDICATIONS GIVEN DURING THIS VISIT:  Medications  ibuprofen (ADVIL,MOTRIN) tablet 800 mg (800 mg Oral Given 12/12/16 0533)     NEW OUTPATIENT MEDICATIONS STARTED DURING THIS VISIT:  New Prescriptions   No medications on file    Modified Medications   No medications on file    Discontinued Medications   No medications on file     Note:  This document was prepared using Dragon voice recognition software and may include unintentional dictation errors.    Darci Current, MD 12/12/16 [phone]

## 2016-12-12 NOTE — ED Triage Notes (Addendum)
Pt presents to ED via [facility] from the [facility] where he lives. EMS states pt called out for c/o headache and chest pain. 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 Pt appears agitated, seems hesitant and not-forthcoming with answering Triage RNs questions. Dr Manson PasseyBrown at bedside upon pt's arrival.

## 2017-02-10 ENCOUNTER — Emergency Department
Admission: EM | Admit: 2017-02-10 | Discharge: 2017-02-10 | Disposition: A | Discharge status: Home / Self Care | Payer: Medicaid Other | Attending: Emergency Medicine | Admitting: Emergency Medicine

## 2017-02-10 ENCOUNTER — Emergency Department: Payer: Medicaid Other

## 2017-02-10 ENCOUNTER — Encounter: Payer: Self-pay | Admitting: Emergency Medicine

## 2017-02-10 DIAGNOSIS — Y929 Unspecified place or not applicable: Secondary | ICD-10-CM | POA: Diagnosis not present

## 2017-02-10 DIAGNOSIS — R569 Unspecified convulsions: Secondary | ICD-10-CM

## 2017-02-10 DIAGNOSIS — X58XXXA Exposure to other specified factors, initial encounter: Secondary | ICD-10-CM | POA: Diagnosis not present

## 2017-02-10 DIAGNOSIS — Y939 Activity, unspecified: Secondary | ICD-10-CM | POA: Diagnosis not present

## 2017-02-10 DIAGNOSIS — Y998 Other external cause status: Secondary | ICD-10-CM | POA: Insufficient documentation

## 2017-02-10 DIAGNOSIS — Z87891 Personal history of nicotine dependence: Secondary | ICD-10-CM | POA: Diagnosis not present

## 2017-02-10 DIAGNOSIS — Z79899 Other long term (current) drug therapy: Secondary | ICD-10-CM | POA: Insufficient documentation

## 2017-02-10 DIAGNOSIS — S0083XA Contusion of other part of head, initial encounter: Secondary | ICD-10-CM | POA: Diagnosis not present

## 2017-02-10 LAB — COMPREHENSIVE METABOLIC PANEL WITH GFR
ALT: 9 U/L — ABNORMAL LOW (ref 17–63)
AST: 29 U/L (ref 15–41)
Albumin: 4.5 g/dL (ref 3.5–5.0)
Alkaline Phosphatase: 51 U/L (ref 38–126)
Anion gap: 14 (ref 5–15)
BUN: 12 mg/dL (ref 6–20)
CO2: 20 mmol/L — ABNORMAL LOW (ref 22–32)
Calcium: 9.4 mg/dL (ref 8.9–10.3)
Chloride: 105 mmol/L (ref 101–111)
Creatinine, Ser: 1.18 mg/dL (ref 0.61–1.24)
GFR calc Af Amer: >60 mL/min (ref >60)
GFR calc non Af Amer: >60 mL/min (ref >60)
Glucose, Bld: 115 mg/dL — ABNORMAL HIGH (ref 65–99)
Potassium: 4.6 mmol/L (ref 3.5–5.1)
Sodium: 139 mmol/L (ref 135–145)
Total Bilirubin: 0.4 mg/dL (ref 0.3–1.2)
Total Protein: 7.8 g/dL (ref 6.5–8.1)

## 2017-02-10 LAB — CBC WITH DIFFERENTIAL/PLATELET
Basophils Absolute: 0.1 K/uL (ref 0–0.1)
Basophils Relative: 1 %
Eosinophils Absolute: 0.2 K/uL (ref 0–0.7)
Eosinophils Relative: 4 %
HCT: 42 % (ref 40.0–52.0)
Hemoglobin: 14.1 g/dL (ref 13.0–18.0)
Lymphocytes Relative: 33 %
Lymphs Abs: 1.8 K/uL (ref 1.0–3.6)
MCH: 29.9 pg (ref 26.0–34.0)
MCHC: 33.7 g/dL (ref 32.0–36.0)
MCV: 88.9 fL (ref 80.0–100.0)
Monocytes Absolute: 0.4 K/uL (ref 0.2–1.0)
Monocytes Relative: 8 %
Neutro Abs: 3 K/uL (ref 1.4–6.5)
Neutrophils Relative %: 54 %
Platelets: 252 K/uL (ref 150–440)
RBC: 4.72 MIL/uL (ref 4.40–5.90)
RDW: 12.4 % (ref 11.5–14.5)
WBC: 5.5 K/uL (ref 3.8–10.6)

## 2017-02-10 NOTE — Discharge Instructions (Signed)
Please be sure to take your medicines as directed every day. Please call your neurologist Tuesday morning and arrange follow-up. If the medicines are making U lightheaded please tell him he may be up to adjust the dose or perhaps even changed to medicine.

## 2017-02-10 NOTE — ED Provider Notes (Signed)
[facility] Emergency Department Provider Note   ____________________________________________   First MD Initiated Contact with Patient 02/10/17 [phone]     (approximate)  I have reviewed the triage vital signs and the nursing notes.   HISTORY  Chief Complaint Seizures    HPI Keith Nestor. is a 36 y.o. male who had a seizure today. Mom reports she saw him banging his head on the ground. EMS arrived he was postictal since woken up. EMS reports his Lamictal and Keppra should be empty by the dates that were prescribed but the bottles are mostly. Has not been taking his medicines regularly. He said he did take his medicine this morning. He has some bruising on his forehead in 2 spots and his nose is tender and may be broken.   Past Medical History:  Diagnosis Date  . Learning disorder   . Seasonal allergies   . Seizures [facility])     Patient Active Problem List   Diagnosis Date Noted  . Right sided weakness 10/23/2016  . Subtherapeutic phenytoin level 10/23/2016  . Hypokalemia 10/23/2016  . Cannabis abuse 10/23/2016  . Toxic metabolic encephalopathy 09/26/2016  . Leukopenia 09/26/2016  . Seizure disorder (HCC) 09/26/2016  . Toxic encephalopathy 09/26/2016  . 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 Seizure (HCC) 04/18/2016  . Epilepsy (HCC) 10/17/2015    Past Surgical History:  Procedure Laterality Date  . HAND SURGERY     right hand surgery  . VASECTOMY Bilateral     Prior to Admission medications   Medication Sig Start Date End Date Taking? Authorizing Provider  divalproex (DEPAKOTE) 500 MG DR tablet Take 1 tablet (500 mg total) by mouth 2 (two) times daily. 10/23/16   Katharina Caper, MD  ibuprofen (ADVIL,MOTRIN) 600 MG tablet Take 600 mg by mouth every 6 (six) hours as needed.    [provider]  lamoTRIgine (LAMICTAL) 100 MG tablet Take 100 mg by mouth 2 (two) times daily.     [provider]  lamoTRIgine (LAMICTAL) 25 MG  tablet Take 25 mg by mouth 2 (two) times daily.     [provider]  levETIRAcetam (KEPPRA) 750 MG tablet Take 1 tablet (750 mg total) by mouth 2 (two) times daily. 10/23/16   Katharina Caper, MD  naproxen sodium (ANAPROX) 220 MG tablet Take 220 mg by mouth 2 (two) times daily with a meal.    [provider]    Allergies Pollen extract  Family History  Problem Relation Age of Onset  . Cancer Father        Unsure of type  . Hypertension Father   . Hypertension Mother   . Diabetes Mellitus II Maternal Grandmother     Social History Social History  Substance Use Topics  . Smoking status: Former Smoker    Packs/day: 0.30    Types: Cigarettes    Quit date: 07/18/2014  . Smokeless tobacco: Never Used     Comment: stopped 4 months ago  . Alcohol use No     Comment: socially    Review of Systems  Constitutional: No fever/chills Eyes: No visual changes. ENT: No sore throat. Cardiovascular: Denies chest pain. Respiratory: Denies shortness of breath. Gastrointestinal: No abdominal pain.  No nausea, no vomiting.  No diarrhea.  No constipation. Genitourinary: Negative for dysuria. Musculoskeletal: Negative for back pain. Skin: Negative for rash. Neurological: Negative for focal weakness or numbness.   ____________________________________________   PHYSICAL EXAM:  VITAL SIGNS: ED Triage Vitals  Enc  Vitals Group     BP      Pulse      Resp      Temp      Temp src      SpO2      Weight      Height      Head Circumference      Peak Flow      Pain Score      Pain Loc      Pain Edu?      Excl. in GC?     Constitutional: Alert and oriented. Well appearing and in no acute distress. Eyes: Conjunctivae are normal. PERRL. EOMI. Head: Atraumatic Except for 2 bruises one of the right side of his forehead one in the middle of his forehead and the some tenderness in his nose with 2 small less than 1 mm scabs. Nose: No congestion/rhinnorhea. There is no nasal  septal hematoma Mouth/Throat: Mucous membranes are moist.   Neck: No stridor. Cardiovascular: Normal rate, regular rhythm. Grossly normal heart sounds.  Good peripheral circulation. Respiratory: Normal respiratory effort.  No retractions. Lungs CTAB. Gastrointestinal: Soft and nontender. No distention. No abdominal bruits. No CVA tenderness. }Musculoskeletal: No lower extremity tenderness nor edema.  No joint effusions. Neurologic:  Normal speech and language. No gross focal neurologic deficits are appreciated cranial nerves II through XII are intact except visual fields are not checked rapid alternating movements were not checked with finger to nose is normal. Motor strength is 5 over 5 throughout sensation is intact throughout. No gait instability. Skin:  Skin is warm, dry and intact. No rash noted. Psychiatric: Mood and affect are normal. Speech and behavior are normal.  ____________________________________________   LABS (all labs ordered are listed, but only abnormal results are displayed)  Labs Reviewed  COMPREHENSIVE METABOLIC PANEL - Abnormal; Notable for the following:       Result Value   CO2 20 (*)    Glucose, Bld 115 (*)    ALT 9 (*)    All other components within normal limits  CBC WITH DIFFERENTIAL/PLATELET   ____________________________________________  EKG   ____________________________________________  RADIOLOGY  IMPRESSION: Normal head CT.  Negative maxillofacial CT.   Electronically Signed   By: Charline BillsSriyesh  Krishnan M.D.   On: 02/10/2017 08:20 ____________________________________________   PROCEDURES  Procedure(s) performed:   Procedures  Critical Care performed:   ____________________________________________   INITIAL IMPRESSION / ASSESSMENT AND PLAN / ED COURSE  Pertinent labs & imaging results that were available during my care of the patient were reviewed by me and considered in my medical decision making (see chart for  details).   Labs look normal CTs are normal patient has not been taking his medicines I will discharge him with the recommendation take his medicine follow-up with his neurologist     ____________________________________________   FINAL CLINICAL IMPRESSION(S) / ED DIAGNOSES  Final diagnoses:  Seizure (HCC)      NEW MEDICATIONS STARTED DURING THIS VISIT:  New Prescriptions   No medications on file     Note:  This document was prepared using Dragon voice recognition software and may include unintentional dictation errors.    Arnaldo NatalMalinda, Adonia Porada F, MD 02/10/17 [phone]

## 2017-02-10 NOTE — ED Triage Notes (Signed)
Pt to ED via EMS from home c/o seizure this morning.  Pt with hx of seizures, per EMS patient mother heard patient fall and saw patient shaking on floor hitting head.  Presents with bruising to face and forehead, postictal en route until arrival.  Patient supposed to be taking lamictal and keppra but not taking medications.  EMS vitals: 122/69 BP, 95% RA, 80 HR, 107 CBG, 97.4 temp.

## 2017-02-19 ENCOUNTER — Emergency Department
Admission: EM | Admit: 2017-02-19 | Discharge: 2017-02-19 | Disposition: A | Discharge status: Home / Self Care | Payer: Medicaid Other | Attending: Emergency Medicine | Admitting: Emergency Medicine

## 2017-02-19 DIAGNOSIS — Z9114 Patient's other noncompliance with medication regimen: Secondary | ICD-10-CM | POA: Insufficient documentation

## 2017-02-19 DIAGNOSIS — Z87891 Personal history of nicotine dependence: Secondary | ICD-10-CM | POA: Insufficient documentation

## 2017-02-19 DIAGNOSIS — Z79899 Other long term (current) drug therapy: Secondary | ICD-10-CM | POA: Insufficient documentation

## 2017-02-19 DIAGNOSIS — R569 Unspecified convulsions: Secondary | ICD-10-CM | POA: Diagnosis present

## 2017-02-19 HISTORY — PX: MULTIPLE TOOTH EXTRACTIONS: SHX2053

## 2017-02-19 LAB — VALPROIC ACID LEVEL: VALPROIC ACID LVL: 39 ug/mL — AB (ref 50.0–100.0)

## 2017-02-19 MED ORDER — LAMOTRIGINE 25 MG PO TABS
125.0000 mg | ORAL_TABLET | Freq: Once | ORAL | Status: AC
  Administered 2017-02-19: 125 mg via ORAL
  Filled 2017-02-19: qty 1

## 2017-02-19 MED ORDER — IBUPROFEN 600 MG PO TABS
600.0000 mg | ORAL_TABLET | Freq: Once | ORAL | Status: AC
  Administered 2017-02-19: 600 mg via ORAL

## 2017-02-19 MED ORDER — IBUPROFEN 600 MG PO TABS
ORAL_TABLET | ORAL | Status: AC
  Filled 2017-02-19: qty 1

## 2017-02-19 NOTE — ED Notes (Signed)
Pt waiting for his mother to arrive to pick pt up, pt was wheeled in wheelchair to lobby to see his mother pull up. Triage RN's notified of pt and where he was sitting and hx of seizures and that his mother is on the way to pick him up.

## 2017-02-19 NOTE — ED Provider Notes (Signed)
[facility] Emergency Department Provider Note  ____________________________________________   First MD Initiated Contact with Patient 02/19/17 2129     (approximate)  I have reviewed the triage vital signs and the nursing notes.   HISTORY  Chief Complaint Seizures   HPI Keith Chung. is a 36 y.o. male with a history of seizure disorder who is presenting after seizure. He says that he had several teeth pulled this afternoon and afterwards felt weak and not himself. He then proceeded to have a seizure. He says was witnessed by his mother but is unsure about how long it lasted. He denies any headache now or any neck pain. Says that he feels weak and achy but this is typical for him after seizures. He says that he has been compliant with his medications including Keppra, lamotrigine and Depakote. Says that he was feeling blood seeping from the dental incisions.   Past Medical History:  Diagnosis Date  . Learning disorder   . Seasonal allergies   . Seizures [facility])     Patient Active Problem List   Diagnosis Date Noted  . Right sided weakness 10/23/2016  . Subtherapeutic phenytoin level 10/23/2016  . Hypokalemia 10/23/2016  . Cannabis abuse 10/23/2016  . Toxic metabolic encephalopathy 09/26/2016  . Leukopenia 09/26/2016  . Seizure disorder (HCC) 09/26/2016  . Toxic encephalopathy 09/26/2016  . 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 Seizure (HCC) 04/18/2016  . Epilepsy (HCC) 10/17/2015    Past Surgical History:  Procedure Laterality Date  . HAND SURGERY     right hand surgery  . MULTIPLE TOOTH EXTRACTIONS  02/19/2017  . VASECTOMY Bilateral     Prior to Admission medications   Medication Sig Start Date End Date Taking? Authorizing Provider  divalproex (DEPAKOTE) 500 MG DR tablet Take 1 tablet (500 mg total) by mouth 2 (two) times daily. 10/23/16   Katharina Caper, MD  ibuprofen (ADVIL,MOTRIN) 600 MG tablet Take 600 mg by mouth every 6 (six)  hours as needed.    [provider]  lamoTRIgine (LAMICTAL) 100 MG tablet Take 100 mg by mouth 2 (two) times daily.     [provider]  lamoTRIgine (LAMICTAL) 25 MG tablet Take 25 mg by mouth 2 (two) times daily.     [provider]  levETIRAcetam (KEPPRA) 750 MG tablet Take 1 tablet (750 mg total) by mouth 2 (two) times daily. 10/23/16   Katharina Caper, MD  naproxen sodium (ANAPROX) 220 MG tablet Take 220 mg by mouth 2 (two) times daily with a meal.    [provider]    Allergies Pollen extract  Family History  Problem Relation Age of Onset  . Cancer Father        Unsure of type  . Hypertension Father   . Hypertension Mother   . Diabetes Mellitus II Maternal Grandmother     Social History Social History  Substance Use Topics  . Smoking status: Former Smoker    Packs/day: 0.30    Types: Cigarettes    Quit date: 07/18/2014  . Smokeless tobacco: Never Used     Comment: stopped 4 months ago  . Alcohol use No     Comment: socially    Review of Systems  Constitutional: No fever/chills Eyes: No visual changes. ENT: No sore throat. Cardiovascular: Denies chest pain. Respiratory: Denies shortness of breath. Gastrointestinal: No abdominal pain.  No nausea, no vomiting.  No diarrhea.  No constipation. Genitourinary: Negative for dysuria. Musculoskeletal: Negative for back pain.  Skin: Negative for rash. Neurological: Negative for headaches, focal weakness or numbness.   ____________________________________________   PHYSICAL EXAM:  VITAL SIGNS: ED Triage Vitals  Enc Vitals Group     BP 02/19/17 2100 131/90     Pulse Rate 02/19/17 2100 73     Resp 02/19/17 2100 14     Temp 02/19/17 2100 98.7 F (37.1 C)     Temp Source 02/19/17 2100 Oral     SpO2 02/19/17 2100 98 %     Weight 02/19/17 2100 165 lb (74.8 kg)     Height 02/19/17 2100 6\' 2"  (1.88 m)     Head Circumference --      Peak Flow --      Pain Score 02/19/17 2059 9     Pain  Loc --      Pain Edu? --      Excl. in GC? --     Constitutional: Alert and oriented. Well appearing and in no acute distress. Eyes: Conjunctivae are normal.  Head: Atraumatic. Nose: No congestion/rhinnorhea. Mouth/Throat: Mucous membranes are moist. Multiple molar extraction site visualized with sutures intact. No active bleeding. No swelling, no trismus. No obvious tongue bite. Neck: No stridor.  No tenderness to palpation at midline cervical spine. No deformity or step-off. Cardiovascular: Normal rate, regular rhythm. Grossly normal heart sounds.   Respiratory: Normal respiratory effort.  No retractions. Lungs CTAB. Gastrointestinal: Soft and nontender. No distention.  Musculoskeletal: No lower extremity tenderness nor edema.  No joint effusions. Neurologic:  Normal speech and language. No gross focal neurologic deficits are appreciated. Skin:  Skin is warm, dry and intact. No rash noted. Psychiatric: Mood and affect are normal. Speech and behavior are normal.  ____________________________________________   LABS (all labs ordered are listed, but only abnormal results are displayed)  Labs Reviewed  VALPROIC ACID LEVEL - Abnormal; Notable for the following:       Result Value   Valproic Acid Lvl 39 (*)    All other components within normal limits   ____________________________________________  EKG   ____________________________________________  RADIOLOGY   ____________________________________________   PROCEDURES  Procedure(s) performed:   Procedures  Critical Care performed:   ____________________________________________   INITIAL IMPRESSION / ASSESSMENT AND PLAN / ED COURSE  Pertinent labs & imaging results that were available during my care of the patient were reviewed by me and considered in my medical decision making (see chart for details).      ----------------------------------------- 11:18 PM on  02/19/2017 -----------------------------------------  Patient with nonfocal neuro exam and now without any complaints. Says that he has been out of his Lamictal since May 30. I checked his care everywhere and he had a prescription called in by his neurologist on the 31st. He says that he will be able to pick up this prescription tomorrow. He will be given his evening dose of Lomotil here in the hospital. He says that he will take his evening some Depakote when he gets home. He was found to be slightly subtherapeutic on his Depakote. He will follow-up with his neurologist. Likely seizure secondary to medication noncompliance.  ____________________________________________   FINAL CLINICAL IMPRESSION(S) / ED DIAGNOSES  Seizure.    NEW MEDICATIONS STARTED DURING THIS VISIT:  New Prescriptions   No medications on file     Note:  This document was prepared using Dragon voice recognition software and may include unintentional dictation errors.     Keith Chung, Keith Mcfayden Matthew, MD 02/19/17 [phone]

## 2017-02-19 NOTE — ED Triage Notes (Signed)
Per EMS, pt brought to ER post seizure, pt states he has hx of grand mal seizures. EMS also reports pt was seen today by dentist for teeth extractions, pt is reporting pain to left side of face at this time. Pt is alert at this time and able to answer questions. Blood noted to right side of pt's mouth, EMS reports this is from the removal sites of his teeth taken today, no obvious injury noted to pt's tongue. Per EMS, CBG 96, HR 102.

## 2017-03-22 ENCOUNTER — Emergency Department
Admission: EM | Admit: 2017-03-22 | Discharge: 2017-03-22 | Disposition: A | Discharge status: Home / Self Care | Payer: Medicaid Other | Attending: Student in an Organized Health Care Education/Training Program | Admitting: Student in an Organized Health Care Education/Training Program

## 2017-03-22 ENCOUNTER — Encounter: Payer: Self-pay | Admitting: Emergency Medicine

## 2017-03-22 ENCOUNTER — Emergency Department: Payer: Medicaid Other

## 2017-03-22 DIAGNOSIS — F819 Developmental disorder of scholastic skills, unspecified: Secondary | ICD-10-CM | POA: Diagnosis not present

## 2017-03-22 DIAGNOSIS — G8384 Todd's paralysis (postepileptic): Secondary | ICD-10-CM

## 2017-03-22 DIAGNOSIS — R569 Unspecified convulsions: Secondary | ICD-10-CM | POA: Insufficient documentation

## 2017-03-22 DIAGNOSIS — Z79899 Other long term (current) drug therapy: Secondary | ICD-10-CM | POA: Diagnosis not present

## 2017-03-22 LAB — BASIC METABOLIC PANEL
Anion gap: 7 (ref 5–15)
BUN: 11 mg/dL (ref 6–20)
CHLORIDE: 101 mmol/L (ref 101–111)
CO2: 28 mmol/L (ref 22–32)
CREATININE: 1.19 mg/dL (ref 0.61–1.24)
Calcium: 9.2 mg/dL (ref 8.9–10.3)
GFR calc Af Amer: >60 mL/min (ref >60)
GFR calc non Af Amer: >60 mL/min (ref >60)
GLUCOSE: 93 mg/dL (ref 65–99)
Potassium: 4.1 mmol/L (ref 3.5–5.1)
Sodium: 136 mmol/L (ref 135–145)

## 2017-03-22 MED ORDER — SODIUM CHLORIDE 0.9 % IV SOLN
1000.0000 mg | Freq: Once | INTRAVENOUS | Status: AC
  Administered 2017-03-22: 1000 mg via INTRAVENOUS
  Filled 2017-03-22: qty 10

## 2017-03-22 MED ORDER — LAMOTRIGINE 100 MG PO TABS
100.0000 mg | ORAL_TABLET | Freq: Once | ORAL | Status: AC
  Administered 2017-03-22: 100 mg via ORAL
  Filled 2017-03-22: qty 1

## 2017-03-22 NOTE — ED Notes (Signed)
Patient transported to CT 

## 2017-03-22 NOTE — ED Triage Notes (Signed)
Pt arrived via EMS from home with reports of seizure-like activity. Pt has hx of seizures and is on medication and sees a neurologist.  Mother reports she heard a loud noise in his room and states she called his name and he didn't answer. When she went to his room he was clenched and not responding.

## 2017-03-22 NOTE — ED Provider Notes (Signed)
[facility] Emergency Department Provider Note    First MD Initiated Contact with Patient 03/22/17 1156     (approximate)  I have reviewed the triage vital signs and the nursing notes.   HISTORY  Chief Complaint Seizures    HPI Keith Bottomrnest C Grismer Jr. is a 36 y.o. male with a history of seizures on Keppra and Lamictal presents with brief seizure-like episode that was unwitnessed today at home. Mother heard the patient fall to the ground and she immediately went to the room and saw him with a generalized tonic-clonic shaking spell that lasted roughly 1 minute and then spontaneously aborted followed by a postictal period.  Patient complains of a headache right now and has a history of the same after seizures. Denies missing any doses of his medications. Denies any chest pain.   Past Medical History:  Diagnosis Date  . Learning disorder   . Seasonal allergies   . Seizures (HCC)    Family History  Problem Relation Age of Onset  . Cancer Father        Unsure of type  . Hypertension Father   . Hypertension Mother   . Diabetes Mellitus II Maternal Grandmother    Past Surgical History:  Procedure Laterality Date  . HAND SURGERY     right hand surgery  . MULTIPLE TOOTH EXTRACTIONS  02/19/2017  . VASECTOMY Bilateral    Patient Active Problem List   Diagnosis Date Noted  . Right sided weakness 10/23/2016  . Subtherapeutic phenytoin level 10/23/2016  . Hypokalemia 10/23/2016  . Cannabis abuse 10/23/2016  . Toxic metabolic encephalopathy 09/26/2016  . Leukopenia 09/26/2016  . Seizure disorder (HCC) 09/26/2016  . Toxic encephalopathy 09/26/2016  . 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 Seizure (HCC) 04/18/2016  . Epilepsy (HCC) 10/17/2015      Prior to Admission medications   Medication Sig Start Date End Date Taking? Authorizing Provider  divalproex (DEPAKOTE) 500 MG DR tablet Take 1 tablet (500 mg total) by mouth 2 (two) times daily. 10/23/16    Katharina CaperVaickute, Rima, MD  ibuprofen (ADVIL,MOTRIN) 600 MG tablet Take 600 mg by mouth every 6 (six) hours as needed.    [provider]  lamoTRIgine (LAMICTAL) 100 MG tablet Take 100 mg by mouth 2 (two) times daily.     [provider]  lamoTRIgine (LAMICTAL) 25 MG tablet Take 25 mg by mouth 2 (two) times daily.     [provider]  levETIRAcetam (KEPPRA) 750 MG tablet Take 1 tablet (750 mg total) by mouth 2 (two) times daily. 10/23/16   Katharina CaperVaickute, Rima, MD  naproxen sodium (ANAPROX) 220 MG tablet Take 220 mg by mouth 2 (two) times daily with a meal.    [provider]    Allergies Pollen extract    Social History Social History  Substance Use Topics  . Smoking status: Former Smoker    Packs/day: 0.30    Types: Cigarettes    Quit date: 07/18/2014  . Smokeless tobacco: Never Used     Comment: stopped 4 months ago  . Alcohol use No     Comment: socially    Review of Systems Patient denies headaches, rhinorrhea, blurry vision, numbness, shortness of breath, chest pain, edema, cough, abdominal pain, nausea, vomiting, diarrhea, dysuria, fevers, rashes or hallucinations unless otherwise stated above in HPI. ____________________________________________   PHYSICAL EXAM:  VITAL SIGNS: Vitals:   03/22/17 1220  BP: 125/79  Pulse: 81  Resp: 18  Temp: (!) 97.4 F (36.3  C)    Constitutional: slightly drowsy and postictal appearing in no acute distress. Eyes: Conjunctivae are normal.  Head: Atraumatic. Nose: No congestion/rhinnorhea. Mouth/Throat: Mucous membranes are moist.   Neck: No stridor. Painless ROM.  Cardiovascular: Normal rate, regular rhythm. Grossly normal heart sounds.  Good peripheral circulation. Respiratory: Normal respiratory effort.  No retractions. Lungs CTAB. Gastrointestinal: Soft and nontender. No distention. No abdominal bruits. No CVA tenderness. Musculoskeletal: No lower extremity tenderness nor edema.  No joint  effusions. Neurologic:  drowsy No gross focal neurologic deficits are appreciated. No facial droop Skin:  Skin is warm, dry and intact. No rash noted. Psychiatric: Mood and affect are normal. Speech and behavior are normal.  ____________________________________________   LABS (all labs ordered are listed, but only abnormal results are displayed)  Results for orders placed or performed during the hospital encounter of 03/22/17 (from the past 24 hour(s))  Basic metabolic panel     Status: None   Collection Time: 03/22/17 12:05 PM  Result Value Ref Range   Sodium 136 135 - 145 mmol/L   Potassium 4.1 3.5 - 5.1 mmol/L   Chloride 101 101 - 111 mmol/L   CO2 28 22 - 32 mmol/L   Glucose, Bld 93 65 - 99 mg/dL   BUN 11 6 - 20 mg/dL   Creatinine, Ser 4.54 0.61 - 1.24 mg/dL   Calcium 9.2 8.9 - 09.8 mg/dL   GFR calc non Af Amer >60 >60 mL/min   GFR calc Af Amer >60 >60 mL/min   Anion gap 7 5 - 15   ____________________________________________ ____________________________________  RADIOLOGY  I personally reviewed all radiographic images ordered to evaluate for the above acute complaints and reviewed radiology reports and findings.  These findings were personally discussed with the patient.  Please see medical record for radiology report.  ____________________________________________   PROCEDURES  Procedure(s) performed:  Procedures    Critical Care performed: no ____________________________________________   INITIAL IMPRESSION / ASSESSMENT AND PLAN / ED COURSE  Pertinent labs & imaging results that were available during my care of the patient were reviewed by me and considered in my medical decision making (see chart for details).  DDX: seizure, med noncompliance, electrolyte abnormality  Keith Chung. is a 36 y.o. who presents to the ED with seizure disorder presents after unwitnessed fall and found postictal was described above. Patient with no focal deficits at this  time but does appears still postictal. No shaking movements to suggest that status. We'll check blood work. We'll CT image his head due to concern for head injury.  The patient will be placed on continuous pulse oximetry and telemetry for monitoring.  Laboratory evaluation will be sent to evaluate for the above complaints.       ----------------------------------------- 1:41 PM on 03/22/2017 -----------------------------------------  Patient has been observed in the ER with improvement in symptoms. States he still has some numbness in his feet but this is continued to improve. At similar symptoms several months ago and was admitted for persistent Todd's paralysis that resolved after observation. This pain I do feel the patient will be stable for follow-up with his primary neurologist. Blood work is reassuring.  Have discussed with the patient and available family all diagnostics and treatments performed thus far and all questions were answered to the best of my ability. The patient demonstrates understanding and agreement with plan.   ____________________________________________   FINAL CLINICAL IMPRESSION(S) / ED DIAGNOSES  Final diagnoses:  Seizure-like activity (HCC)  Todd's paralysis (HCC)  NEW MEDICATIONS STARTED DURING THIS VISIT:  New Prescriptions   No medications on file     Note:  This document was prepared using Dragon voice recognition software and may include unintentional dictation errors.    Willy Eddy, MD 03/22/17 1344

## 2017-03-24 ENCOUNTER — Encounter: Payer: Self-pay | Admitting: Emergency Medicine

## 2017-03-24 ENCOUNTER — Emergency Department
Admission: EM | Admit: 2017-03-24 | Discharge: 2017-03-24 | Disposition: A | Discharge status: Home / Self Care | Payer: Medicaid Other | Attending: Emergency Medicine | Admitting: Emergency Medicine

## 2017-03-24 DIAGNOSIS — F819 Developmental disorder of scholastic skills, unspecified: Secondary | ICD-10-CM | POA: Insufficient documentation

## 2017-03-24 DIAGNOSIS — Z791 Long term (current) use of non-steroidal anti-inflammatories (NSAID): Secondary | ICD-10-CM | POA: Diagnosis not present

## 2017-03-24 DIAGNOSIS — R569 Unspecified convulsions: Secondary | ICD-10-CM | POA: Insufficient documentation

## 2017-03-24 NOTE — ED Provider Notes (Signed)
[facility] Emergency Department Provider Note  ____________________________________________  Time seen: Approximately 11:10 AM  I have reviewed the triage vital signs and the nursing notes.   HISTORY  Chief Complaint Seizures   HPI Keith Chung. is a 36 y.o. male with a history of seizure disorder on Lamictal, Depakote, and Keppra who presents for evaluation of 2 seizures this morning. Patient endorses compliance with his seizure medications. He reports that he received a phone call this morning from his father saying that his cousin killed himself yesterday evening. He became very upset and stressed and had 2 seizures after that. He reports that every time that he gets like this he usually has seizures. Patient was here 2 days ago with completely unremarkable laboratory work. He is followed by Dr. Malvin Johns and has an appointment coming up this week. He does not remember the last time his medications were adjusted or when the levels were checked. He denies any trauma during these episodes today as he was sitting down.No urinary or bowel loss, no tongue trauma. Patient denies headache, cough, congestion, fever, chills, nausea, vomiting, diarrhea, chest pain or shortness of breath. Patient denies alcohol use or drug use.  Past Medical History:  Diagnosis Date  . Learning disorder   . Seasonal allergies   . Seizures [facility])     Patient Active Problem List   Diagnosis Date Noted  . Right sided weakness 10/23/2016  . Subtherapeutic phenytoin level 10/23/2016  . Hypokalemia 10/23/2016  . Cannabis abuse 10/23/2016  . Toxic metabolic encephalopathy 09/26/2016  . Leukopenia 09/26/2016  . Seizure disorder (HCC) 09/26/2016  . Toxic encephalopathy 09/26/2016  . 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 Seizure (HCC) 04/18/2016  . Epilepsy (HCC) 10/17/2015    Past Surgical History:  Procedure Laterality Date  . HAND SURGERY     right hand surgery  . MULTIPLE  TOOTH EXTRACTIONS  02/19/2017  . VASECTOMY Bilateral     Prior to Admission medications   Medication Sig Start Date End Date Taking? Authorizing Provider  divalproex (DEPAKOTE) 500 MG DR tablet Take 1 tablet (500 mg total) by mouth 2 (two) times daily. 10/23/16   Katharina Caper, MD  ibuprofen (ADVIL,MOTRIN) 600 MG tablet Take 600 mg by mouth every 6 (six) hours as needed.    [provider]  lamoTRIgine (LAMICTAL) 100 MG tablet Take 100 mg by mouth 2 (two) times daily.     [provider]  lamoTRIgine (LAMICTAL) 25 MG tablet Take 25 mg by mouth 2 (two) times daily.     [provider]  levETIRAcetam (KEPPRA) 750 MG tablet Take 1 tablet (750 mg total) by mouth 2 (two) times daily. 10/23/16   Katharina Caper, MD  naproxen sodium (ANAPROX) 220 MG tablet Take 220 mg by mouth 2 (two) times daily with a meal.    [provider]    Allergies Pollen extract  Family History  Problem Relation Age of Onset  . Cancer Father        Unsure of type  . Hypertension Father   . Hypertension Mother   . Diabetes Mellitus II Maternal Grandmother     Social History Social History  Substance Use Topics  . Smoking status: Former Smoker    Packs/day: 0.30    Types: Cigarettes    Quit date: 07/18/2014  . Smokeless tobacco: Never Used     Comment: stopped 4 months ago  . Alcohol use No     Comment: socially  Review of Systems  Constitutional: Negative for fever. Eyes: Negative for visual changes. ENT: Negative for sore throat. Neck: No neck pain  Cardiovascular: Negative for chest pain. Respiratory: Negative for shortness of breath. Gastrointestinal: Negative for abdominal pain, vomiting or diarrhea. Genitourinary: Negative for dysuria. Musculoskeletal: Negative for back pain. Skin: Negative for rash. Neurological: Negative for headaches, weakness or numbness. + seizure Psych: No SI or HI  ____________________________________________   PHYSICAL  EXAM:  VITAL SIGNS: ED Triage Vitals  Enc Vitals Group     BP 03/24/17 1042 (!) 137/93     Pulse Rate 03/24/17 1042 72     Resp 03/24/17 1042 18     Temp 03/24/17 1042 97.9 F (36.6 C)     Temp Source 03/24/17 1042 Oral     SpO2 03/24/17 1042 98 %     Weight 03/24/17 1039 152 lb 9.6 oz (69.2 kg)     Height 03/24/17 1039 6\' 2"  (1.88 m)     Head Circumference --      Peak Flow --      Pain Score 03/24/17 1038 0     Pain Loc --      Pain Edu? --      Excl. in GC? --     Constitutional: Alert and oriented. Well appearing and in no apparent distress. HEENT:      Head: Normocephalic and atraumatic.         Eyes: Conjunctivae are normal. Sclera is non-icteric.       Mouth/Throat: Mucous membranes are moist.       Neck: Supple with no signs of meningismus. Cardiovascular: Regular rate and rhythm. No murmurs, gallops, or rubs. 2+ symmetrical distal pulses are present in all extremities. No JVD. Respiratory: Normal respiratory effort. Lungs are clear to auscultation bilaterally. No wheezes, crackles, or rhonchi.  Gastrointestinal: Soft, non tender, and non distended with positive bowel sounds. No rebound or guarding. Genitourinary: No CVA tenderness. Musculoskeletal: Nontender with normal range of motion in all extremities. No edema, cyanosis, or erythema of extremities. Neurologic: Normal speech and language. .A & O x3, PERRL, no nystagmus, CN II-XII intact, motor testing reveals good tone and bulk throughout. There is no evidence of pronator drift or dysmetria. Muscle strength is 5/5 throughout. Sensory examination is intact. Gait is normal. Skin: Skin is warm, dry and intact. No rash noted. Psychiatric: Mood and affect are normal. Speech and behavior are normal.  ____________________________________________   LABS (all labs ordered are listed, but only abnormal results are displayed)  Labs Reviewed - No data to display ____________________________________________  EKG  ED ECG  REPORT I, Nita Sicklearolina Robertson Colclough, the attending physician, personally viewed and interpreted this ECG.  Normal sinus rhythm, rate of 72, normal intervals, normal axis, STE consistent with benign early repol, no ST depressions. ____________________________________________  RADIOLOGY  none  ____________________________________________   PROCEDURES  Procedure(s) performed: None Procedures Critical Care performed:  None ____________________________________________   INITIAL IMPRESSION / ASSESSMENT AND PLAN / ED COURSE  36 y.o. male with a history of seizure disorder on Lamictal, Depakote, and Keppra who presents for evaluation of 2 seizures this morning. Patient is back to his normal self, is neurologically intact, he has normal vital signs. Patient is refusing any blood work. He was here 2 days ago with normal blood work. I offered to check the level of his antiepileptics however patient tells me that he usually gets seizures when he gets really upset and continues to refuse blood work. We'll continue to monitor  patient and to his family is here and at that time we'll discharge him home to the care of his family and close follow-up with Dr. Malvin Johns on his already scheduled appointment this week.  Clinical Course as of Mar 24 1140  Sun Mar 24, 2017  1139 Patient remained stable, monitored in the emergency department for an hour with no recurrence of his seizures. His mother is here and also declined blood work at this time. He has an appointment with his neurologist this week and is to be discharged home at this time to the care of his mother.  [CV]    Clinical Course User Index [CV] Nita Sickle, MD    Pertinent labs & imaging results that were available during my care of the patient were reviewed by me and considered in my medical decision making (see chart for details).    ____________________________________________   FINAL CLINICAL IMPRESSION(S) / ED DIAGNOSES  Final  diagnoses:  Seizure (HCC)      NEW MEDICATIONS STARTED DURING THIS VISIT:  New Prescriptions   No medications on file     Note:  This document was prepared using Dragon voice recognition software and may include unintentional dictation errors.    Don Perking, [place], MD 03/24/17 [phone]

## 2017-03-24 NOTE — ED Triage Notes (Signed)
Patient presents to the ED via EMS from home.  Family told EMS that patient had 2 seizures this morning after he was told that his family committed suicide.  Patient is alert and oriented at this time.  Patient is refusing an IV and blood work.

## 2017-03-25 ENCOUNTER — Emergency Department
Admission: EM | Admit: 2017-03-25 | Discharge: 2017-03-26 | Disposition: A | Discharge status: Home / Self Care | Payer: Medicaid Other | Attending: Emergency Medicine | Admitting: Emergency Medicine

## 2017-03-25 ENCOUNTER — Encounter: Payer: Self-pay | Admitting: Emergency Medicine

## 2017-03-25 DIAGNOSIS — F329 Major depressive disorder, single episode, unspecified: Secondary | ICD-10-CM | POA: Diagnosis not present

## 2017-03-25 DIAGNOSIS — Z87891 Personal history of nicotine dependence: Secondary | ICD-10-CM | POA: Insufficient documentation

## 2017-03-25 DIAGNOSIS — F1210 Cannabis abuse, uncomplicated: Secondary | ICD-10-CM | POA: Insufficient documentation

## 2017-03-25 DIAGNOSIS — G40909 Epilepsy, unspecified, not intractable, without status epilepticus: Secondary | ICD-10-CM | POA: Diagnosis not present

## 2017-03-25 DIAGNOSIS — F1290 Cannabis use, unspecified, uncomplicated: Secondary | ICD-10-CM

## 2017-03-25 DIAGNOSIS — Z046 Encounter for general psychiatric examination, requested by authority: Secondary | ICD-10-CM | POA: Diagnosis present

## 2017-03-25 DIAGNOSIS — F32A Depression, unspecified: Secondary | ICD-10-CM

## 2017-03-25 LAB — URINE DRUG SCREEN, QUALITATIVE (ARMC ONLY)
Amphetamines, Ur Screen: NOT DETECTED
BARBITURATES, UR SCREEN: NOT DETECTED
BENZODIAZEPINE, UR SCRN: NOT DETECTED
Cannabinoid 50 Ng, Ur ~~LOC~~: POSITIVE — AB
Cocaine Metabolite,Ur ~~LOC~~: NOT DETECTED
MDMA (Ecstasy)Ur Screen: NOT DETECTED
Methadone Scn, Ur: NOT DETECTED
OPIATE, UR SCREEN: NOT DETECTED
PHENCYCLIDINE (PCP) UR S: NOT DETECTED
Tricyclic, Ur Screen: NOT DETECTED

## 2017-03-25 LAB — ETHANOL: Alcohol, Ethyl (B): <5 mg/dL

## 2017-03-25 LAB — COMPREHENSIVE METABOLIC PANEL
ALBUMIN: 5.3 g/dL — AB (ref 3.5–5.0)
ALK PHOS: 51 U/L (ref 38–126)
ALT: 12 U/L — ABNORMAL LOW (ref 17–63)
AST: 17 U/L (ref 15–41)
Anion gap: 10 (ref 5–15)
BILIRUBIN TOTAL: 0.8 mg/dL (ref 0.3–1.2)
BUN: 11 mg/dL (ref 6–20)
CALCIUM: 10 mg/dL (ref 8.9–10.3)
CO2: 29 mmol/L (ref 22–32)
Chloride: 100 mmol/L — ABNORMAL LOW (ref 101–111)
Creatinine, Ser: 1.11 mg/dL (ref 0.61–1.24)
GFR calc Af Amer: >60 mL/min (ref >60)
GLUCOSE: 95 mg/dL (ref 65–99)
Potassium: 4.3 mmol/L (ref 3.5–5.1)
Sodium: 139 mmol/L (ref 135–145)
TOTAL PROTEIN: 8.5 g/dL — AB (ref 6.5–8.1)

## 2017-03-25 LAB — SALICYLATE LEVEL: Salicylate Lvl: <7 mg/dL (ref 2.8–30.0)

## 2017-03-25 LAB — CBC
HEMATOCRIT: 45.1 % (ref 40.0–52.0)
Hemoglobin: 15.3 g/dL (ref 13.0–18.0)
MCH: 29.1 pg (ref 26.0–34.0)
MCHC: 34 g/dL (ref 32.0–36.0)
MCV: 85.8 fL (ref 80.0–100.0)
PLATELETS: 300 10*3/uL (ref 150–440)
RBC: 5.26 MIL/uL (ref 4.40–5.90)
RDW: 12.4 % (ref 11.5–14.5)
WBC: 4.6 10*3/uL (ref 3.8–10.6)

## 2017-03-25 LAB — LAMOTRIGINE LEVEL: Lamotrigine Lvl: 10.1 ug/mL (ref 2.0–20.0)

## 2017-03-25 LAB — ACETAMINOPHEN LEVEL: Acetaminophen (Tylenol), Serum: <10 ug/mL — ABNORMAL LOW (ref 10–30)

## 2017-03-25 NOTE — BH Assessment (Signed)
Assessment Note  Keith Bottomrnest C Champoux Jr. is an 36 y.o. male. Keith Chung reports that his cousin committed suicide over the weekend.  He states that he went for a walk with his grand mother.  He states that someone states that he got out of a moving car.  He denied that and stated "the car was at a full stop".  He states that he received a call from his father and officers.  He told the officer where he was, and he informed him.  He informed the officer that he had a pocket knife.   He states the he was taken for an evaluation. [facility] was [facility].  He states that he is upset and he wants to know why his cousin killed himself.   He stated that "we will never know".  He shared that he has no intent to harm himself.  He denied symptoms of depression. "I am no where, where I am going to cause bodily harm to myself or no one else", "I just want to spend time with my family". He denied symptoms of anxiety.  He denied having auditory or visual hallucinations.  He denied suicidal or homicidal ideation or intent.  He reports that he has a diagnosis of epilepsy, and he had seizures yesterday.  Keith Chung's IVC paperwork reports "Jumped out of moving car with pocket knife, making threats to harm self, family member committed suicide recently told police to leave him the fuck alone"    Diagnosis:   Past Medical History:  Past Medical History:  Diagnosis Date  . Learning disorder   . Seasonal allergies   . Seizures (HCC)     Past Surgical History:  Procedure Laterality Date  . HAND SURGERY     right hand surgery  . MULTIPLE TOOTH EXTRACTIONS  02/19/2017  . VASECTOMY Bilateral     Family History:  Family History  Problem Relation Age of Onset  . Cancer Father        Unsure of type  . Hypertension Father   . Hypertension Mother   . Diabetes Mellitus II Maternal Grandmother     Social History:  reports that he quit smoking about 2 years ago. His smoking use included Cigarettes. He smoked  0.30 packs per day. He has never used smokeless tobacco. He reports that he does not drink alcohol or use drugs.  Additional Social History:  Alcohol / Drug Use History of alcohol / drug use?: No history of alcohol / drug abuse  CIWA:   COWS:    Allergies:  Allergies  Allergen Reactions  . Pollen Extract Other (See Comments)    Runny nose, watery eyes, sneezing    Home Medications:  (Not in a hospital admission)  OB/GYN Status:  No LMP for male patient.  General Assessment Data [facility] Assessment: In system Is this a Tele or Face-to-Face Assessment?: Face-to-Face Is this an Initial Assessment or a Re-assessment for this encounter?: Initial Assessment Marital status: Single Maiden name: n/a Is patient pregnant?: No Pregnancy Status: No Living Arrangements: Parent Can pt return to current living arrangement?: Yes Admission Status: Involuntary Is patient capable of signing voluntary admission?: Yes Referral Source: Self/Family/Friend Insurance type: Medicaid  Medical Screening Exam [facility] Walk-in ONLY) Medical Exam completed: Yes  Crisis Care Plan Living Arrangements: Parent Legal Guardian: Other: (Self) Name of Psychiatrist: None Name of Therapist: None  Education Status Is patient currently in school?: No Current Grade: n/a Highest grade of  school patient has completed: GED Name of school: [employer]  Contact person: n/a  Risk to self with the past 6 months Suicidal Ideation: No Has patient been a risk to self within the past 6 months prior to admission? : No Suicidal Intent: No Has patient had any suicidal intent within the past 6 months prior to admission? : No Is patient at risk for suicide?: No Suicidal Plan?: No Has patient had any suicidal plan within the past 6 months prior to admission? : No Access to Means: No What has been your use of drugs/alcohol within the last 12 months?: Denied use Previous Attempts/Gestures: No How many  times?: 0 Other Self Harm Risks: Denied by patient Triggers for Past Attempts: None known Intentional Self Injurious Behavior: None Family Suicide History: Yes (Cousin committed suicide yesterday (03/25/2017)) Recent stressful life event(s): Loss (Comment) (death of cousin by suicide) Persecutory voices/beliefs?: No Depression: No Depression Symptoms:  (Denied by patient) Substance abuse history and/or treatment for substance abuse?: No Suicide prevention information given to non-admitted patients: Not applicable  Risk to Others within the past 6 months Homicidal Ideation: No Does patient have any lifetime risk of violence toward others beyond the six months prior to admission? : No Thoughts of Harm to Others: No Current Homicidal Intent: No Current Homicidal Plan: No Access to Homicidal Means: No Identified Victim: None identified History of harm to others?: No Assessment of Violence: None Noted Violent Behavior Description: Denied Does patient have access to weapons?: No Criminal Charges Pending?: No Does patient have a court date: No Is patient on probation?: No  Psychosis Hallucinations: None noted Delusions: None noted  Mental Status Report Appearance/Hygiene: In scrubs Eye Contact: Good Motor Activity: Unremarkable Speech: Logical/coherent Level of Consciousness: Alert Mood: Euthymic Affect: Appropriate to circumstance Anxiety Level: None Thought Processes: Coherent Judgement: Unimpaired Orientation: Person, Place, Time, Situation Obsessive Compulsive Thoughts/Behaviors: None  Cognitive Functioning Concentration: Normal Memory: Recent Intact IQ: Average Insight: Fair Impulse Control: Good Appetite: Good Sleep: No Change Vegetative Symptoms: None  ADLScreening [facility] Assessment Services) Patient's cognitive ability adequate to safely complete daily activities?: Yes Patient able to express need for assistance with ADLs?: Yes Independently performs ADLs?: Yes  (appropriate for developmental age)  Prior Inpatient Therapy Prior Inpatient Therapy: No Prior Therapy Dates: n/a Prior Therapy Facilty/Provider(s): n/a Reason for Treatment: n/a  Prior Outpatient Therapy Prior Outpatient Therapy: No Prior Therapy Dates: n/a Prior Therapy Facilty/Provider(s): n/a Reason for Treatment: n/a Does patient have an ACCT team?: No Does patient have Intensive In-House Services?  : No Does patient have [facility] services? : No Does patient have P4CC services?: No  ADL Screening (condition at time of admission) Patient's cognitive ability adequate to safely complete daily activities?: Yes Is the patient deaf or have difficulty hearing?: No Does the patient have difficulty seeing, even when wearing glasses/contacts?: No Does the patient have difficulty concentrating, remembering, or making decisions?: No Patient able to express need for assistance with ADLs?: Yes Does the patient have difficulty dressing or bathing?: No Independently performs ADLs?: Yes (appropriate for developmental age) Does the patient have difficulty walking or climbing stairs?: No Weakness of Legs: None Weakness of Arms/Hands: None  Home Assistive Devices/Equipment Home Assistive Devices/Equipment: Eyeglasses    Abuse/Neglect Assessment (Assessment to be complete while patient is alone) Physical Abuse: Denies Verbal Abuse: Denies Sexual Abuse: Denies Exploitation of patient/patient's resources: Denies Self-Neglect: Denies     Merchant [employer] officer (For Healthcare) Does Patient Have a Medical Advance Directive?: No Would patient like information on  creating a medical advance directive?: No - Patient declined    Additional Information 1:1 In Past 12 Months?: No CIRT Risk: No Elopement Risk: No Does patient have medical clearance?: Yes     Disposition:  Disposition Initial Assessment Completed for this Encounter: Yes Disposition of Patient: Other dispositions  On Site  Evaluation by:   Reviewed with Physician:    Justice Deeds 03/25/2017 7:53 PM

## 2017-03-25 NOTE — ED Triage Notes (Signed)
[facility] [place] [facility].    Patient states "I have had a death in the family, and my dad thinks I'm going to hurt myself".  Patietn states "I just went for a walk and my dad kept calling me and then the police came"  Patient states he told his father hi exact location when he was waling.  Denies SI/ HI.  IVC paperwork states "Jumped out of moving car with pocket knife making threats to harm self.  Family recently committed suicide.  Told Police to leave him the fuck alone".

## 2017-03-25 NOTE — ED Provider Notes (Addendum)
[facility] Emergency Department Provider Note  ____________________________________________  Time seen: Approximately 5:30 PM  I have reviewed the triage vital signs and the nursing notes.   HISTORY  Chief Complaint Medical Clearance    HPI Keith Bottomrnest C Woodberry Jr. is a 36 y.o. male with a history of seizure disorder, no history of psychiatric illness, brought by police for suicidal ideations. The police report states that the patient jumped out of a moving vehicle and had a pocket knife, and was swearing at them. The patient reports that his cousin recently died by suicide but that he has not been having any thoughts of harming himself or anyone else, and has not been having any hallucinations. The patient has no medical complaints at this time and did take all of his seizure medications this morning.   Past Medical History:  Diagnosis Date  . Learning disorder   . Seasonal allergies   . Seizures [facility](HCC)     Patient Active Problem List   Diagnosis Date Noted  . Right sided weakness 10/23/2016  . Subtherapeutic phenytoin level 10/23/2016  . Hypokalemia 10/23/2016  . Cannabis abuse 10/23/2016  . Toxic metabolic encephalopathy 09/26/2016  . Leukopenia 09/26/2016  . Seizure disorder (HCC) 09/26/2016  . Toxic encephalopathy 09/26/2016  . 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 Seizure (HCC) 04/18/2016  . Epilepsy (HCC) 10/17/2015    Past Surgical History:  Procedure Laterality Date  . HAND SURGERY     right hand surgery  . MULTIPLE TOOTH EXTRACTIONS  02/19/2017  . VASECTOMY Bilateral     Current Outpatient Rx  . Order #: [id number] Class: Normal  . Order #: [id number] Class: Historical Med  . Order #: [id number] Class: Historical Med  . Order #: [id number] Class: Historical Med  . Order #: [id number] Class: Normal  . Order #: [id number] Class: Historical Med    Allergies Pollen extract  Family History  Problem Relation Age of Onset  . Cancer Father       Unsure of type  . Hypertension Father   . Hypertension Mother   . Diabetes Mellitus II Maternal Grandmother     Social History Social History  Substance Use Topics  . Smoking status: Former Smoker    Packs/day: 0.30    Types: Cigarettes    Quit date: 07/18/2014  . Smokeless tobacco: Never Used     Comment: stopped 4 months ago  . Alcohol use No     Comment: socially    Review of Systems Constitutional: No fever/chills.No lightheadedness or syncope. Eyes: No visual changes. ENT: No sore throat. No congestion or rhinorrhea. Cardiovascular: Denies chest pain. Denies palpitations. Respiratory: Denies shortness of breath.  No cough. Gastrointestinal: No abdominal pain.  No nausea, no vomiting.  No diarrhea.  No constipation. Genitourinary: Negative for dysuria. Musculoskeletal: Negative for back pain. Skin: Negative for rash. Neurological: Negative for headaches. No focal numbness, tingling or weakness. Denies recent seizures. Psychiatric:Denies overwhelming sadness, denies SI, HI or hallucinations.    ____________________________________________   PHYSICAL EXAM:  VITAL SIGNS: ED Triage Vitals [03/25/17 1649]  Enc Vitals Group     BP      Pulse      Resp      Temp      Temp src      SpO2      Weight 152 lb (68.9 kg)     Height 6\' 2"  (1.88 m)     Head Circumference      Peak Flow      Pain Score  Pain Loc      Pain Edu?      Excl. in GC?     Constitutional: Alert and oriented. Well appearing and in no acute distress. Answers questions appropriately. Eyes: Conjunctivae are normal.  EOMI. No scleral icterus. Head: Atraumatic. Nose: No congestion/rhinnorhea. Mouth/Throat: Mucous membranes are moist.  Neck: No stridor.  Supple.   Cardiovascular: Normal rate, regular rhythm. No murmurs, rubs or gallops.  Respiratory: Normal respiratory effort.  No accessory muscle use or retractions. Lungs CTAB.  No wheezes, rales or ronchi. Gastrointestinal: Soft,  nontender and nondistended.  No guarding or rebound.  No peritoneal signs. Musculoskeletal: No LE edema.  Neurologic:  A&Ox3.  Speech is clear.  Face and smile are symmetric.  EOMI.  Moves all extremities well. Skin:  Skin is warm, dry and intact. No rash noted. Psychiatric: The patient is mildly anxious with mildly pressured speech, but has good insight into why he is here. On my examination, the patient denies SI, HI or hallucinations.  ____________________________________________   LABS (all labs ordered are listed, but only abnormal results are displayed)  Labs Reviewed  COMPREHENSIVE METABOLIC PANEL - Abnormal; Notable for the following:       Result Value   Chloride 100 (*)    Total Protein 8.5 (*)    Albumin 5.3 (*)    ALT 12 (*)    All other components within normal limits  ACETAMINOPHEN LEVEL - Abnormal; Notable for the following:    Acetaminophen (Tylenol), Serum <10 (*)    All other components within normal limits  URINE DRUG SCREEN, QUALITATIVE ([facility] ONLY) - Abnormal; Notable for the following:    Cannabinoid 50 Ng, Ur [place] POSITIVE (*)    All other components within normal limits  ETHANOL  SALICYLATE LEVEL  CBC   ____________________________________________  EKG  Not indicated ____________________________________________  RADIOLOGY  No results found.  ____________________________________________   PROCEDURES  Procedure(s) performed: None  Procedures  Critical Care performed: No ____________________________________________   INITIAL IMPRESSION / ASSESSMENT AND PLAN / ED COURSE  Pertinent labs & imaging results that were available during my care of the patient were reviewed by me and considered in my medical decision making (see chart for details).  36 y.o. male with a history of seizures presenting for suicidal ideations. At this time, the patient is denying suicidality or any other red flag psychiatric illness. He is feeling appropriately sad but  denies any overwhelming sadness due to his cousin's recent death. He is most concerned about "why did he do this?" The patient is hemodynamically stable and has no acute medical conditions at this time. His laboratory studies are reassuring. At this time, the patient is medically cleared for psychiatric disposition. IVC paperwork was completed at our [facility] and involuntary commitment will be continued here.    ____________________________________________  FINAL CLINICAL IMPRESSION(S) / ED DIAGNOSES  Final diagnoses:  Depression, unspecified depression type  Marijuana use         NEW MEDICATIONS STARTED DURING THIS VISIT:  New Prescriptions   No medications on file      Rockne Menghini, MD 03/25/17 1734    Rockne Menghini, MD 03/25/17 2137

## 2017-03-25 NOTE — ED Notes (Signed)
Pt given supper box

## 2017-03-26 NOTE — Discharge Instructions (Signed)
Return to the ER for worsening symptoms, feelings of hurting yourself or others, or other concerns. 

## 2017-03-26 NOTE — ED Provider Notes (Signed)
-----------------------------------------   4:27 AM on 03/26/2017 -----------------------------------------  [facility] Dr[facility] Hermelinda MedicusAlvarado who deems patiently psychiatrically stable for discharge and rescinded his IVC. Recommends outpatient follow-up with mental health clinic. Strict return precautions given. Patient verbalizes understanding and agrees with plan of care.   Irean HongSung, Jade J, MD 03/26/17 [phone]

## 2017-03-26 NOTE — ED Notes (Signed)
Telepsych for SOC set up in pt room. 

## 2017-03-26 NOTE — ED Notes (Signed)
Pt denies IS/HI at this time.  

## 2017-05-12 ENCOUNTER — Encounter: Payer: Self-pay | Admitting: Emergency Medicine

## 2017-05-12 ENCOUNTER — Emergency Department
Admission: EM | Admit: 2017-05-12 | Discharge: 2017-05-12 | Disposition: A | Discharge status: Home / Self Care | Payer: Medicaid Other | Attending: Emergency Medicine | Admitting: Emergency Medicine

## 2017-05-12 DIAGNOSIS — Z79899 Other long term (current) drug therapy: Secondary | ICD-10-CM | POA: Insufficient documentation

## 2017-05-12 DIAGNOSIS — Y92003 Bedroom of unspecified non-institutional (private) residence as the place of occurrence of the external cause: Secondary | ICD-10-CM | POA: Insufficient documentation

## 2017-05-12 DIAGNOSIS — W228XXA Striking against or struck by other objects, initial encounter: Secondary | ICD-10-CM | POA: Insufficient documentation

## 2017-05-12 DIAGNOSIS — R569 Unspecified convulsions: Secondary | ICD-10-CM

## 2017-05-12 DIAGNOSIS — Z87891 Personal history of nicotine dependence: Secondary | ICD-10-CM | POA: Insufficient documentation

## 2017-05-12 DIAGNOSIS — W1830XA Fall on same level, unspecified, initial encounter: Secondary | ICD-10-CM | POA: Insufficient documentation

## 2017-05-12 DIAGNOSIS — Y939 Activity, unspecified: Secondary | ICD-10-CM | POA: Diagnosis not present

## 2017-05-12 DIAGNOSIS — Y999 Unspecified external cause status: Secondary | ICD-10-CM | POA: Insufficient documentation

## 2017-05-12 DIAGNOSIS — S01112A Laceration without foreign body of left eyelid and periocular area, initial encounter: Secondary | ICD-10-CM | POA: Insufficient documentation

## 2017-05-12 LAB — CBC
HCT: 43.9 % (ref 40.0–52.0)
Hemoglobin: 15 g/dL (ref 13.0–18.0)
MCH: 29.8 pg (ref 26.0–34.0)
MCHC: 34.2 g/dL (ref 32.0–36.0)
MCV: 87.1 fL (ref 80.0–100.0)
Platelets: 279 10*3/uL (ref 150–440)
RBC: 5.04 MIL/uL (ref 4.40–5.90)
RDW: 12.5 % (ref 11.5–14.5)
WBC: 6.6 10*3/uL (ref 3.8–10.6)

## 2017-05-12 LAB — URINE DRUG SCREEN, QUALITATIVE (ARMC ONLY)
Amphetamines, Ur Screen: NOT DETECTED
BARBITURATES, UR SCREEN: NOT DETECTED
BENZODIAZEPINE, UR SCRN: NOT DETECTED
CANNABINOID 50 NG, UR ~~LOC~~: POSITIVE — AB
Cocaine Metabolite,Ur ~~LOC~~: NOT DETECTED
MDMA (Ecstasy)Ur Screen: NOT DETECTED
Methadone Scn, Ur: NOT DETECTED
OPIATE, UR SCREEN: NOT DETECTED
PHENCYCLIDINE (PCP) UR S: NOT DETECTED
Tricyclic, Ur Screen: NOT DETECTED

## 2017-05-12 LAB — URINALYSIS, COMPLETE (UACMP) WITH MICROSCOPIC
BILIRUBIN URINE: NEGATIVE
Bacteria, UA: NONE SEEN
GLUCOSE, UA: NEGATIVE mg/dL
HGB URINE DIPSTICK: NEGATIVE
KETONES UR: NEGATIVE mg/dL
Leukocytes, UA: NEGATIVE
NITRITE: NEGATIVE
PH: 5 (ref 5.0–8.0)
Protein, ur: 30 mg/dL — AB
Specific Gravity, Urine: 1.02 (ref 1.005–1.030)
Squamous Epithelial / LPF: NONE SEEN

## 2017-05-12 LAB — BASIC METABOLIC PANEL
Anion gap: 14 (ref 5–15)
BUN: 10 mg/dL (ref 6–20)
CALCIUM: 9.3 mg/dL (ref 8.9–10.3)
CO2: 20 mmol/L — AB (ref 22–32)
Chloride: 106 mmol/L (ref 101–111)
Creatinine, Ser: 1.36 mg/dL — ABNORMAL HIGH (ref 0.61–1.24)
GFR calc Af Amer: >60 mL/min (ref >60)
GLUCOSE: 105 mg/dL — AB (ref 65–99)
POTASSIUM: 4 mmol/L (ref 3.5–5.1)
Sodium: 140 mmol/L (ref 135–145)

## 2017-05-12 LAB — VALPROIC ACID LEVEL: Valproic Acid Lvl: <10 ug/mL — ABNORMAL LOW (ref 50.0–100.0)

## 2017-05-12 NOTE — ED Notes (Signed)
Seizure pads in place, suction available

## 2017-05-12 NOTE — ED Triage Notes (Signed)
Pt to rm 2 via EMS from home, report seizure, was wear glasses and laceration to left eye lid from glasses, about 1" in length, bleeding controlled at this time.  PT takes keppra, lamictal, and depakote.

## 2017-05-12 NOTE — ED Notes (Signed)
Pt ambulatory to bathroom w/ EDT at standby

## 2017-05-12 NOTE — ED Notes (Addendum)
MD at bedside to repair laceration. Pt is alert and oriented. Seizure pads in place. No needs at this time.

## 2017-05-12 NOTE — ED Provider Notes (Addendum)
[facility] Emergency Department Provider Note  ____________________________________________   First MD Initiated Contact with Patient 05/12/17 [phone]     (approximate)  I have reviewed the triage vital signs and the nursing notes.   HISTORY  Chief Complaint Seizures   HPI Keith Chung. is a 36 y.o. male with a history of seizure disorder who had a seizure this morning. He says he had woken up and was at the side of his bed when he fell forward and seized for about 5-8 minutes. The episode was witnessed by his fiance. The patient did not lose bowel or bladder continence but did sustain a laceration over the left eyelid. He admits noncompliance with his medications over the past week missing 2 total days of medication. He denies any complaints at this time. Denies any headache or even pain over the laceration. Says that his last tetanus shot was within the last year.   Past Medical History:  Diagnosis Date  . Learning disorder   . Seasonal allergies   . Seizures [facility])     Patient Active Problem List   Diagnosis Date Noted  . Right sided weakness 10/23/2016  . Subtherapeutic phenytoin level 10/23/2016  . Hypokalemia 10/23/2016  . Cannabis abuse 10/23/2016  . Toxic metabolic encephalopathy 09/26/2016  . Leukopenia 09/26/2016  . Seizure disorder (HCC) 09/26/2016  . Toxic encephalopathy 09/26/2016  . 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 Seizure (HCC) 04/18/2016  . Epilepsy (HCC) 10/17/2015    Past Surgical History:  Procedure Laterality Date  . HAND SURGERY     right hand surgery  . MULTIPLE TOOTH EXTRACTIONS  02/19/2017  . VASECTOMY Bilateral     Prior to Admission medications   Medication Sig Start Date End Date Taking? Authorizing Provider  divalproex (DEPAKOTE) 500 MG DR tablet Take 1 tablet (500 mg total) by mouth 2 (two) times daily. 10/23/16   Katharina Caper, MD  ibuprofen (ADVIL,MOTRIN) 600 MG tablet Take 600 mg by mouth every 6  (six) hours as needed.    [provider]  lamoTRIgine (LAMICTAL) 100 MG tablet Take 100 mg by mouth 2 (two) times daily.     [provider]  lamoTRIgine (LAMICTAL) 25 MG tablet Take 25 mg by mouth 2 (two) times daily.     [provider]  levETIRAcetam (KEPPRA) 750 MG tablet Take 1 tablet (750 mg total) by mouth 2 (two) times daily. 10/23/16   Katharina Caper, MD  naproxen sodium (ANAPROX) 220 MG tablet Take 220 mg by mouth 2 (two) times daily with a meal.    [provider]    Allergies Pollen extract  Family History  Problem Relation Age of Onset  . Cancer Father        Unsure of type  . Hypertension Father   . Hypertension Mother   . Diabetes Mellitus II Maternal Grandmother     Social History Social History  Substance Use Topics  . Smoking status: Former Smoker    Packs/day: 0.30    Types: Cigarettes    Quit date: 07/18/2014  . Smokeless tobacco: Never Used     Comment: stopped 4 months ago  . Alcohol use No     Comment: socially    Review of Systems  Constitutional: No fever/chills Eyes: No visual changes. ENT: No sore throat. Cardiovascular: Denies chest pain. Respiratory: Denies shortness of breath. Gastrointestinal: No abdominal pain.  No nausea, no vomiting.  No diarrhea.  No constipation. Genitourinary: Negative for dysuria. Musculoskeletal:  Negative for back pain. Skin: Negative for rash. Neurological: Negative for headaches, focal weakness or numbness.   ____________________________________________   PHYSICAL EXAM:  VITAL SIGNS: ED Triage Vitals  Enc Vitals Group     BP 05/12/17 0600 (!) 94/53     Pulse Rate 05/12/17 0600 (!) 58     Resp 05/12/17 0600 14     Temp 05/12/17 0600 98.2 F (36.8 C)     Temp Source 05/12/17 0600 Oral     SpO2 05/12/17 0600 100 %     Weight 05/12/17 0558 175 lb (79.4 kg)     Height 05/12/17 0558 6\' 2"  (1.88 m)     Head Circumference --      Peak Flow --      Pain Score 05/12/17  0558 2     Pain Loc --      Pain Edu? --      Excl. in GC? --     Constitutional: Alert and oriented. Well appearing and in no acute distress. Eyes: Conjunctivae are normal.  Head: 3 centimeter laceration overlying the superior and lateral aspect of the left eyelid. There is no involvement of the canthus. The laceration does not approach the lid margin and is in a horizontal orientation. There is no adipose tissue spilling from the laceration. There is no active bleeding. Nose: No congestion/rhinnorhea. Mouth/Throat: Mucous membranes are moist. Abrasion to the left side of the tongue without any active bleeding. No laceration. Neck: No stridor.   Cardiovascular: Normal rate, regular rhythm. Grossly normal heart sounds.   Respiratory: Normal respiratory effort.  No retractions. Lungs CTAB. Gastrointestinal: Soft and nontender. No distention.  Musculoskeletal: No lower extremity tenderness nor edema.  No joint effusions. Neurologic:  Normal speech and language. No gross focal neurologic deficits are appreciated. Skin:  Skin is warm, dry and intact. No rash noted. Psychiatric: Mood and affect are normal. Speech and behavior are normal.  ____________________________________________   LABS (all labs ordered are listed, but only abnormal results are displayed)  Labs Reviewed  BASIC METABOLIC PANEL - Abnormal; Notable for the following:       Result Value   CO2 20 (*)    Glucose, Bld 105 (*)    Creatinine, Ser 1.36 (*)    All other components within normal limits  URINALYSIS, COMPLETE (UACMP) WITH MICROSCOPIC - Abnormal; Notable for the following:    Color, Urine YELLOW (*)    APPearance CLEAR (*)    Protein, ur 30 (*)    All other components within normal limits  URINE DRUG SCREEN, QUALITATIVE ([facility] ONLY) - Abnormal; Notable for the following:    Cannabinoid 50 Ng, Ur [place] POSITIVE (*)    All other components within normal limits  CBC  VALPROIC ACID LEVEL    ____________________________________________  EKG   ____________________________________________  RADIOLOGY   ____________________________________________   PROCEDURES  Procedure(s) performed:  LACERATION REPAIR Performed by: Arelia Longest Authorized by: Arelia Longest Consent: Verbal consent obtained. Risks and benefits: risks, benefits and alternatives were discussed Consent given by: patient Patient identity confirmed: provided demographic data Prepped and Draped in normal sterile fashion Wound explored  Laceration Location: left eyelid  Laceration Length: 3cm  No Foreign Bodies seen or palpated    Irrigation method: syringe Amount of cleaning: standard  Skin closure: dermabond with steristrips  Patient tolerance: Patient tolerated the procedure well with no immediate complications.   Procedures  Critical Care performed:   ____________________________________________   INITIAL IMPRESSION /  ASSESSMENT AND PLAN / ED COURSE  Pertinent labs & imaging results that were available during my care of the patient were reviewed by me and considered in my medical decision making (see chart for details).  ----------------------------------------- 8:19 AM on 05/12/2017 -----------------------------------------  Patient with labs that are at or near baseline. Valproic acid level is undetectable but this is to be expected as the patient has not taken his medications at least 2 times this week. He has taken his morning medications in the emergency department. He'll be following up Dr. Malvin Johns. He continues to be a symptomatic. He does not drive. He is understanding the planand willing to comply. Will be discharged home.      ____________________________________________   FINAL CLINICAL IMPRESSION(S) / ED DIAGNOSES  Seizure. Eyelid laceration.    NEW MEDICATIONS STARTED DURING THIS VISIT:  New Prescriptions   No medications on file      Note:  This document was prepared using Dragon voice recognition software and may include unintentional dictation errors.     Myrna Blazer, MD 05/12/17 0820  ED ECG REPORT I, Arelia Longest, the attending physician, personally viewed and interpreted this ECG.   Date: 05/12/2017  EKG Time: 6:00  Rate: 8  Rhythm: sinus bradycardia  Axis: normal  Intervals:none  ST&T Change: T-wave inversion in aVL. Mild and diffuse ST elevation consistent with benign early repolarization. No significant change from EKG of 12/12/2016.    Myrna Blazer, MD 05/12/17 [phone]

## 2017-08-10 ENCOUNTER — Other Ambulatory Visit: Payer: Self-pay

## 2017-08-10 ENCOUNTER — Emergency Department
Admission: EM | Admit: 2017-08-10 | Discharge: 2017-08-10 | Disposition: A | Discharge status: Home / Self Care | Payer: Medicaid Other | Attending: Emergency Medicine | Admitting: Emergency Medicine

## 2017-08-10 ENCOUNTER — Encounter: Payer: Self-pay | Admitting: Emergency Medicine

## 2017-08-10 DIAGNOSIS — R4689 Other symptoms and signs involving appearance and behavior: Secondary | ICD-10-CM | POA: Diagnosis present

## 2017-08-10 DIAGNOSIS — Z634 Disappearance and death of family member: Secondary | ICD-10-CM

## 2017-08-10 DIAGNOSIS — F17210 Nicotine dependence, cigarettes, uncomplicated: Secondary | ICD-10-CM | POA: Diagnosis not present

## 2017-08-10 DIAGNOSIS — F1210 Cannabis abuse, uncomplicated: Secondary | ICD-10-CM | POA: Insufficient documentation

## 2017-08-10 DIAGNOSIS — Z79899 Other long term (current) drug therapy: Secondary | ICD-10-CM | POA: Diagnosis not present

## 2017-08-10 LAB — CBC
HCT: 40.4 % (ref 40.0–52.0)
Hemoglobin: 14 g/dL (ref 13.0–18.0)
MCH: 30 pg (ref 26.0–34.0)
MCHC: 34.7 g/dL (ref 32.0–36.0)
MCV: 86.4 fL (ref 80.0–100.0)
PLATELETS: 220 10*3/uL (ref 150–440)
RBC: 4.68 MIL/uL (ref 4.40–5.90)
RDW: 12.7 % (ref 11.5–14.5)
WBC: 3.8 10*3/uL (ref 3.8–10.6)

## 2017-08-10 LAB — COMPREHENSIVE METABOLIC PANEL
ALT: 12 U/L — AB (ref 17–63)
AST: 20 U/L (ref 15–41)
Albumin: 4.7 g/dL (ref 3.5–5.0)
Alkaline Phosphatase: 43 U/L (ref 38–126)
Anion gap: 10 (ref 5–15)
BUN: 14 mg/dL (ref 6–20)
CHLORIDE: 102 mmol/L (ref 101–111)
CO2: 26 mmol/L (ref 22–32)
Calcium: 9.1 mg/dL (ref 8.9–10.3)
Creatinine, Ser: 1.13 mg/dL (ref 0.61–1.24)
Glucose, Bld: 110 mg/dL — ABNORMAL HIGH (ref 65–99)
POTASSIUM: 4.2 mmol/L (ref 3.5–5.1)
SODIUM: 138 mmol/L (ref 135–145)
Total Bilirubin: 0.4 mg/dL (ref 0.3–1.2)
Total Protein: 7.5 g/dL (ref 6.5–8.1)

## 2017-08-10 LAB — URINE DRUG SCREEN, QUALITATIVE (ARMC ONLY)
Amphetamines, Ur Screen: NOT DETECTED
BENZODIAZEPINE, UR SCRN: NOT DETECTED
Barbiturates, Ur Screen: NOT DETECTED
CANNABINOID 50 NG, UR ~~LOC~~: POSITIVE — AB
Cocaine Metabolite,Ur ~~LOC~~: NOT DETECTED
MDMA (Ecstasy)Ur Screen: NOT DETECTED
Methadone Scn, Ur: NOT DETECTED
Opiate, Ur Screen: NOT DETECTED
PHENCYCLIDINE (PCP) UR S: NOT DETECTED
Tricyclic, Ur Screen: NOT DETECTED

## 2017-08-10 LAB — SALICYLATE LEVEL

## 2017-08-10 LAB — ACETAMINOPHEN LEVEL: Acetaminophen (Tylenol), Serum: <10 ug/mL — ABNORMAL LOW (ref 10–30)

## 2017-08-10 LAB — ETHANOL

## 2017-08-10 NOTE — Discharge Instructions (Signed)
You have been seen in the Emergency Department (ED)  today for a psychiatric complaint.  You have been evaluated by psychiatry and we believe you are safe to be discharged from the hospital.   ° °Please return to the Emergency Department (ED)  immediately if you have ANY thoughts of hurting yourself or anyone else, so that we may help you. ° °Please avoid alcohol and drug use. ° °Follow up with your doctor and/or therapist as soon as possible regarding today's ED  visit.  ° °You may call crisis hotline for [place] at [phone]. ° °

## 2017-08-10 NOTE — ED Provider Notes (Signed)
[facility] Emergency Department Provider Note  ____________________________________________  Time seen: Approximately 12:36 AM  I have reviewed the triage vital signs and the nursing notes.   HISTORY  Chief Complaint Aggressive Behavior   HPI Keith Bottomrnest C Sorrels Jr. is a 36 y.o. male with h/o seizures who presents IVC'ed by [employer] for SI. According the [employer] and IVC paper, patient got into an argument with his father this evening and father said patient threatened to commit suicide. Father was worried because according to him , patient owns a gun. Father called police who found patient walking on the side of road, patient resisted when police tried to approach him and held one of his arms behind his back, which made police suspicious that patient had a gun however no gun was found on thew patient. Patient tells me that his cousin committed suicide in July and he was felling sad and missing his cousin during Thanksgiving dinner yesterday. He denies ever making any suicidal remarks and tells me he does not own or has access to a gun. Patient reports that he was sad, had some beers yesterday and got into an argument with his dad but denies SI. He denies any prior h/o SI or attempts. He denies any drugs or alcohol today. He denies h/o depression or any other psych diagnoses.   Past Medical History:  Diagnosis Date  . Learning disorder   . Seasonal allergies   . Seizures [facility](HCC)     Patient Active Problem List   Diagnosis Date Noted  . Right sided weakness 10/23/2016  . Subtherapeutic phenytoin level 10/23/2016  . Hypokalemia 10/23/2016  . Cannabis abuse 10/23/2016  . Toxic metabolic encephalopathy 09/26/2016  . Leukopenia 09/26/2016  . Seizure disorder (HCC) 09/26/2016  . Toxic encephalopathy 09/26/2016  . 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 Seizure (HCC) 04/18/2016  . Epilepsy (HCC) 10/17/2015    Past Surgical History:  Procedure Laterality Date  . HAND  SURGERY     right hand surgery  . MULTIPLE TOOTH EXTRACTIONS  02/19/2017  . VASECTOMY Bilateral     Prior to Admission medications   Medication Sig Start Date End Date Taking? Authorizing Provider  divalproex (DEPAKOTE) 500 MG DR tablet Take 1 tablet (500 mg total) by mouth 2 (two) times daily. 10/23/16   Katharina CaperVaickute, Rima, MD  ibuprofen (ADVIL,MOTRIN) 600 MG tablet Take 600 mg by mouth every 6 (six) hours as needed.    [provider]  lamoTRIgine (LAMICTAL) 100 MG tablet Take 100 mg by mouth 2 (two) times daily.     [provider]  lamoTRIgine (LAMICTAL) 25 MG tablet Take 25 mg by mouth 2 (two) times daily.     [provider]  levETIRAcetam (KEPPRA) 750 MG tablet Take 1 tablet (750 mg total) by mouth 2 (two) times daily. 10/23/16   Katharina CaperVaickute, Rima, MD  naproxen sodium (ANAPROX) 220 MG tablet Take 220 mg by mouth 2 (two) times daily with a meal.    [provider]    Allergies Pollen extract  Family History  Problem Relation Age of Onset  . Cancer Father        Unsure of type  . Hypertension Father   . Hypertension Mother   . Diabetes Mellitus II Maternal Grandmother     Social History Social History   Tobacco Use  . Smoking status: Current Every Day Smoker    Packs/day: 0.30    Types: Cigarettes    Last attempt to quit: 07/18/2014  Years since quitting: 3.0  . Smokeless tobacco: Never Used  . Tobacco comment: stopped 4 months ago  Substance Use Topics  . Alcohol use: Yes    Alcohol/week: 0.0 oz    Comment: socially  . Drug use: Yes    Types: Marijuana    Comment: smoked yesterday    Review of Systems  Constitutional: Negative for fever. Eyes: Negative for visual changes. ENT: Negative for sore throat. Neck: No neck pain  Cardiovascular: Negative for chest pain. Respiratory: Negative for shortness of breath. Gastrointestinal: Negative for abdominal pain, vomiting or diarrhea. Genitourinary: Negative for  dysuria. Musculoskeletal: Negative for back pain. Skin: Negative for rash. Neurological: Negative for headaches, weakness or numbness. Psych: No SI or HI  ____________________________________________   PHYSICAL EXAM:  VITAL SIGNS: ED Triage Vitals  Enc Vitals Group     BP 08/10/17 0025 (!) 146/80     Pulse Rate 08/10/17 0025 80     Resp 08/10/17 0025 18     Temp 08/10/17 0025 98.2 F (36.8 C)     Temp Source 08/10/17 0025 Oral     SpO2 08/10/17 0025 99 %     Weight 08/10/17 0025 165 lb (74.8 kg)     Height 08/10/17 0025 6\' 2"  (1.88 m)     Head Circumference --      Peak Flow --      Pain Score 08/10/17 0024 0     Pain Loc --      Pain Edu? --      Excl. in GC? --     Constitutional: Alert and oriented. Well appearing and in no apparent distress. HEENT:      Head: Normocephalic and atraumatic.         Eyes: Conjunctivae are normal. Sclera is non-icteric.       Mouth/Throat: Mucous membranes are moist.       Neck: Supple with no signs of meningismus. Cardiovascular: Regular rate and rhythm. No murmurs, gallops, or rubs. 2+ symmetrical distal pulses are present in all extremities. No JVD. Respiratory: Normal respiratory effort. Lungs are clear to auscultation bilaterally. No wheezes, crackles, or rhonchi.  Gastrointestinal: Soft, non tender, and non distended with positive bowel sounds. No rebound or guarding. Musculoskeletal: Nontender with normal range of motion in all extremities. No edema, cyanosis, or erythema of extremities. Neurologic: Normal speech and language. Face is symmetric. Moving all extremities. No gross focal neurologic deficits are appreciated. Skin: Skin is warm, dry and intact. No rash noted. Psychiatric: Mood and affect are normal. Speech and behavior are normal.  ____________________________________________   LABS (all labs ordered are listed, but only abnormal results are displayed)  Labs Reviewed  COMPREHENSIVE METABOLIC PANEL - Abnormal;  Notable for the following components:      Result Value   Glucose, Bld 110 (*)    ALT 12 (*)    All other components within normal limits  ACETAMINOPHEN LEVEL - Abnormal; Notable for the following components:   Acetaminophen (Tylenol), Serum <10 (*)    All other components within normal limits  URINE DRUG SCREEN, QUALITATIVE ([facility] ONLY) - Abnormal; Notable for the following components:   Cannabinoid 50 Ng, Ur [place] POSITIVE (*)    All other components within normal limits  ETHANOL  SALICYLATE LEVEL  CBC   ____________________________________________  EKG  none  ____________________________________________  RADIOLOGY  none  ____________________________________________   PROCEDURES  Procedure(s) performed: None Procedures Critical Care performed:  None ____________________________________________   INITIAL IMPRESSION / ASSESSMENT AND PLAN /  ED COURSE   36 y.o. male with h/o seizures who presents IVC'ed by [employer] for SI. Patient denies current SI or prior h/o SI or attempts. Patient does endorse feeling sad during Thanksgiving about his cousin who committed suicide in July. Patient cooperative. Will consult telepsych and get basic labs for medical clearance.     _________________________ 3:03 AM on 08/10/2017 -----------------------------------------  Patient evaluated by Dr. Hermelinda MedicusAlvarado, telepsychiatrist and cleared for discharge. IVC lifted by Dr. Hermelinda MedicusAlvarado. Labs WNL. I do believe patient is safe for discharge at this time.    As part of my medical decision making, I reviewed the following data within the electronic MEDICAL RECORD NUMBER Nursing notes reviewed and incorporated, Labs reviewed , A consult was requested and obtained from this/these consultant(s) Psychiatry, Notes from prior ED visits and [place] Controlled Substance Database    Pertinent labs & imaging results that were available during my care of the patient were reviewed by me and considered in my medical decision making  (see chart for details).    ____________________________________________   FINAL CLINICAL IMPRESSION(S) / ED DIAGNOSES  Final diagnoses:  Bereavement      NEW MEDICATIONS STARTED DURING THIS VISIT:  ED Discharge Orders    None       Note:  This document was prepared using Dragon voice recognition software and may include unintentional dictation errors.    Nita SickleVeronese, [place], MD 08/10/17 [phone]

## 2017-08-10 NOTE — ED Triage Notes (Signed)
Pt arrives via [employer] as an IVC for suicidal threats. Pt states that he was down due to a cousin committing suicide in July and had a verbal altercation with his father. Pt denies SI intent or thoughts and is calm and cooperative at this time in triage.

## 2017-10-25 IMAGING — CT CT ABD-PELV W/ CM
1 of 2 series · 15 of 32 positions shown, 19 images · IV contrast (omnipaque)
Comparison: None.

CLINICAL DATA: Sharp abdominal and pelvic pain with bloody stools,
3 days duration. Elevated white count

EXAM:
CT ABDOMEN AND PELVIS WITH CONTRAST
TECHNIQUE: Multidetector CT imaging of the abdomen and pelvis was performed
using the standard protocol following bolus administration of
intravenous contrast.
CONTRAST:  100mL OMNIPAQUE IOHEXOL 300 MG/ML  SOLN

[Series 2: routine abd pel with · axial · 0.61mm/px · z∈[-1034,-614]mm · 15 of 92 slices shown, 19 images]
[im 4/92  soft-tissue]
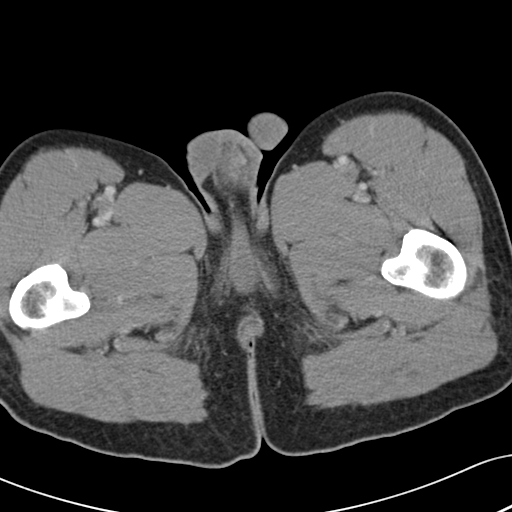
[im 4/92  bone]
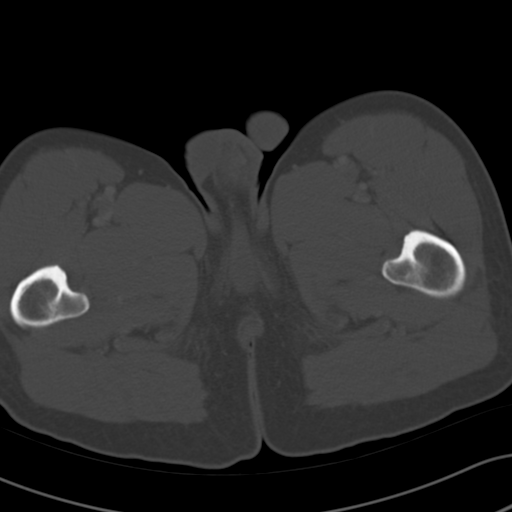
[im 12/92  soft-tissue]
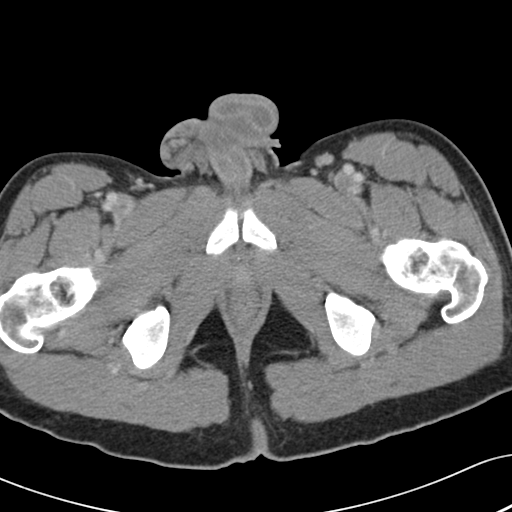
[im 19/92  soft-tissue]
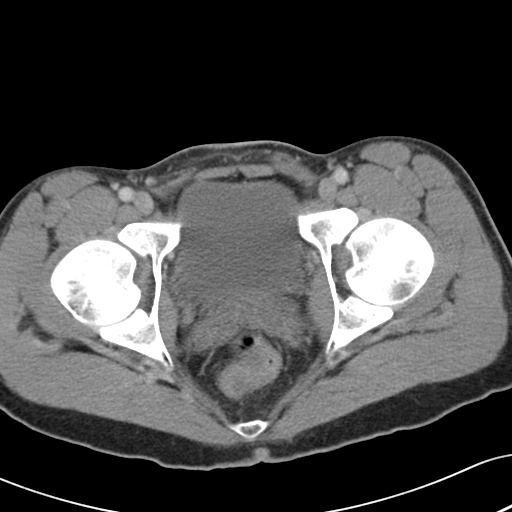
[im 27/92  soft-tissue]
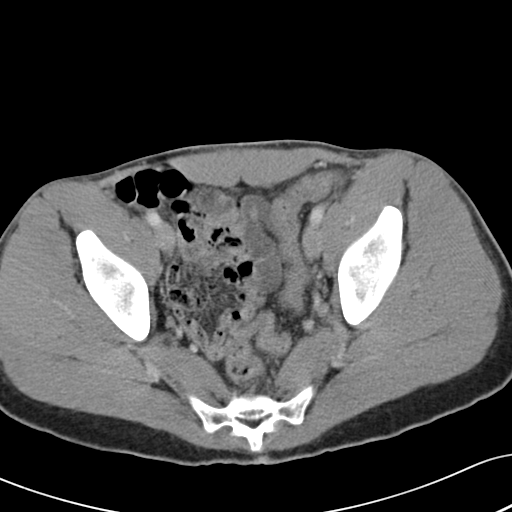
[im 31/92  soft-tissue]
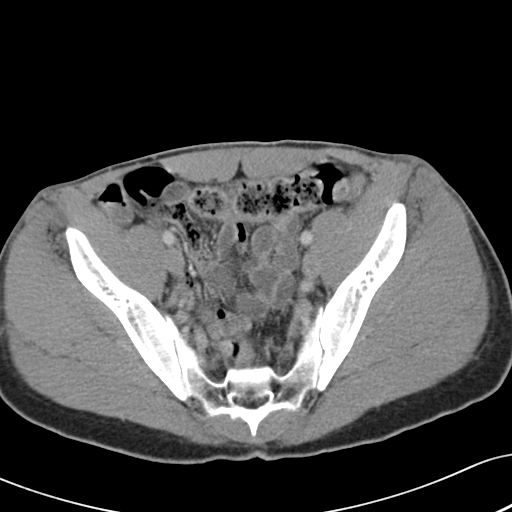
[im 38/92  soft-tissue]
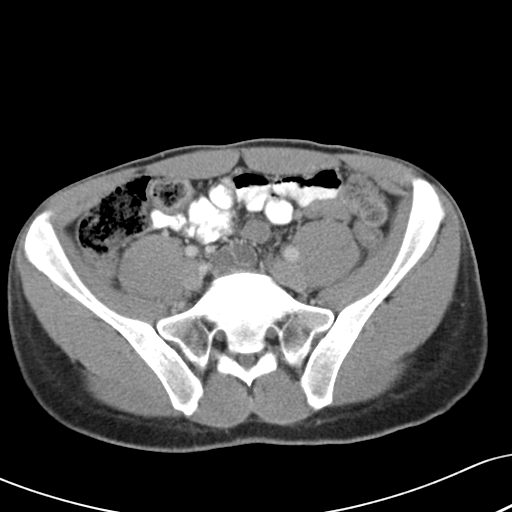
[im 46/92  soft-tissue]
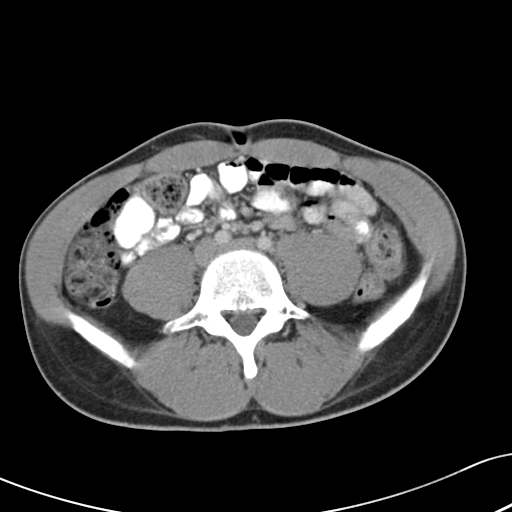
[im 54/92  soft-tissue]
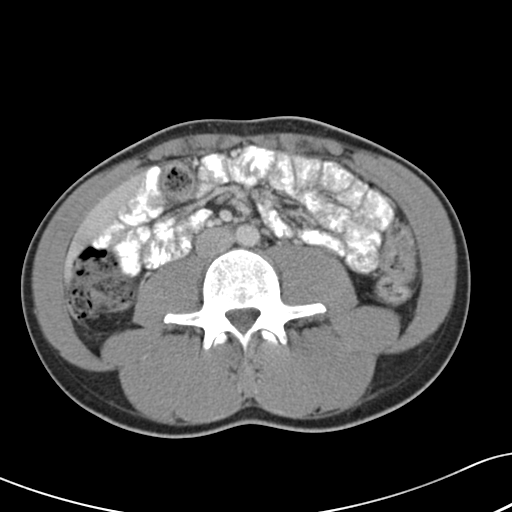
[im 61/92  soft-tissue]
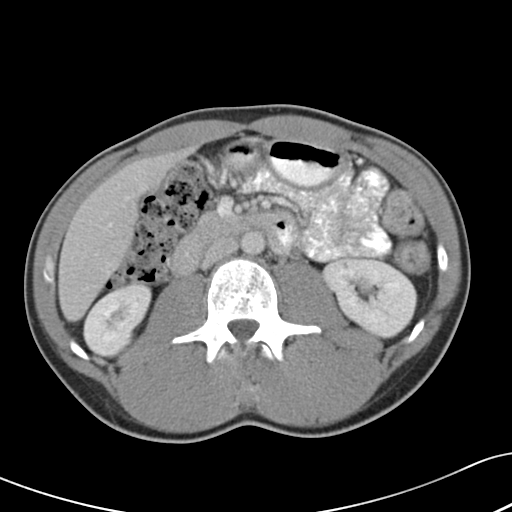
[im 61/92  bone]
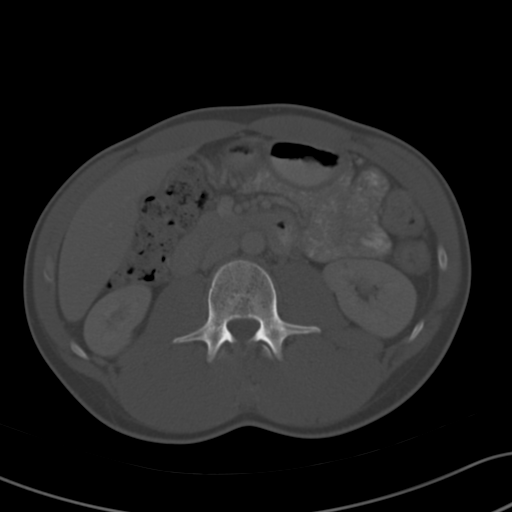
[im 65/92  soft-tissue]
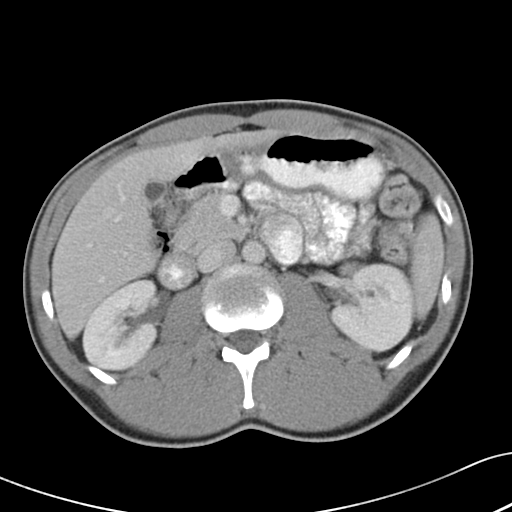
[im 73/92  soft-tissue]
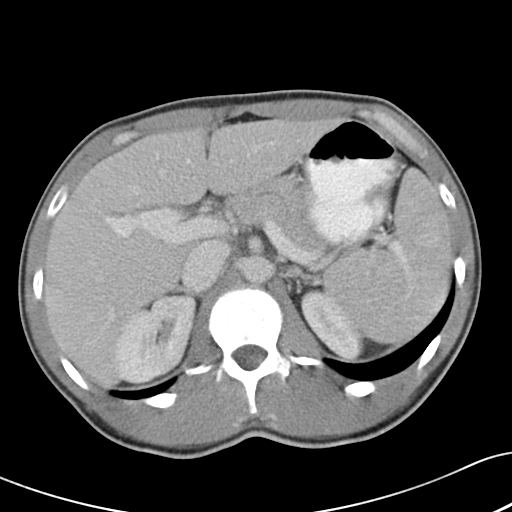
[im 76/92  lung]
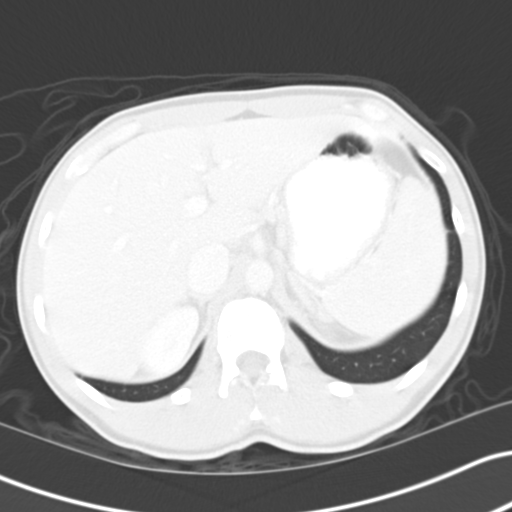
[im 80/92  soft-tissue]
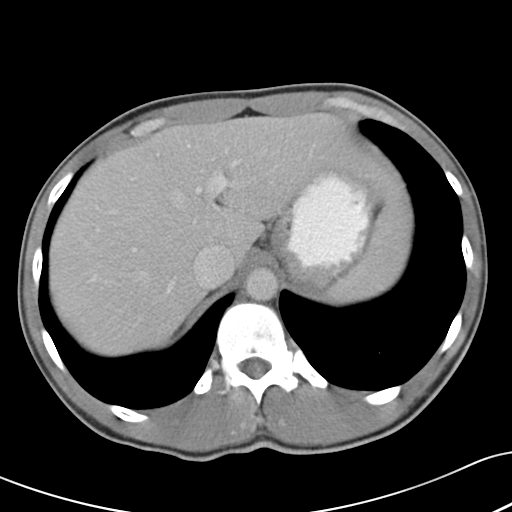
[im 80/92  lung]
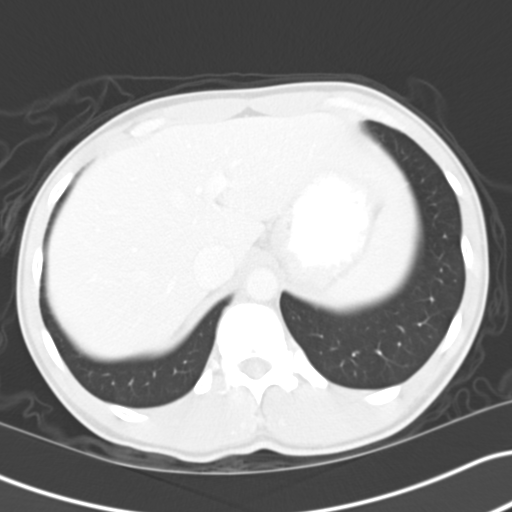
[im 84/92  lung]
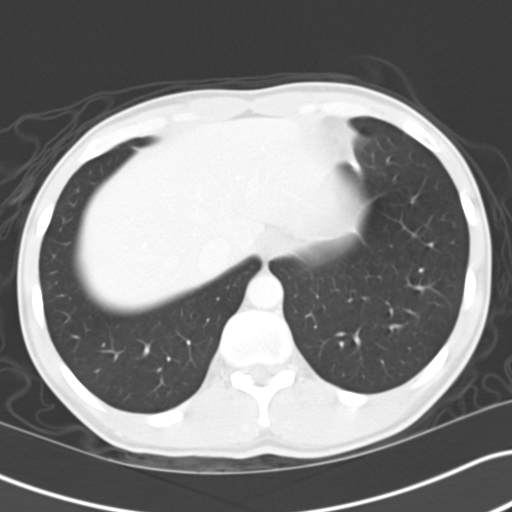
[im 88/92  soft-tissue]
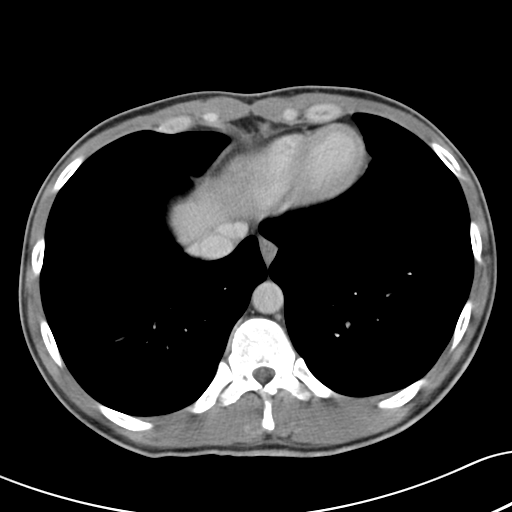
[im 88/92  lung]
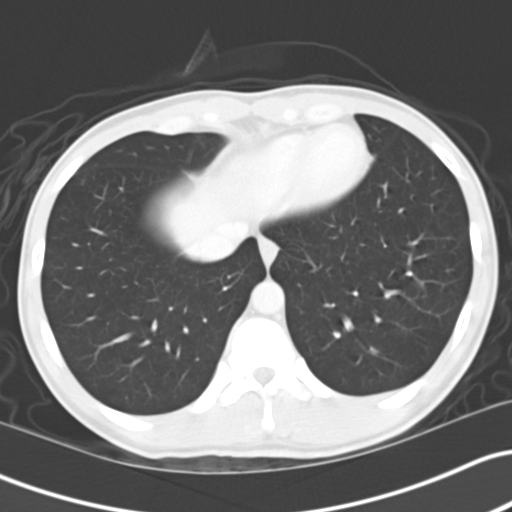

[15 of 32 positions shown; findings below may reference images not displayed]

FINDINGS: Lung bases are clear. No pleural or pericardial fluid. The liver is
normal. No calcified gallstones. The spleen is normal. The pancreas
is normal. The adrenal glands are normal. The kidneys are normal.
The aorta and IVC are normal. No retroperitoneal mass or
lymphadenopathy. No free intraperitoneal fluid or air.

No bowel pathology is identifiable by CT. No evidence of
inflammatory bowel disease or focal bowel lesion. No evidence of
diverticulitis. The appendix is normal.
IMPRESSION: Normal CT scan of the abdomen and pelvis. No abnormality seen to
explain the presenting symptoms.

## 2017-11-14 ENCOUNTER — Emergency Department
Admission: EM | Admit: 2017-11-14 | Discharge: 2017-11-14 | Disposition: A | Discharge status: Home / Self Care | Payer: Medicaid Other | Attending: Emergency Medicine | Admitting: Emergency Medicine

## 2017-11-14 ENCOUNTER — Other Ambulatory Visit: Payer: Self-pay

## 2017-11-14 DIAGNOSIS — Z79899 Other long term (current) drug therapy: Secondary | ICD-10-CM | POA: Insufficient documentation

## 2017-11-14 DIAGNOSIS — F819 Developmental disorder of scholastic skills, unspecified: Secondary | ICD-10-CM | POA: Insufficient documentation

## 2017-11-14 DIAGNOSIS — R569 Unspecified convulsions: Secondary | ICD-10-CM | POA: Insufficient documentation

## 2017-11-14 DIAGNOSIS — F17210 Nicotine dependence, cigarettes, uncomplicated: Secondary | ICD-10-CM | POA: Insufficient documentation

## 2017-11-14 LAB — URINE DRUG SCREEN, QUALITATIVE (ARMC ONLY)
AMPHETAMINES, UR SCREEN: NOT DETECTED
BENZODIAZEPINE, UR SCRN: NOT DETECTED
Barbiturates, Ur Screen: NOT DETECTED
Cannabinoid 50 Ng, Ur ~~LOC~~: POSITIVE — AB
Cocaine Metabolite,Ur ~~LOC~~: NOT DETECTED
MDMA (ECSTASY) UR SCREEN: NOT DETECTED
METHADONE SCREEN, URINE: NOT DETECTED
OPIATE, UR SCREEN: NOT DETECTED
Phencyclidine (PCP) Ur S: NOT DETECTED
Tricyclic, Ur Screen: NOT DETECTED

## 2017-11-14 LAB — BASIC METABOLIC PANEL
ANION GAP: 7 (ref 5–15)
BUN: 10 mg/dL (ref 6–20)
CALCIUM: 9.2 mg/dL (ref 8.9–10.3)
CHLORIDE: 107 mmol/L (ref 101–111)
CO2: 26 mmol/L (ref 22–32)
Creatinine, Ser: 1.07 mg/dL (ref 0.61–1.24)
GFR calc non Af Amer: >60 mL/min (ref >60)
GLUCOSE: 98 mg/dL (ref 65–99)
POTASSIUM: 3.8 mmol/L (ref 3.5–5.1)
Sodium: 140 mmol/L (ref 135–145)

## 2017-11-14 LAB — CBC WITH DIFFERENTIAL/PLATELET
BASOS ABS: 0 10*3/uL (ref 0–0.1)
BASOS PCT: 1 %
Eosinophils Absolute: 0.1 10*3/uL (ref 0–0.7)
Eosinophils Relative: 4 %
HEMATOCRIT: 41.2 % (ref 40.0–52.0)
HEMOGLOBIN: 14 g/dL (ref 13.0–18.0)
LYMPHS PCT: 33 %
Lymphs Abs: 1.3 10*3/uL (ref 1.0–3.6)
MCH: 29.3 pg (ref 26.0–34.0)
MCHC: 33.8 g/dL (ref 32.0–36.0)
MCV: 86.6 fL (ref 80.0–100.0)
Monocytes Absolute: 0.3 10*3/uL (ref 0.2–1.0)
Monocytes Relative: 8 %
NEUTROS ABS: 2.3 10*3/uL (ref 1.4–6.5)
NEUTROS PCT: 54 %
Platelets: 294 10*3/uL (ref 150–440)
RBC: 4.76 MIL/uL (ref 4.40–5.90)
RDW: 12.4 % (ref 11.5–14.5)
WBC: 4.1 10*3/uL (ref 3.8–10.6)

## 2017-11-14 LAB — VALPROIC ACID LEVEL

## 2017-11-14 MED ORDER — LEVETIRACETAM 750 MG PO TABS
750.0000 mg | ORAL_TABLET | Freq: Once | ORAL | Status: AC
Start: 2017-11-14 — End: 2017-11-14
  Administered 2017-11-14: 750 mg via ORAL
  Filled 2017-11-14 (×2): qty 1

## 2017-11-14 MED ORDER — DIVALPROEX SODIUM 500 MG PO DR TAB
500.0000 mg | DELAYED_RELEASE_TABLET | Freq: Once | ORAL | Status: AC
  Administered 2017-11-14: 500 mg via ORAL
  Filled 2017-11-14: qty 1

## 2017-11-14 NOTE — ED Provider Notes (Signed)
[facility] Emergency Department Provider Note   ____________________________________________   I have reviewed the triage vital signs and the nursing notes.   HISTORY  Chief Complaint Seizures   History limited by: Confusion   HPI Keith Bottomrnest C Gatton Jr. is a 37 y.o. male who presents to the emergency department today after apparent seizure-like activity.  Apparently this was unwitnessed.  Patient states that he has had a slight headache throughout the day.  He does have a history of seizure disorder but states he was told by his neurologist only take his seizure medications as needed.  Other than the throbbing headache he denies any other pain.  He was not incontinent on the scene.  Patient does state that he feels like he has had the flu recently with body aches and cold chills.   Per medical record review patient has a history of learning disorder, seizure disorder.   Past Medical History:  Diagnosis Date  . Learning disorder   . Seasonal allergies   . Seizures [facility](HCC)     Patient Active Problem List   Diagnosis Date Noted  . Right sided weakness 10/23/2016  . Subtherapeutic phenytoin level 10/23/2016  . Hypokalemia 10/23/2016  . Cannabis abuse 10/23/2016  . Toxic metabolic encephalopathy 09/26/2016  . Leukopenia 09/26/2016  . Seizure disorder (HCC) 09/26/2016  . Toxic encephalopathy 09/26/2016  . 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 Seizure (HCC) 04/18/2016  . Epilepsy (HCC) 10/17/2015    Past Surgical History:  Procedure Laterality Date  . HAND SURGERY     right hand surgery  . MULTIPLE TOOTH EXTRACTIONS  02/19/2017  . VASECTOMY Bilateral     Prior to Admission medications   Medication Sig Start Date End Date Taking? Authorizing Provider  divalproex (DEPAKOTE) 500 MG DR tablet Take 1 tablet (500 mg total) by mouth 2 (two) times daily. 10/23/16   Katharina CaperVaickute, Rima, MD  ibuprofen (ADVIL,MOTRIN) 600 MG tablet Take 600 mg by mouth every 6 (six)  hours as needed.    [provider]  lamoTRIgine (LAMICTAL) 100 MG tablet Take 100 mg by mouth 2 (two) times daily.     [provider]  lamoTRIgine (LAMICTAL) 25 MG tablet Take 25 mg by mouth 2 (two) times daily.     [provider]  levETIRAcetam (KEPPRA) 750 MG tablet Take 1 tablet (750 mg total) by mouth 2 (two) times daily. 10/23/16   Katharina CaperVaickute, Rima, MD  naproxen sodium (ANAPROX) 220 MG tablet Take 220 mg by mouth 2 (two) times daily with a meal.    [provider]  phenytoin (DILANTIN) 100 MG ER capsule Take 1 capsule (100 mg total) by mouth 3 (three) times daily. Patient not taking: Reported on 05/11/2015 10/14/14 05/24/15  [name], [name], MD    Allergies Pollen extract  Family History  Problem Relation Age of Onset  . Cancer Father        Unsure of type  . Hypertension Father   . Hypertension Mother   . Diabetes Mellitus II Maternal Grandmother     Social History Social History   Tobacco Use  . Smoking status: Current Every Day Smoker    Packs/day: 0.30    Types: Cigarettes    Last attempt to quit: 07/18/2014    Years since quitting: 3.3  . Smokeless tobacco: Never Used  . Tobacco comment: stopped 4 months ago  Substance Use Topics  . Alcohol use: Yes    Alcohol/week: 0.0 oz    Comment: socially  .  Drug use: Yes    Types: Marijuana    Comment: smoked yesterday    Review of Systems Constitutional: No fever/chills Eyes: No visual changes. ENT: No sore throat. Cardiovascular: Denies chest pain. Respiratory: Denies shortness of breath. Gastrointestinal: No abdominal pain.  No nausea, no vomiting.  No diarrhea.   Genitourinary: Negative for dysuria. Musculoskeletal: Negative for back pain. Skin: Negative for rash. Neurological: Positive for headache.  ____________________________________________   PHYSICAL EXAM:  VITAL SIGNS: ED Triage Vitals  Enc Vitals Group     BP 11/14/17 1552 (!) 149/106     Pulse Rate 11/14/17 1552  73     Resp 11/14/17 1552 18     Temp --      Temp src --      SpO2 11/14/17 1552 100 %     Weight 11/14/17 1552 165 lb (74.8 kg)     Height 11/14/17 1554 6\' 2"  (1.88 m)     Head Circumference --      Peak Flow --      Pain Score 11/14/17 1553 8   Constitutional: Awake and alert. Well appearing and in no distress. Eyes: Conjunctivae are normal.  ENT   Head: Normocephalic and atraumatic.   Nose: No congestion/rhinnorhea.   Mouth/Throat: Mucous membranes are moist.   Neck: No stridor. Hematological/Lymphatic/Immunilogical: No cervical lymphadenopathy. Cardiovascular: Normal rate, regular rhythm.  No murmurs, rubs, or gallops. Respiratory: Normal respiratory effort without tachypnea nor retractions. Breath sounds are clear and equal bilaterally. No wheezes/rales/rhonchi. Gastrointestinal: Soft and non tender. No rebound. No guarding.  Genitourinary: Deferred Musculoskeletal: Normal range of motion in all extremities. No lower extremity edema. Neurologic:  Normal speech and language. Some confusion. No gross focal neurologic deficits are appreciated.  Skin:  Skin is warm, dry and intact. No rash noted. Psychiatric: Mood and affect are normal. Speech and behavior are normal. Patient exhibits appropriate insight and judgment.  ____________________________________________    LABS (pertinent positives/negatives)  Valproic acid <10 BMP wnl CBC wnl UDS positive cannabinoid  ____________________________________________   EKG  None  ____________________________________________    RADIOLOGY  None  ____________________________________________   PROCEDURES  Procedures  ____________________________________________   INITIAL IMPRESSION / ASSESSMENT AND PLAN / ED COURSE  Pertinent labs & imaging results that were available during my care of the patient were reviewed by me and considered in my medical decision making (see chart for details).  Presented to  the emergency department today after a seizure.  Patient has a history of seizures.  Patient states that he has been taking his medications only on an as-needed basis and not regularly or scheduled.  Per last note of the neurologist he is supposed to take it on a scheduled basis.  I discussed this with the patient.  Will give patient dose of medication here in the emergency department.  Discussed importance of follow-up with neurology.  ____________________________________________   FINAL CLINICAL IMPRESSION(S) / ED DIAGNOSES  Final diagnoses:  Seizure [facility])     Note: This dictation was prepared with Dragon dictation. Any transcriptional errors that result from this process are unintentional     Phineas Semen, MD 11/14/17 1931

## 2017-11-14 NOTE — ED Triage Notes (Addendum)
Pt to ER via [facility] from home c/o seizure today. Hx of seizure, pt also reporting headache, dark blood clot today with BM. Pt takes seizure medications as needed per his report. Pt alert and oriented X4, active, cooperative, pt in NAD. RR even and unlabored, color WNL.    Mother is legal guardian, pt states that she is on the way.

## 2017-11-14 NOTE — Discharge Instructions (Signed)
Please seek medical attention for any high fevers, chest pain, shortness of breath, change in behavior, persistent vomiting, bloody stool or any other new or concerning symptoms.  

## 2017-11-14 NOTE — ED Notes (Signed)
  Pt left from room. This RN out to the fron of hospital and brought pt back to room to give meds. Spoke with mom on the phone who is his guardian. Pt denies guardian. Mom states pt knows how to take his meds. Explained that it had been stated he had a guardian so I needed to speak with her. Meds given and pt walked out to car by this RN and instructions given to mom.

## 2017-11-14 NOTE — ED Notes (Signed)
Report to Sherri, RN

## 2017-11-14 NOTE — ED Notes (Signed)
Attempted to call legal guardian, Silvio PateShelia, no answer-left message.

## 2018-05-01 ENCOUNTER — Emergency Department: Payer: Self-pay

## 2018-05-01 ENCOUNTER — Emergency Department
Admission: EM | Admit: 2018-05-01 | Discharge: 2018-05-02 | Disposition: A | Discharge status: Home / Self Care | Payer: Self-pay | Attending: Emergency Medicine | Admitting: Emergency Medicine

## 2018-05-01 DIAGNOSIS — Z79899 Other long term (current) drug therapy: Secondary | ICD-10-CM | POA: Insufficient documentation

## 2018-05-01 DIAGNOSIS — W228XXA Striking against or struck by other objects, initial encounter: Secondary | ICD-10-CM | POA: Insufficient documentation

## 2018-05-01 DIAGNOSIS — Y9389 Activity, other specified: Secondary | ICD-10-CM | POA: Insufficient documentation

## 2018-05-01 DIAGNOSIS — F17210 Nicotine dependence, cigarettes, uncomplicated: Secondary | ICD-10-CM | POA: Insufficient documentation

## 2018-05-01 DIAGNOSIS — Y990 Civilian activity done for income or pay: Secondary | ICD-10-CM | POA: Insufficient documentation

## 2018-05-01 DIAGNOSIS — Z23 Encounter for immunization: Secondary | ICD-10-CM | POA: Insufficient documentation

## 2018-05-01 DIAGNOSIS — Y929 Unspecified place or not applicable: Secondary | ICD-10-CM | POA: Insufficient documentation

## 2018-05-01 DIAGNOSIS — S41111A Laceration without foreign body of right upper arm, initial encounter: Secondary | ICD-10-CM | POA: Insufficient documentation

## 2018-05-01 MED ORDER — TETANUS-DIPHTH-ACELL PERTUSSIS 5-2.5-18.5 LF-MCG/0.5 IM SUSP
0.5000 mL | Freq: Once | INTRAMUSCULAR | Status: AC
  Administered 2018-05-02: 0.5 mL via INTRAMUSCULAR
  Filled 2018-05-01: qty 0.5

## 2018-05-01 MED ORDER — LIDOCAINE HCL (PF) 1 % IJ SOLN
5.0000 mL | Freq: Once | INTRAMUSCULAR | Status: AC
  Administered 2018-05-02: 5 mL
  Filled 2018-05-01: qty 5
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
## 2018-05-02 NOTE — Discharge Instructions (Addendum)
Please seek medical attention for any high fevers, chest pain, shortness of breath, change in behavior, persistent vomiting, bloody stool or any other new or concerning symptoms.  

## 2019-02-04 ENCOUNTER — Emergency Department
Admission: EM | Admit: 2019-02-04 | Discharge: 2019-02-04 | Disposition: A | Discharge status: Home / Self Care | Payer: Self-pay | Attending: Emergency Medicine | Admitting: Emergency Medicine

## 2019-02-04 DIAGNOSIS — Z634 Disappearance and death of family member: Secondary | ICD-10-CM | POA: Insufficient documentation

## 2019-02-04 DIAGNOSIS — Z79899 Other long term (current) drug therapy: Secondary | ICD-10-CM | POA: Insufficient documentation

## 2019-02-04 DIAGNOSIS — R451 Restlessness and agitation: Secondary | ICD-10-CM | POA: Insufficient documentation

## 2019-02-04 DIAGNOSIS — F4320 Adjustment disorder, unspecified: Secondary | ICD-10-CM | POA: Insufficient documentation

## 2019-02-04 DIAGNOSIS — F17210 Nicotine dependence, cigarettes, uncomplicated: Secondary | ICD-10-CM | POA: Insufficient documentation

## 2019-02-04 LAB — CBC WITH DIFFERENTIAL/PLATELET
Abs Immature Granulocytes: 0 10*3/uL (ref 0.00–0.07)
Basophils Absolute: 0.1 10*3/uL (ref 0.0–0.1)
Basophils Relative: 1 %
Eosinophils Absolute: 0.2 10*3/uL (ref 0.0–0.5)
Eosinophils Relative: 4 %
HCT: 42.3 % (ref 39.0–52.0)
Hemoglobin: 14.6 g/dL (ref 13.0–17.0)
Immature Granulocytes: 0 %
Lymphocytes Relative: 34 %
Lymphs Abs: 1.6 10*3/uL (ref 0.7–4.0)
MCH: 29.4 pg (ref 26.0–34.0)
MCHC: 34.5 g/dL (ref 30.0–36.0)
MCV: 85.1 fL (ref 80.0–100.0)
Monocytes Absolute: 0.4 10*3/uL (ref 0.1–1.0)
Monocytes Relative: 9 %
Neutro Abs: 2.5 10*3/uL (ref 1.7–7.7)
Neutrophils Relative %: 52 %
Platelets: 260 10*3/uL (ref 150–400)
RBC: 4.97 MIL/uL (ref 4.22–5.81)
RDW: 11.9 % (ref 11.5–15.5)
WBC: 4.8 10*3/uL (ref 4.0–10.5)
nRBC: 0 % (ref 0.0–0.2)

## 2019-02-04 LAB — ETHANOL: Alcohol, Ethyl (B): <10 mg/dL (ref <10)

## 2019-02-04 LAB — URINE DRUG SCREEN, QUALITATIVE (ARMC ONLY)
Amphetamines, Ur Screen: NOT DETECTED
Barbiturates, Ur Screen: NOT DETECTED
Benzodiazepine, Ur Scrn: NOT DETECTED
Cannabinoid 50 Ng, Ur ~~LOC~~: POSITIVE — AB
Cocaine Metabolite,Ur ~~LOC~~: NOT DETECTED
MDMA (Ecstasy)Ur Screen: NOT DETECTED
Methadone Scn, Ur: NOT DETECTED
Opiate, Ur Screen: NOT DETECTED
Phencyclidine (PCP) Ur S: NOT DETECTED
Tricyclic, Ur Screen: NOT DETECTED

## 2019-02-04 LAB — COMPREHENSIVE METABOLIC PANEL
ALT: 12 U/L (ref 0–44)
AST: 17 U/L (ref 15–41)
Albumin: 4.5 g/dL (ref 3.5–5.0)
Alkaline Phosphatase: 46 U/L (ref 38–126)
Anion gap: 7 (ref 5–15)
BUN: 10 mg/dL (ref 6–20)
CO2: 27 mmol/L (ref 22–32)
Calcium: 9.2 mg/dL (ref 8.9–10.3)
Chloride: 105 mmol/L (ref 98–111)
Creatinine, Ser: 1 mg/dL (ref 0.61–1.24)
GFR calc Af Amer: >60 mL/min (ref >60)
GFR calc non Af Amer: >60 mL/min (ref >60)
Glucose, Bld: 98 mg/dL (ref 70–99)
Potassium: 3.6 mmol/L (ref 3.5–5.1)
Sodium: 139 mmol/L (ref 135–145)
Total Bilirubin: 0.8 mg/dL (ref 0.3–1.2)
Total Protein: 7.2 g/dL (ref 6.5–8.1)

## 2019-02-04 LAB — SALICYLATE LEVEL: Salicylate Lvl: <7 mg/dL (ref 2.8–30.0)

## 2019-02-04 LAB — ACETAMINOPHEN LEVEL: Acetaminophen (Tylenol), Serum: <10 ug/mL — ABNORMAL LOW (ref 10–30)
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
## 2019-02-04 NOTE — ED Notes (Signed)
Patient is standing in doorway and is anxious to see the  Psych Doctor, but states that He does not need to be here, and we will be happy when He can leave, states that He will do out-patient if necessary,someone to talk to may help him, Patient is cooperative, no behavioral issues, will continue to monitor.

## 2019-02-04 NOTE — ED Notes (Signed)
Pt allowed this tech to collect blood sample and urine sample without difficulty. Pt cooperative at this time with no other needs voiced. Will continue to monitor Q49min rounds.

## 2019-02-04 NOTE — ED Notes (Signed)
Pt's belongings include:  Black backpack, black and white sneakers, black zip up hoodie, black polo, gray t-shirt, blue jeans, black belt, brown leather knife holder, and black and red shorts.

## 2019-02-04 NOTE — ED Notes (Signed)
Patient voices understanding of discharge instructions, no signs of distress, all belongings given back to Patient. [place] police is going to transport home.

## 2019-02-04 NOTE — ED Notes (Signed)
Reported to [facility] , and He is going to call Patient to do evaluation.

## 2019-02-04 NOTE — ED Notes (Signed)
Nurse talked with patient's mom Silvio Pate Snipes) legal guardian, nurse let mom know that He was being discharged back home.

## 2019-02-04 NOTE — ED Notes (Signed)
SOC in progress.
